# Patient Record
Sex: Female | Born: 1935 | Race: White | Hispanic: No | Smoking: Never smoker
Health system: Southern US, Community
[De-identification: ages and names within clinical notes are randomized; demographics above are authoritative.]

## PROBLEM LIST (undated history)

## (undated) DIAGNOSIS — M069 Rheumatoid arthritis, unspecified: Secondary | ICD-10-CM

## (undated) DIAGNOSIS — I340 Nonrheumatic mitral (valve) insufficiency: Secondary | ICD-10-CM

## (undated) DIAGNOSIS — I10 Essential (primary) hypertension: Secondary | ICD-10-CM

## (undated) DIAGNOSIS — I4819 Other persistent atrial fibrillation: Secondary | ICD-10-CM

## (undated) DIAGNOSIS — I5189 Other ill-defined heart diseases: Secondary | ICD-10-CM

## (undated) DIAGNOSIS — I421 Obstructive hypertrophic cardiomyopathy: Secondary | ICD-10-CM

## (undated) DIAGNOSIS — I5030 Unspecified diastolic (congestive) heart failure: Secondary | ICD-10-CM

## (undated) DIAGNOSIS — I517 Cardiomegaly: Secondary | ICD-10-CM

## (undated) DIAGNOSIS — K219 Gastro-esophageal reflux disease without esophagitis: Secondary | ICD-10-CM

## (undated) DIAGNOSIS — K829 Disease of gallbladder, unspecified: Secondary | ICD-10-CM

## (undated) DIAGNOSIS — G2581 Restless legs syndrome: Secondary | ICD-10-CM

## (undated) DIAGNOSIS — R42 Dizziness and giddiness: Secondary | ICD-10-CM

## (undated) DIAGNOSIS — K56609 Unspecified intestinal obstruction, unspecified as to partial versus complete obstruction: Secondary | ICD-10-CM

## (undated) HISTORY — DX: Essential (primary) hypertension: I10

## (undated) HISTORY — DX: Cardiomegaly: I51.7

## (undated) HISTORY — DX: Gastro-esophageal reflux disease without esophagitis: K21.9

## (undated) HISTORY — DX: Other persistent atrial fibrillation: I48.19

## (undated) HISTORY — DX: Disease of gallbladder, unspecified: K82.9

## (undated) HISTORY — DX: Dizziness and giddiness: R42

## (undated) HISTORY — PX: CHOLECYSTECTOMY: SHX55

## (undated) HISTORY — PX: APPENDECTOMY: SHX54

## (undated) HISTORY — PX: TOTAL HIP ARTHROPLASTY: SHX124

## (undated) HISTORY — DX: Nonrheumatic mitral (valve) insufficiency: I34.0

## (undated) HISTORY — DX: Unspecified diastolic (congestive) heart failure: I50.30

## (undated) HISTORY — DX: Restless legs syndrome: G25.81

## (undated) HISTORY — DX: Obstructive hypertrophic cardiomyopathy: I42.1

## (undated) HISTORY — PX: CATARACT EXTRACTION: SUR2

## (undated) HISTORY — PX: ABDOMINAL HYSTERECTOMY: SHX81

## (undated) HISTORY — DX: Other ill-defined heart diseases: I51.89

---

## 2000-01-12 ENCOUNTER — Ambulatory Visit (HOSPITAL_COMMUNITY): Admission: RE | Admit: 2000-01-12 | Discharge: 2000-01-12 | Payer: Self-pay | Admitting: Cardiology

## 2001-05-16 ENCOUNTER — Encounter: Payer: Self-pay | Admitting: Internal Medicine

## 2001-05-16 ENCOUNTER — Encounter: Admission: RE | Admit: 2001-05-16 | Discharge: 2001-05-16 | Payer: Self-pay | Admitting: Internal Medicine

## 2001-09-28 ENCOUNTER — Emergency Department (HOSPITAL_COMMUNITY): Admission: EM | Admit: 2001-09-28 | Discharge: 2001-09-29 | Payer: Self-pay | Admitting: Emergency Medicine

## 2001-09-28 ENCOUNTER — Encounter: Payer: Self-pay | Admitting: Emergency Medicine

## 2001-10-17 ENCOUNTER — Encounter: Admission: RE | Admit: 2001-10-17 | Discharge: 2001-10-17 | Payer: Self-pay | Admitting: Internal Medicine

## 2001-10-17 ENCOUNTER — Encounter: Payer: Self-pay | Admitting: Internal Medicine

## 2002-05-28 ENCOUNTER — Emergency Department (HOSPITAL_COMMUNITY): Admission: EM | Admit: 2002-05-28 | Discharge: 2002-05-28 | Payer: Self-pay | Admitting: Emergency Medicine

## 2002-06-08 ENCOUNTER — Ambulatory Visit (HOSPITAL_COMMUNITY)
Admission: RE | Admit: 2002-06-08 | Discharge: 2002-06-08 | Payer: Self-pay | Admitting: Physical Medicine and Rehabilitation

## 2002-06-08 ENCOUNTER — Encounter: Payer: Self-pay | Admitting: Physical Medicine and Rehabilitation

## 2002-06-19 ENCOUNTER — Ambulatory Visit (HOSPITAL_COMMUNITY): Admission: RE | Admit: 2002-06-19 | Discharge: 2002-06-19 | Payer: Self-pay | Admitting: Internal Medicine

## 2002-06-19 ENCOUNTER — Encounter: Payer: Self-pay | Admitting: Internal Medicine

## 2002-06-19 ENCOUNTER — Encounter (INDEPENDENT_AMBULATORY_CARE_PROVIDER_SITE_OTHER): Payer: Self-pay

## 2003-01-14 ENCOUNTER — Encounter: Payer: Self-pay | Admitting: Internal Medicine

## 2003-01-14 ENCOUNTER — Encounter: Admission: RE | Admit: 2003-01-14 | Discharge: 2003-01-14 | Payer: Self-pay | Admitting: Internal Medicine

## 2004-02-17 ENCOUNTER — Encounter: Admission: RE | Admit: 2004-02-17 | Discharge: 2004-02-17 | Payer: Self-pay | Admitting: Internal Medicine

## 2004-08-06 ENCOUNTER — Inpatient Hospital Stay (HOSPITAL_COMMUNITY): Admission: EM | Admit: 2004-08-06 | Discharge: 2004-08-12 | Payer: Self-pay | Admitting: Emergency Medicine

## 2004-08-07 ENCOUNTER — Ambulatory Visit: Payer: Self-pay | Admitting: Physical Medicine & Rehabilitation

## 2004-08-11 ENCOUNTER — Encounter: Payer: Self-pay | Admitting: Cardiovascular Disease

## 2004-08-24 ENCOUNTER — Ambulatory Visit (HOSPITAL_COMMUNITY): Admission: RE | Admit: 2004-08-24 | Discharge: 2004-08-24 | Payer: Self-pay | Admitting: Internal Medicine

## 2005-03-08 ENCOUNTER — Ambulatory Visit: Payer: Self-pay | Admitting: Gastroenterology

## 2005-03-09 ENCOUNTER — Encounter: Admission: RE | Admit: 2005-03-09 | Discharge: 2005-03-09 | Payer: Self-pay | Admitting: Internal Medicine

## 2005-03-19 ENCOUNTER — Ambulatory Visit: Payer: Self-pay | Admitting: Gastroenterology

## 2005-07-21 HISTORY — PX: US ECHOCARDIOGRAPHY: HXRAD669

## 2006-03-10 HISTORY — PX: CARDIOVASCULAR STRESS TEST: SHX262

## 2006-03-11 ENCOUNTER — Encounter: Admission: RE | Admit: 2006-03-11 | Discharge: 2006-03-11 | Payer: Self-pay | Admitting: Internal Medicine

## 2007-03-15 ENCOUNTER — Encounter: Admission: RE | Admit: 2007-03-15 | Discharge: 2007-03-15 | Payer: Self-pay | Admitting: Internal Medicine

## 2007-11-09 ENCOUNTER — Inpatient Hospital Stay (HOSPITAL_COMMUNITY): Admission: RE | Admit: 2007-11-09 | Discharge: 2007-11-12 | Payer: Self-pay | Admitting: Orthopaedic Surgery

## 2008-02-21 HISTORY — PX: US ECHOCARDIOGRAPHY: HXRAD669

## 2008-03-22 ENCOUNTER — Encounter: Admission: RE | Admit: 2008-03-22 | Discharge: 2008-03-22 | Payer: Self-pay | Admitting: Internal Medicine

## 2009-04-07 ENCOUNTER — Encounter: Admission: RE | Admit: 2009-04-07 | Discharge: 2009-04-07 | Payer: Self-pay | Admitting: Internal Medicine

## 2009-05-25 ENCOUNTER — Encounter: Admission: RE | Admit: 2009-05-25 | Discharge: 2009-05-25 | Payer: Self-pay | Admitting: Internal Medicine

## 2010-02-20 ENCOUNTER — Encounter: Payer: Self-pay | Admitting: Gastroenterology

## 2010-02-25 ENCOUNTER — Encounter (INDEPENDENT_AMBULATORY_CARE_PROVIDER_SITE_OTHER): Payer: Self-pay | Admitting: *Deleted

## 2010-03-26 ENCOUNTER — Encounter (INDEPENDENT_AMBULATORY_CARE_PROVIDER_SITE_OTHER): Payer: Self-pay | Admitting: *Deleted

## 2010-03-27 ENCOUNTER — Ambulatory Visit: Payer: Self-pay | Admitting: Gastroenterology

## 2010-04-10 ENCOUNTER — Ambulatory Visit: Payer: Self-pay | Admitting: Gastroenterology

## 2010-04-14 ENCOUNTER — Encounter: Payer: Self-pay | Admitting: Gastroenterology

## 2010-08-09 ENCOUNTER — Encounter: Payer: Self-pay | Admitting: Internal Medicine

## 2010-08-18 NOTE — Letter (Signed)
Summary: Colonoscopy Letter  [facility]  [address] [place], [place] [address]   Phone: [phone]  Fax: [phone]      February 20, 2010 MRN: [id number]   [facility] [address] [place], [place]  [address]   Dear Ms. Stacy [name],   According to your medical record, it is time for you to schedule a Colonoscopy. The American Cancer Society recommends this procedure as a method to detect early colon cancer. Patients with a family history of colon cancer, or a personal history of colon polyps or inflammatory bowel disease are at increased risk.  This letter has been generated based on the recommendations made at the time of your procedure. If you feel that in your particular situation this may no longer apply, please contact our office.  Please call our office at [phone] to schedule this appointment or to update your records at your earliest convenience.  Thank you for cooperating with [place] to provide you with the very best care possible.   Sincerely,  Rachael Fee, M.D.  [facility] Gastroenterology Division [phone]

## 2010-08-18 NOTE — Miscellaneous (Signed)
Summary: [facility] PV  Clinical Lists Changes  Medications: Added new medication of MOVIPREP 100 GM  SOLR (PEG-KCL-NACL-NASULF-NA ASC-C) As per prep instructions. - Signed Rx of MOVIPREP 100 GM  SOLR (PEG-KCL-NACL-NASULF-NA ASC-C) As per prep instructions.;  #1 x 0;  Signed;  Entered by: Durwin Glaze RN;  Authorized by: Rachael Fee MD;  Method used: Electronically to [facility] [address] [place], [place], [place]  [id number], Ph: [phone], Fax: [phone] Observations: Added new observation of NKA: T (03/27/2010 15:59)    Prescriptions: MOVIPREP 100 GM  SOLR (PEG-KCL-NACL-NASULF-NA ASC-C) As per prep instructions.  #1 x 0   Entered by:   Durwin Glaze RN   Authorized by:   Rachael Fee MD   Signed by:   Durwin Glaze RN on 03/27/2010   Method used:   Electronically to        [facility] (retail)       [address]       [place], [place]  [id number]       Ph: [phone]       Fax: [phone]   RxID:   [id number]

## 2010-08-18 NOTE — Procedures (Signed)
Summary: Colonoscopy  Patient: [name] Note: All result statuses are Final unless otherwise noted.  Tests: (1) Colonoscopy (COL)   COL Colonoscopy           DONE     [facility]     [address]     [place], [place]  [address]           COLONOSCOPY PROCEDURE REPORT           PATIENT:  Stacy [name], Stacy [name]  MR#:  [id number]     BIRTHDATE:  [birth year], 81 yrs. old  GENDER:  female     ENDOSCOPIST:  Rachael Fee, MD     PROCEDURE DATE:  04/10/2010     PROCEDURE:  Colonoscopy with snare polypectomy     ASA CLASS:  Class II     INDICATIONS:  Elevated Risk Screening, mother had colon cancer     MEDICATIONS:   Fentanyl 50 mcg IV, Versed 5 mg IV           DESCRIPTION OF PROCEDURE:   After the risks benefits and     alternatives of the procedure were thoroughly explained, informed     consent was obtained.  Digital rectal exam was performed and     revealed no rectal masses.   The LB PCF-[id number] endoscope     was introduced through the anus and advanced to the cecum, which     was identified by both the appendix and ileocecal valve, without     limitations.  The quality of the prep was adequate, using     MoviPrep.  The instrument was then slowly withdrawn as the colon     was fully examined.     <<PROCEDUREIMAGES>>     FINDINGS:  A diminutive polyp was found in the descending colon.     This was removed with cold snare and sent to pathology (jar 1)     (see image7 and image8).  Mild diverticulosis was found in the     sigmoid to descending colon segments (see image2).  External     hemorrhoids were found.  This was otherwise a normal examination     of the colon (see image4, image5, and image9).   Retroflexed views     in the rectum revealed no abnormalities.    The scope was then     withdrawn from the patient and the procedure completed.     COMPLICATIONS:  None           ENDOSCOPIC IMPRESSION:     1) Diminutive polyp in the descending colon, removed and  sent to     pathology     2) Mild diverticulosis in the sigmoid to descending colon     segments     3) External hemorrhoids     4) Otherwise normal examination           RECOMMENDATIONS:     1) Given your age, you will probably not need another     colonoscopy for colon cancer screening or polyp surveillance.     These types of tests usually stop around the age 49.     2) You will receive a letter within 1-2 weeks with the results     of your biopsy as well as final recommendations. Please call my     office if you have not received a letter after 3 weeks.           ______________________________  Rachael Fee, MD           n.     Rosalie Doctor:   Rachael Fee at 04/10/2010 11:50 AM           [place], [id number]  Note: An exclamation mark (!) indicates a result that was not dispersed into the flowsheet. Document Creation Date: 04/10/2010 11:51 AM _______________________________________________________________________  (1) Order result status: Final Collection or observation date-time: 04/10/2010 11:45 Requested date-time:  Receipt date-time:  Reported date-time:  Referring Physician:   Ordering Physician: Rob Bunting [phone]) Specimen Source:  Source: Launa Grill Order Number: [id number] Lab site:

## 2010-08-18 NOTE — Letter (Signed)
Summary: Colonoscopy Letter  [facility]  [address]   , [place] [address]   Phone: [phone]  Fax: [phone]      February 20, 2010 MRN: [id number]   [facility] [address] [place], [place]  [address]   Dear Ms. Nardelli,   According to your medical record, it is time for you to schedule a Colonoscopy. The American Cancer Society recommends this procedure as a method to detect early colon cancer. Patients with a family history of colon cancer, or a personal history of colon polyps or inflammatory bowel disease are at increased risk.  This letter has been generated based on the recommendations made at the time of your procedure. If you feel that in your particular situation this may no longer apply, please contact our office.  Please call our office at [phone] to schedule this appointment or to update your records at your earliest convenience.  Thank you for cooperating with us to provide you with the very best care possible.   Sincerely,  Daniel P. Jacobs, M.D.  [facility] Gastroenterology Division [phone] 

## 2010-08-18 NOTE — Letter (Signed)
Summary: Previsit letter  [facility]  [address] [place], [place] [address]   Phone: [phone]  Fax: [phone]       02/25/2010 MRN: [id number]  [facility] [address] [place], [place]  [address]  Dear Stacy [name],  Welcome to the Gastroenterology Division at [facility].    You are scheduled to see a nurse for your pre-procedure visit on 03-27-10 at 4:30p.m. on the 3rd floor at [facility], [address]  We ask that you try to arrive at our office 15 minutes prior to your appointment time to allow for check-in.  Your nurse visit will consist of discussing your medical and surgical history, your immediate family medical history, and your medications.    Please bring a complete list of all your medications or, if you prefer, bring the medication bottles and we will list them.  We will need to be aware of both prescribed and over the counter drugs.  We will need to know exact dosage information as well.  If you are on blood thinners (Coumadin, Plavix, Aggrenox, Ticlid, etc.) please call our office today/prior to your appointment, as we need to consult with your physician about holding your medication.   Please be prepared to read and sign documents such as consent forms, a financial agreement, and acknowledgement forms.  If necessary, and with your consent, a friend or relative is welcome to sit-in on the nurse visit with you.  Please bring your insurance card so that we may make a copy of it.  If your insurance requires a referral to see a specialist, please bring your referral form from your primary care physician.  No co-pay is required for this nurse visit.     If you cannot keep your appointment, please call [phone] to cancel or reschedule prior to your appointment date.  This allows [place] the opportunity to schedule an appointment for another patient in need of care.    Thank you for choosing [facility] for your medical needs.  We  appreciate the opportunity to care for you.  Please visit [place] at our website  to learn more about our practice.                     Sincerely.                                                                                                                   The Gastroenterology Division

## 2010-08-18 NOTE — Letter (Signed)
Summary: Results Letter  [facility]  [address] [place], [place] [address]   Phone: [phone]  Fax: [phone]        April 14, 2010 MRN: [id number]    [facility] [address] [place], [place]  [address]    Dear Stacy [name],   At least one of the polyps removed during your recent procedure was proven to be adenomatous.  Usually, routine screening/surveillance colonoscopy would be recommended to be done in 5 years (you would be 86 at that point).  However since colon cancer screening tests generally stop around age 32, I will leave it to you and your primary care physician to contact my office at that time if it is felt that colon cancer screening is still an important issue for you.  Please call if you have any questions or concerns.       Sincerely,  Rachael Fee MD  This letter has been electronically signed by your physician.  Appended Document: Results Letter letter mailed

## 2010-08-18 NOTE — Letter (Signed)
Summary: Moviprep Instructions  [facility]  [address]   [place], [place] [address]   Phone: [phone]  Fax: [phone]       Stacy [name]    19-May-1929    MRN: [id number]        Procedure Day Dorna Bloom: Friday, 04-10-10     Arrival Time: 10:30 a.m.      Procedure Time: 11:30 a.m.     Location of Procedure:                    x    Endoscopy Center (4th Floor)   PREPARATION FOR COLONOSCOPY WITH MOVIPREP   Starting 5 days prior to your procedure 04-05-10  do not eat nuts, seeds, popcorn, corn, beans, peas,  salads, or any raw vegetables.  Do not take any fiber supplements (e.g. Metamucil, Citrucel, and Benefiber).  THE DAY BEFORE YOUR PROCEDURE         DATE: 04-09-10  DAY: Thursday  1.  Drink clear liquids the entire day-NO SOLID FOOD  2.  Do not drink anything colored red or purple.  Avoid juices with pulp.  No orange juice.  3.  Drink at least 64 oz. (8 glasses) of fluid/clear liquids during the day to prevent dehydration and help the prep work efficiently.  CLEAR LIQUIDS INCLUDE: Water Jello Ice Popsicles Tea (sugar ok, no milk/cream) Powdered fruit flavored drinks Coffee (sugar ok, no milk/cream) Gatorade Juice: apple, white grape, white cranberry  Lemonade Clear bullion, consomm, broth Carbonated beverages (any kind) Strained chicken noodle soup Hard Candy                             4.  In the morning, mix first dose of MoviPrep solution:    Empty 1 Pouch A and 1 Pouch B into the disposable container    Add lukewarm drinking water to the top line of the container. Mix to dissolve    Refrigerate (mixed solution should be used within 24 hrs)  5.  Begin drinking the prep at 5:00 p.m. The MoviPrep container is divided by 4 marks.   Every 15 minutes drink the solution down to the next mark (approximately 8 oz) until the full liter is complete.   6.  Follow completed prep with 16 oz of clear liquid of your choice (Nothing red or  purple).  Continue to drink clear liquids until bedtime.  7.  Before going to bed, mix second dose of MoviPrep solution:    Empty 1 Pouch A and 1 Pouch B into the disposable container    Add lukewarm drinking water to the top line of the container. Mix to dissolve    Refrigerate  THE DAY OF YOUR PROCEDURE      DATE: 04-10-10  DAY: Friday  Beginning at 6:30 a.m. (5 hours before procedure):         1. Every 15 minutes, drink the solution down to the next mark (approx 8 oz) until the full liter is complete.  2. Follow completed prep with 16 oz. of clear liquid of your choice.    3. You may drink clear liquids until 9:30AM (2 HOURS BEFORE PROCEDURE).   MEDICATION INSTRUCTIONS  Unless otherwise instructed, you should take regular prescription medications with a small sip of water   as early as possible the morning of your procedure.  Additional medication instructions: DO NOT TAKE HCTZ ON THE DAY OF YOUR PROCEDURE.  OTHER INSTRUCTIONS  You will need a responsible adult at least 75 years of age to accompany you and drive you home.   This person must remain in the waiting room during your procedure.  Wear loose fitting clothing that is easily removed.  Leave jewelry and other valuables at home.  However, you may wish to bring a book to read or  an iPod/MP3 player to listen to music as you wait for your procedure to start.  Remove all body piercing jewelry and leave at home.  Total time from sign-in until discharge is approximately 2-3 hours.  You should go home directly after your procedure and rest.  You can resume normal activities the  day after your procedure.  The day of your procedure you should not:   Drive   Make legal decisions   Operate machinery   Drink alcohol   Return to work  You will receive specific instructions about eating, activities and medications before you leave.    The above instructions have been reviewed and explained to me by    Durwin Glaze RN  March 27, 2010 4:33 PM    I fully understand and can verbalize these instructions _____________________________ Date _________

## 2010-12-01 NOTE — Op Note (Signed)
NAMESAREN, CORKERN NO.:  [id number]   MEDICAL RECORD NO.:  [id number]          PATIENT TYPE:  INP   LOCATION:  2550                         FACILITY:  [facility]   PHYSICIAN:  Lubertha Basque. Dalldorf, M.D.DATE OF BIRTH:  [birth year]   DATE OF PROCEDURE:  11/09/2007  DATE OF DISCHARGE:                               OPERATIVE REPORT   PREOPERATIVE DIAGNOSIS:  Painful left hip hemiarthroplasty.   POSTOPERATIVE DIAGNOSIS:  Painful left hip hemiarthroplasty.   PROCEDURE:  1. Left hip revision.  2. Total hip replacement.   ANESTHESIA:  General.   ATTENDING SURGEON:  Lubertha Basque. Jerl Santos, MD   ASSISTANT:  Lindwood Qua, PA   INDICATIONS FOR PROCEDURE:  The patient is a 77 year old woman 3 years  from hemiarthroplasty for hip fracture.  She did well initially but over  the last year developed groin pain.  On x-ray, she has acetabular wear,  though the femoral component is well fixed.  She is offered conversion  to total hip replacement as she has pain, which limits her ability to  remain active and pain, which limits her ability to rest.  Informed  operative consent was obtained after discussion of possible  complications of reaction to anesthesia, infection, DVT, PE,  dislocation, and death.   SUMMARY, FINDINGS AND PROCEDURE:  Under general anesthesia through her  old posterior approach, a left hip hemiarthroplasty was uncovered.  The  femoral component was surrounded by thick scar tissue but once this was  all evacuated, the component itself appeared well fixed.  The acetabulum  did exhibit significant wear down to bone with no articular cartilage  remaining.  This was then converted to an ASR hip replacement by DePuy  using a size 52 ASR cup with a 46-mm large head with a +2 neck buildup.  Lindwood Qua assisted throughout and was invaluable to the  completion of the case in that he helped position and retract while I  performed the procedure.  He also closed  simultaneously to help minimize  the OR time.   DESCRIPTION OF THE PROCEDURE:  The patient was taken to the operating  suite where general anesthetic was applied without difficulty.  She was  positioned on lateral decubitus position with the left hip up.  All bony  prominences were appropriately padded, hip positioners were utilized,  and an axillary roll was placed.  She was prepped and draped in normal  sterile fashion.  After administration of IV Kefzol, a posterior  approach was taken to the left hip.  All appropriate anti-infective  measures were used including Betadine-impregnated drape, preoperative IV  antibiotic, and closed hooded exhaust systems for each member of the  surgical team.  I dissected down through an abundance of the adipose  tissue to the IT band and gluteus maximus fascia.  These structures were  split longitudinally to expose the short external rotators of the hip,  which were tagged and reflected.  I dissected through an abundance of  old scar tissue to expose for hemiarthroplasty component.  This appeared  well fixed in the femur.  I removed the head assembly.  We continued to  dissect around the acetabulum to remove residual labral tissues and scar  tissue.  I then reamed from 43 up to size 51 followed by trial  reductions.  We then placed a size 52 ASR cup and appropriate  anteversion and slightly vertical position as this seemed to give [place]  access to fixation.  We then placed a trial 46-head with a +2 neck and  this seemed to give [place] good stability in flexion, internal rotation, as  well as extension, and external rotation.  Leg lengths were judged, to  be roughly equal.  The trial component was removed followed by placement  of a size 46-head with a +2 sleeve.  Again, this was stable once  reduced.  The wound was thoroughly irrigated followed by reapproximation  of short external rotators to the greater trochanteric region with  nonabsorbable suture.  IT  band and gluteus maximus fascia were  reapproximated with #1 Vicryl in interrupted fashion followed by  subcutaneous reapproximation in several layers with 0 and 2-0 undyed  Vicryl.  Skin was closed with staples.  Adaptic was applied followed by  a Mepilex dressing.   ESTIMATED BLOOD LOSS:  100 mL.   INTRAOPERATIVE FLUIDS:  Obtained from anesthesia records.   DISPOSITION:  The patient was extubated in the operating room and taken  to recovery in stable addition.  She will be admitted to the Orthopedic  Surgery Service for appropriate postop care to include perioperative  antibiotics and Coumadin plus Lovenox for DVT prophylaxis.      Lubertha Basque Jerl Santos, M.D.  Electronically Signed     PGD/MEDQ  D:  11/09/2007  T:  11/10/2007  Job:  [id number]

## 2010-12-04 NOTE — Discharge Summary (Signed)
Stacy [name], Stacy [name] NO.:  [id number]   MEDICAL RECORD NO.:  [id number]          PATIENT TYPE:  INP   LOCATION:  5014                         FACILITY:  [facility]   PHYSICIAN:  Stacy [name], M.D.DATE OF BIRTH:  [birth year]   DATE OF ADMISSION:  08/06/2004  DATE OF DISCHARGE:  08/12/2004                                 DISCHARGE SUMMARY   ADMITTING DIAGNOSES:  1.  Left hip femoral neck fracture.  2.  History of hypertension.   DISCHARGE DIAGNOSES:  1.  Left hip femoral neck fracture.  2.  History of hypertension.  3.  Blood loss anemia.   BRIEF HISTORY:  Stacy [name] is a 77 year old white female who fell the day of  admission to the hospital, was unable to get up.  Was transported to the  emergency room and Dr. Ignacia [name] had called [place] to consult on her.  She had a  femoral neck fracture by x-ray.  She was noted to be a very active person,  independent ambulator.  Discussed treatment options with her, that being a  left hip cemented hemiarthroplasty.  Risks of anesthesia, infection, DVT,  and possible death were discussed with her.   PERTINENT LABORATORY AND X-RAY FINDINGS:  Chest showed some increased  basilar atelectasis.  Hip x-rays showed a subcapital femoral neck fracture  on the left side.  EKG:  Normal sinus rhythm with a first degree AV block.  Her hemoglobin was 8.3, hematocrit 24.0, platelets at 245.  Last INR 1.5.  Sodium 138, potassium 4.0, glucose at 103, BUN at 5, calcium 7.8, creatinine  0.6.  Blood cultures taken no growth.  Urine specimen did show Enterococcus  greater than 100,000 colonies.   HOSPITAL COURSE:  She was put on a morphine PCA pump, IV Ancef 1 g q.8h. x3  doses, laxative of choice, Coumadin and Lovenox protocol for DVT  prophylaxis, Phenergan for nausea, Percocet for p.o. medication.  She was  kept on Altace 10 mg one a day, Actonel one pill a week, and then folic  acid.  She also was on incentive spirometry, knee high TEDS,  Foley catheter  to straight drain, knee immobilizer on while at nighttime in bed, and then  physical therapy, occupational therapy, and a rehabilitation consult were  done.  She had follow-up CBC, BMET, and pro times while in the hospital and  she could be weightbearing as tolerated.  The first day postoperative her  blood pressure was 110/50.  Hemoglobin was 10.6, INR 1.0.  Lungs were clear  to A&P.  Abdomen was soft, positive bowel sounds.  Left hip wound was  normal.  No sign of infection.  Foley catheter was in place and her dressing  was dry.  The second day postoperative her dressing was changed.  Her wound  was benign.  No sign of irritation or infection.  Normal neurovascular  status to her toes.  Temperature was 102.8.  Blood cultures were done and  discussed in the laboratory section of this dictation as well as chest x-ray  and EKG.  Consultation was done as well from Dr. Felipa Eth  because of her  temperature elevation and thought that it possibly was from sinusitis.  During the additional hospital stay her blood pressure was 109/64, heart  rate of 82.  Temperature dropped to 98.2.  Hemoglobin 8.1.  INR was 1.2.  Lungs continued to be clear.  Cardiac S1 and S2.  Abdomen soft with positive  bowel sounds.  She was started on some ferrous sulfate 325 one a day.  Blood  loss we felt was due to surgery and from blood loss anemia.  She was  asymptomatic so we would hold on any transfusions at this time.  Her hip  wound at all times at the hospitalization was benign.  No sign of irritation  and infection and no drainage noted.  Her Foley catheter was discontinued.  She progressed well and was discharged home.   CONDITION ON DISCHARGE:  Improved.   FOLLOW-UP:  She will be on Percocet one or two every four to six hours,  Coumadin 5 mg and this is dosed as assigned by pharmacy, Altace 10 mg one a  day, Avelox 400 mg for three more days, Nu-Iron one pill a day.  She was  also prescribed home  health care for pro times and therapy for weightbearing  as tolerated.  Dressing can be changed daily.  Diet was unrestricted.  Any  sign of infection, drainage, increasing pain, redness to call [phone] and  she will follow up with [place] in about two weeks.      MC/MEDQ  D:  10/08/2004  T:  10/08/2004  Job:  [id number]

## 2010-12-04 NOTE — Consult Note (Signed)
Stacy [name], REEDER NO.:  [id number]   MEDICAL RECORD NO.:  [id number]          PATIENT TYPE:  INP   LOCATION:  5014                         FACILITY:  [facility]   PHYSICIAN:  Larina Earthly, M.D.        DATE OF BIRTH:  Nov 10, 1928   DATE OF CONSULTATION:  08/08/2004  DATE OF DISCHARGE:                                   CONSULTATION   REASON FOR CONSULTATION:  Fever and medical management of multiple issues.   HISTORY OF PRESENT ILLNESS:  This is a 77 year old Caucasian female who has  a past medical history significant for osteoporosis, hypertension, and  psoriatic arthritis followed by Dr. Jimmy Footman and Dr. Rodrigo Ran of [facility], who fell on August 06, 2004, without loss of  consciousness and suffered a left hip fracture.  She was taken to the OR by  Dr. Jerl Santos and underwent hemiarthroplasty without known complications.  This morning, the patient was noted to have fevers throughout the night with  chills, cough productive of blood tinged sputum without shortness of breath,  chest pain, nausea and vomiting, and I was called to consult on the patient  given these new acute issues.  Of note, she is on DVT prophylaxis with  Coumadin per pharmacy protocol.  Upon further questioning by myself, the  patient states that she has had a sore throat since the surgery along with  post nasal drip and rhinitis of  yellow to clear discharge.  She is  maintained on Methotrexate on a weekly basis along with her Actonel for her  osteoporosis and psoriatic arthritis.  She does not have any history of  smoking or tobacco abuse.  Other review of systems is negative for nausea,  vomiting, diarrhea, change in bowel habits, blood per rectum, or urine.  She  denies any suprapubic discomfort with Foley catheter in place and states  that her pain is adequately controlled.  The patient states that since  receiving Tylenol and Percocet this morning, the patient's chills  have  resolved and she is feeling quite better compared with earlier this morning  when evaluated by orthopedic surgery.   PROBLEM LIST:  1.  Hypertension.  2.  Psoriatic arthritis.  3.  Osteoporosis.   SOCIAL HISTORY:  The patient lives with her husband and is supported by a  daughter who lives in town, as well.  She denies any tobacco or alcohol  abuse and works as a Lawyer.   FAMILY HISTORY:  Noncontributory at this point.   CURRENT MEDICATIONS:  Actonel, Altace 10 mg daily, hydrochlorothiazide 25 mg  daily, Premarin 0.3 mg daily, methotrexate q. Saturday night, and folic acid  daily.   ALLERGIES:  The patient has no known drug allergies.   LABORATORY DATA:  White blood cell count 8.6, unchanged from prior to  hospitalization, hemoglobin 9.9 decreased from 12.6 prior to surgery,  hematocrit 29.2, platelet count 164.  Sodium 131, potassium 3.4, BUN 10,  creatinine 0.7, serum bicarb 28, glucose 117.  Blood cultures x 2 pending.  Urine culture and urinalysis pending.  Portable chest x-ray  pending,  however, preoperative chest x-ray was unremarkable for any acute disease.   PHYSICAL EXAMINATION:  GENERAL:  Pleasant Caucasian female sitting in a chair in no apparent  distress, answered all questions appropriately, alert and oriented x 3, in  no respiratory distress.  VITAL SIGNS:  Blood pressure 124/63, heart rate 95, respirations 20 and  nonlabored, temperature previously 102.8 degrees this morning, oxygen  saturation 94% on 2 liters by nasal cannula.  HEENT:  Sclerae anicteric.  Extraocular movements intact.  There are no  oropharyngeal lesions with the exception of some post nasal drip.  NECK:  Supple, there is no sinus tenderness, there is no cervical  lymphadenopathy.  LUNGS:  Coarse breath sounds at the bases but are otherwise clear.  There is  a notable murmur at the left upper back region.  HEART:  Regular rate and rhythm with a harsh systolic murmur.   ABDOMEN:  Soft, nontender, nondistended abdomen, bowel sounds present, an  audible bruit is also present possibly emanating from the chest.  EXTREMITIES:  No edema, pedal pulses are intact.   ASSESSMENT/PLAN:  1.  Fever.  Given symptomatology of post nasal drip and sore throat and      sinus pressure, this certainly may be sinusitis, will treat empirically      with mucolytic agents and Avelox, will follow up on chest x-ray and      blood cultures.  Please note that the white blood cell count is normal      and stable at this point.  2.  Hypertension, stable and controlled.  3.  Anemia, hemoglobin 9.9, not in need of transfusion at this time but will      continue to monitor status post surgery, will presume that this is      secondary to blood loss from the surgery.  4.  Psoriatic arthritis, stable on methotrexate and folic acid.  5.  Heart murmur with radiation to the back and abdomen.  After extensive      discussion with the family and patient, this has been present in the      past and has been evaluated by Dr. Judie Grieve and Dr. Waynard Edwards with a history      of an echocardiogram within the last few years, will defer management to      Dr. Waynard Edwards, if additional evaluation is needed in the near future.  6.  The patient is on DVT prophylaxis.      RA/MEDQ  D:  08/08/2004  T:  08/08/2004  Job:  [id number]   cc:   Lubertha Basque. Jerl Santos, M.D.  [address]  [place]  [place] [address]  Fax: [phone]   Redge Gainer. Waynard Edwards, M.D.  [address]  [place]  [place] [address]  Fax: [phone]

## 2010-12-04 NOTE — Op Note (Signed)
NAMEMONZERAT, HANDLER NO.:  [id number]   MEDICAL RECORD NO.:  [id number]          PATIENT TYPE:  INP   LOCATION:  1826                         FACILITY:  [facility]   PHYSICIAN:  Lubertha Basque. Dalldorf, M.D.DATE OF BIRTH:  01-17-1929   DATE OF PROCEDURE:  08/06/2004  DATE OF DISCHARGE:                                 OPERATIVE REPORT   PREOPERATIVE DIAGNOSIS:  Left hip displaced femoral neck fracture.   POSTOPERATIVE DIAGNOSIS:  Left hip displaced femoral neck fracture.   PROCEDURE:  Left hip hemiarthroplasty.   ANESTHESIA:  General.   ATTENDING SURGEON:  Lubertha Basque. Jerl Santos, M.D.   ASSISTANT:  Lindwood Qua, P.A.   INDICATION FOR PROCEDURE:  The patient is a 77 year old woman who has is  independent-liver and ambulator who fell today and fractured her left  femoral neck. She is offered hemiarthroplasty in hopes of regaining some  mobility. Informed operative consent was obtained after discussion of  possible complications of reaction to anesthesia, infection, DVT,  dislocation, PE, and death.   DESCRIPTION OF PROCEDURE:  The the patient was taken to the operating suite  where general anesthetic was applied without difficulty. She positioned in a  lateral decubitus position. All bony prominences were appropriately padded,  an axillary roll placed, and hip positioners utilized. She was then prepped  and draped in normal sterile fashion. After the administration with preop IV  antibiotics, a posterior approach was taken to the left hip. Dissection was  carried through a moderate amount of adipose tissue to expose the IT band  and gluteus maximus fascia, which were incised longitudinally. This exposed  the short external rotators of the hip, which were tagged and reflected. A  partial posterior capsulectomy was performed. A revision femoral neck cut  was made and the femoral head was removed. The head sized to a 47. The femur  was exposed and broached up to size 3  after appropriate reaming. A trial  reduction was done with several components and the -347 assembly seemed to  be best. This was stable in extension with external rotation and flexion.  With internal rotation, the leg lengths were judged to be roughly equal. The  trial components removed followed by placement of a distal cement plug, size  3, by [employer]. The canal was thoroughly irrigated with pulsatile lavage,  followed by pressurization of cement and placement of a size 3 DePuy Excel  femoral stem in appropriate anteversion. Excess cement was trimmed and  pressure was held on the component until the cement hardened completely.  This was then topped with a 47 -3 unipolar head assembly. The hip again was  reduced and was stable in extension with external rotation and flexion with  internal rotation. The capsule and short external rotators of the hip were  reapproximated to the greater trochanteric region with nonabsorbable suture.  The wound was irrigated several times, followed by reapproximation of fascia  lata and IT band in interrupted fashion with #1 Vicryl. Subcutaneous tissues  were reapproximated in 3 layers with Vicryl, followed by skin closure with  staples. Adaptic was placed on the wound,  followed by dry gauze and tape.  Estimated blood loss and intraoperative fluids can be obtained from  anesthesia records.   DISPOSITION:  The patient was extubated in the operating room and taken to  recovery in stable addition. Plans were for her to be admitted to the  orthopedic surgery service for appropriate postop care to include  perioperative antibiotics and Coumadin plus Lovenox for DVT prophylaxis.      PGD/MEDQ  D:  08/06/2004  T:  08/07/2004  Job:  [id number]

## 2010-12-04 NOTE — Discharge Summary (Signed)
Stacy [name], HORAN NO.:  [id number]   MEDICAL RECORD NO.:  [id number]          PATIENT TYPE:  INP   LOCATION:  5004                         FACILITY:  [facility]   PHYSICIAN:  Stacy Basque. Dalldorf, M.D.DATE OF BIRTH:  05/14/1929   DATE OF ADMISSION:  11/09/2007  DATE OF DISCHARGE:  11/12/2007                               DISCHARGE SUMMARY   ADMITTING DIAGNOSES:  1. Left hip hemiarthroplasty with painful acetabulum.  2. Hypertension.  3. Esophageal reflux.   DISCHARGE DIAGNOSES:  1. Left hip hemiarthroplasty with painful acetabulum.  2. Hypertension.  3. Esophageal reflux.   OPERATION:  Conversion of the left hemiarthroplasty hip to total hip  replacement and acetabular component replacement.   BRIEF HISTORY:  Stacy [name] is a 77 year old white female patient well-  known to our practice who a number of years ago had suffered a hip  fracture and underwent a hemiarthroplasty on the left side.  During the  last number of months, she has had increasing pain in the groin and x-  rays reveal acetabular wear.  We have discussed converting her to a  total hip replacement which would be revising her hip arthroplasty with  an acetabular component.   PERTINENT LABORATORY AND X-RAY FINDINGS:  EKG sinus bradycardia.  WBC  7.7, hemoglobin 9.3, hematocrit 27.8, platelets 169.  Serial INRs were  done as she was on low-dose Coumadin protocol.  Sodium 136, potassium  3.7, glucose 108, BUN 10 and creatinine 0.65.   COURSE IN THE HOSPITAL:  She was admitted postoperatively, placed on  variety of p.o. and IM analgesics for pain.  PCA pump was used for DVT  prophylaxis with help of pharmacy for Coumadin and Lovenox, IV Ancef 1 g  q.8 h. x3 doses and then appropriate pain medications by mouth, iron  supplementation, antiemetics, knee-high TEDs, incentive spirometry,  Foley catheter used for first 24 hours and discontinued.  Physical  therapy, she can be out of bed, weightbearing as  tolerated with a  therapy and OT consults as well. The first day postop, blood pressure  107/53, temperature 97, hemoglobin 10.8, INR 1.0.  Positive breath  sounds in all fields.  Abdomen, soft.  Left hip wound within normal  limits as infection.  Foley catheter was discontinued.  IV was changed  to heparin lock and on the second day postop, dressing was changed.  The  wound was noted to be benign.  IVs were discontinued.  She progressed  well, showed no significant difficulties while in the hospital and was  discharged home.   CONDITION ON DISCHARGE:  Improved.   FOLLOW UP:  She will remain on Exforge 5/160 1 mg a day, omeprazole 20  mg a day, folic acid, methotrexate 2.5 mg a day, vitamin D 50,000 units  a day, Actonel 35 mg weekly.  Prescription for pain medications is  given.  Percocet one every 4 to 6 p.r.n. pain and Coumadin as directed  by pharmacy.  She could change her dressing on a daily basis.  Eat a low-  sodium heart-healthy diet.  Home physical therapy would be provided  along with home draws  for her INRs.  She will return and that is provided by [place]  She  will return to see Dr. Jerl [name] in 7-10 days days, calling [phone] for  an appointment in the office and then the same number if there is any  sign of infection, increasing redness, drainage, pain.      Stacy [name], P.A.      Stacy [name], M.D.  Electronically Signed    MC/MEDQ  D:  11/30/2007  T:  12/01/2007  Job:  [id number]

## 2010-12-15 ENCOUNTER — Ambulatory Visit (INDEPENDENT_AMBULATORY_CARE_PROVIDER_SITE_OTHER): Payer: Medicare Other | Admitting: Gastroenterology

## 2010-12-15 ENCOUNTER — Other Ambulatory Visit (INDEPENDENT_AMBULATORY_CARE_PROVIDER_SITE_OTHER): Payer: Medicare Other

## 2010-12-15 ENCOUNTER — Encounter: Payer: Self-pay | Admitting: Gastroenterology

## 2010-12-15 ENCOUNTER — Ambulatory Visit
Admission: RE | Admit: 2010-12-15 | Discharge: 2010-12-15 | Disposition: A | Discharge status: Home / Self Care | Payer: Medicare Other | Source: Ambulatory Visit | Attending: Internal Medicine | Admitting: Internal Medicine

## 2010-12-15 ENCOUNTER — Other Ambulatory Visit: Payer: Self-pay | Admitting: Internal Medicine

## 2010-12-15 VITALS — BP 120/48 | HR 68 | Ht 62.0 in | Wt 149.0 lb

## 2010-12-15 DIAGNOSIS — E041 Nontoxic single thyroid nodule: Secondary | ICD-10-CM

## 2010-12-15 DIAGNOSIS — R197 Diarrhea, unspecified: Secondary | ICD-10-CM

## 2010-12-15 LAB — CBC WITH DIFFERENTIAL/PLATELET
Basophils Relative: 0.6 % (ref 0.0–3.0)
Eosinophils Absolute: 0.1 10*3/uL (ref 0.0–0.7)
MCHC: 34 g/dL (ref 30.0–36.0)
MCV: 90.8 fl (ref 78.0–100.0)
Monocytes Absolute: 0.5 10*3/uL (ref 0.1–1.0)
Neutrophils Relative %: 40.4 % — ABNORMAL LOW (ref 43.0–77.0)
Platelets: 178 10*3/uL (ref 150.0–400.0)
RBC: 3.79 Mil/uL — ABNORMAL LOW (ref 3.87–5.11)

## 2010-12-15 LAB — COMPREHENSIVE METABOLIC PANEL
AST: 20 U/L (ref 0–37)
Albumin: 3.9 g/dL (ref 3.5–5.2)
Alkaline Phosphatase: 50 U/L (ref 39–117)
BUN: 22 mg/dL (ref 6–23)
Potassium: 4.3 mEq/L (ref 3.5–5.1)
Total Bilirubin: 0.4 mg/dL (ref 0.3–1.2)

## 2010-12-15 MED ORDER — METRONIDAZOLE 250 MG PO TABS: 250.0000 mg | ORAL_TABLET | Freq: Three times a day (TID) | ORAL | Status: AC

## 2010-12-15 NOTE — Patient Instructions (Addendum)
Trial of antibiotics, flagyl (take one pill three times a day) called into your pharmacy. You will have labs checked today in the basement lab.  Please head down after you check out with the front desk  (c diff by pcr, stool for ova parasites, fecal leuks, routine culture, CBC, CMET). A copy of this information will be made available to Dr. Waynard Edwards. Please call Dr. Christella Hartigan' office if you are not improving within 3-4 days of starting the abx.

## 2010-12-15 NOTE — Progress Notes (Signed)
Review of pertinent gastrointestinal problems: 1. family history of colon cancer, mother 2. personal history of adenomatous colon polyps, colonoscopy September 2011. Also noted diverticulosis, hemorrhoids, small tubular adenoma. No recommended surveillance colonoscopy given her age, would have been 87 at the time of repeat colonoscopy  HPI: This is a  very pleasant 77 year old woman whom I last saw the time of a colonoscopy about a year ago. See those results summarized above.  She feels like she has IBS.  She has loose stools, usually 4 per day.  Generally after eating.  No overt bleeding.  The loose stools started 3-4 weeks ago.   She has had nocturnal diarrhea as well.  She takes no nsaids.  She takes prilosec (for many years) for GERD related problems.  Uncomfortable in lower abd, until she has  A BM.  No abx recently.  No NH visits, no sick contacts.  Very gassy lately.    Review of systems: Pertinent positive and negative review of systems were noted in the above HPI section.  All other review of systems was otherwise negative.   Past Medical History, Past Surgical History, Family History, Social History, Current Medications, Allergies were all reviewed with the patient via [facility] electronic medical record system.   Physical Exam: BP 120/48  Pulse 68  Ht 5\' 2"  (1.575 m)  Wt 149 lb (67.586 kg)  BMI 27.25 kg/m2 Constitutional: generally well-appearing Psychiatric: alert and oriented x3 Eyes: extraocular movements intact Mouth: oral pharynx moist, no lesions Neck: supple no lymphadenopathy Cardiovascular: heart regular rate and rhythm Lungs: clear to auscultation bilaterally Abdomen: soft, nontender, nondistended, no obvious ascites, no peritoneal signs, normal bowel sounds Extremities: no lower extremity edema bilaterally Skin: no lesions on visible extremities    Assessment and plan: 77 y.o. female with change in bowel habits, acute diarrhea  She has had loose  stools, several a day including nocturnally for 3-4 weeks. She has no risk factors for Clostridium difficile. She's going to get a set of labs today including stool tests, CBC, complete metabolic profile. I am going to put her empirically on metronidazole 3 times daily. She is to call if she is not improving after 3-4 days.

## 2010-12-16 ENCOUNTER — Other Ambulatory Visit: Payer: Medicare Other

## 2010-12-16 DIAGNOSIS — R197 Diarrhea, unspecified: Secondary | ICD-10-CM

## 2010-12-17 LAB — OVA AND PARASITE SCREEN: OP: NONE SEEN

## 2010-12-20 LAB — STOOL CULTURE

## 2010-12-23 ENCOUNTER — Other Ambulatory Visit: Payer: Self-pay | Admitting: Internal Medicine

## 2010-12-23 DIAGNOSIS — E041 Nontoxic single thyroid nodule: Secondary | ICD-10-CM

## 2010-12-29 ENCOUNTER — Ambulatory Visit
Admission: RE | Admit: 2010-12-29 | Discharge: 2010-12-29 | Disposition: A | Discharge status: Home / Self Care | Payer: Medicare Other | Source: Ambulatory Visit | Attending: Internal Medicine | Admitting: Internal Medicine

## 2010-12-29 ENCOUNTER — Other Ambulatory Visit: Payer: Self-pay | Admitting: Internal Medicine

## 2010-12-29 ENCOUNTER — Other Ambulatory Visit (HOSPITAL_COMMUNITY)
Admission: RE | Admit: 2010-12-29 | Discharge: 2010-12-29 | Disposition: A | Discharge status: Home / Self Care | Payer: Medicare Other | Source: Ambulatory Visit | Attending: Interventional Radiology | Admitting: Interventional Radiology

## 2010-12-29 ENCOUNTER — Other Ambulatory Visit: Payer: Self-pay | Admitting: Interventional Radiology

## 2010-12-29 DIAGNOSIS — E041 Nontoxic single thyroid nodule: Secondary | ICD-10-CM

## 2010-12-29 DIAGNOSIS — E049 Nontoxic goiter, unspecified: Secondary | ICD-10-CM | POA: Insufficient documentation

## 2011-01-14 ENCOUNTER — Ambulatory Visit
Admission: RE | Admit: 2011-01-14 | Discharge: 2011-01-14 | Disposition: A | Discharge status: Home / Self Care | Payer: Medicare Other | Source: Ambulatory Visit | Attending: Internal Medicine | Admitting: Internal Medicine

## 2011-01-14 ENCOUNTER — Other Ambulatory Visit: Payer: Self-pay | Admitting: Internal Medicine

## 2011-01-14 DIAGNOSIS — R221 Localized swelling, mass and lump, neck: Secondary | ICD-10-CM

## 2011-03-29 ENCOUNTER — Ambulatory Visit (INDEPENDENT_AMBULATORY_CARE_PROVIDER_SITE_OTHER): Payer: Medicare Other | Admitting: *Deleted

## 2011-03-29 DIAGNOSIS — M79609 Pain in unspecified limb: Secondary | ICD-10-CM

## 2011-04-13 LAB — BASIC METABOLIC PANEL
BUN: 10
BUN: 15
BUN: 19
CO2: 29
Calcium: 8.1 — ABNORMAL LOW
Calcium: 8.4
Calcium: 8.7
Chloride: 103
Chloride: 104
Creatinine, Ser: 0.65
Creatinine, Ser: 0.75
Creatinine, Ser: 0.86
GFR calc Af Amer: >60
GFR calc Af Amer: >60
GFR calc non Af Amer: >60
GFR calc non Af Amer: >60
GFR calc non Af Amer: >60
Glucose, Bld: 108 — ABNORMAL HIGH
Glucose, Bld: 143 — ABNORMAL HIGH
Sodium: 136

## 2011-04-13 LAB — BODY FLUID CULTURE
Culture: NO GROWTH
Gram Stain: NONE SEEN

## 2011-04-13 LAB — CBC
Hemoglobin: 10.8 — ABNORMAL LOW
MCHC: 33.9
MCHC: 34.5
MCV: 92.4
MCV: 93.1
Platelets: 169
Platelets: 171
Platelets: 214
Platelets: 216
RBC: 3.88
RDW: 14.5
RDW: 14.6
WBC: 6.3
WBC: 7.1
WBC: 7.7

## 2011-04-13 LAB — PROTIME-INR
INR: 1
INR: 1
INR: 1.3
Prothrombin Time: 12.8
Prothrombin Time: 13.8

## 2011-04-13 LAB — ANAEROBIC CULTURE: Gram Stain: NONE SEEN

## 2011-04-14 NOTE — Procedures (Unsigned)
DUPLEX DEEP VENOUS EXAM - LOWER EXTREMITY  INDICATION:  Right lower extremity pain.  HISTORY:  Edema:  No. Trauma/Surgery: Pain:  Yes, lateral calf. PE:  No. Previous DVT:  No. Anticoagulants:  No. Other:  DUPLEX EXAM:               CFV   SFV   PopV  PTV    GSV               R  L  R  L  R  L  R   L  R  L Thrombosis    o  o  o     o     o      o Spontaneous   +  +  +     +     +      + Phasic        +  +  +     +     +      + Augmentation  +  +  +     +     +      + Compressible  +  +  +     +     +      + Competent     +  +  +     +     +      +  Legend:  + - yes  o - no  p - partial  D - decreased  IMPRESSION: 1. No evidence of deep venous thrombosis was noted. 2. Preliminary results were called to [facility] K[facility] Drinkard.   _____________________________ Di Kindle. Edilia Bo, M.D.  EM/MEDQ  D:  03/29/2011  T:  03/29/2011  Job:  [id number]

## 2011-05-11 ENCOUNTER — Other Ambulatory Visit: Payer: Self-pay | Admitting: *Deleted

## 2011-05-11 MED ORDER — METOPROLOL SUCCINATE ER 50 MG PO TB24: 25.0000 mg | ORAL_TABLET | Freq: Two times a day (BID) | ORAL | Status: DC

## 2011-05-11 NOTE — Telephone Encounter (Signed)
Fax Received. Refill Completed. Stacy [name] (M.A). Pt needs appointment then refill can be made  

## 2011-05-27 DIAGNOSIS — Z96649 Presence of unspecified artificial hip joint: Secondary | ICD-10-CM | POA: Insufficient documentation

## 2011-06-01 ENCOUNTER — Encounter: Payer: Self-pay | Admitting: Cardiovascular Disease

## 2011-06-02 ENCOUNTER — Ambulatory Visit (INDEPENDENT_AMBULATORY_CARE_PROVIDER_SITE_OTHER): Payer: Medicare Other | Admitting: Cardiovascular Disease

## 2011-06-02 ENCOUNTER — Encounter: Payer: Self-pay | Admitting: Cardiovascular Disease

## 2011-06-02 DIAGNOSIS — E785 Hyperlipidemia, unspecified: Secondary | ICD-10-CM

## 2011-06-02 DIAGNOSIS — I10 Essential (primary) hypertension: Secondary | ICD-10-CM

## 2011-06-02 DIAGNOSIS — I517 Cardiomegaly: Secondary | ICD-10-CM

## 2011-06-02 DIAGNOSIS — I119 Hypertensive heart disease without heart failure: Secondary | ICD-10-CM | POA: Insufficient documentation

## 2011-06-02 NOTE — Assessment & Plan Note (Addendum)
Her blood pressure remains well controlled. She seems to be doing fine on the low-dose Toprol and Exforge.  I'll see her again in one year.

## 2011-06-02 NOTE — Progress Notes (Signed)
  Tyson Babinski Date of Birth  Jul 17, 1929 [facility] [address]    [address] [place], [place]  [address] [phone]  Fax  [phone]  History of Present Illness:  Stacy [name] is doing fairly well from a cardiac standpoint. She's not had any cardiac complaints. She was found to have hyperlipidemia recently and started on simvastatin.  Current Outpatient Prescriptions on File Prior to Visit  Medication Sig Dispense Refill  . amLODipine-valsartan (EXFORGE) 5-160 MG per tablet Take 1 tablet by mouth daily.        . ergocalciferol (VITAMIN D2) 50000 UNITS capsule Take 50,000 Units by mouth once a week.        . fish oil-omega-3 fatty acids 1000 MG capsule Take 2 g by mouth daily. 4 per day        . folic acid (FOLVITE) 1 MG tablet Take 1 mg by mouth 3 (three) times daily.       . hydrochlorothiazide 25 MG tablet Take 25 mg by mouth daily.        . methotrexate (RHEUMATREX) 2.5 MG tablet Take 2.5 mg by mouth once a week. Caution:Chemotherapy. Protect from light.       . metoprolol (TOPROL-XL) 50 MG 24 hr tablet Take 0.5 tablets (25 mg total) by mouth 2 (two) times daily.  60 tablet  0  . omeprazole (PRILOSEC) 20 MG capsule Take 20 mg by mouth daily.        . simvastatin (ZOCOR) 20 MG tablet Take 20 mg by mouth at bedtime.          No Known Allergies  Past Medical History  Diagnosis Date  . Hypertension   . GERD (gastroesophageal reflux disease)   . LVH (left ventricular hypertrophy)     WITH ASYMMETRIC SEPTAL HYPERTROPHY  . Diastolic dysfunction   . Mitral regurgitation   . Gallbladder disease     Past Surgical History  Procedure Date  . Total hip arthroplasty   . US echocardiography 02/21/2008    EF 55-60%  . US echocardiography 07/21/2005    EF 55-60%  . Cardiovascular stress test 03/10/2006    EF 69%. NO EVIDENCE OF ISCHEMIA    History  Smoking status  . Never Smoker   Smokeless tobacco  . Not on file    History  Alcohol Use No    Family History    Problem Relation Age of Onset  . Hypertension Mother   . Stroke Mother   . Leukemia Father   . Heart attack Brother     Reviw of Systems:  Reviewed in the HPI.  All other systems are negative.  Physical Exam: BP 140/68  Pulse 64  Resp 18  Ht 5\' 2"  (1.575 m)  Wt 151 lb (68.493 kg)  BMI 27.62 kg/m2 The patient is alert and oriented x 3.  The mood and affect are normal.   Skin: warm and dry.  Color is normal.    HEENT:    Normocephalic/atraumatic. Her carotids ar normal. Her neck is supple. There is no JVD.  Lungs: Her lung exam is clear.   Heart: Regular, S1-S2. She has a 2/6 systolic ejection murmur at the left sternal border.    Abdomen: Shows good bowel sounds. There is no hepatosplenomegaly.  Extremities:  No clubbing cyanosis or edema.  Neuro:  Her neuro exam is nonfocal.    ECG: Sinus bradycardia with a first degree AV block. Otherwise her EKG is normal.  Assessment / Plan:

## 2011-06-02 NOTE — Assessment & Plan Note (Signed)
She has a mild left ventricular outflow tract obstruction. This is being treated fairly well with Toprol. She does still have a soft murmur. She's not having any symptoms.

## 2011-06-02 NOTE — Assessment & Plan Note (Signed)
She was recently found to have hypercholesterolemia. She started on simvastatin. There is some potential interaction between simvastatin and amlodipine. I would be inclined to put her on atorvastatin which has fewer drug/drug interactions. Will discuss this issue again when I see her again in 6 months.

## 2011-06-02 NOTE — Patient Instructions (Addendum)
Your physician wants you to follow-up in: 12 months You will receive a reminder letter in the mail two months in advance. If you don't receive a letter, please call our office to schedule the follow-up appointment.  Your physician recommends that you return for a FASTING lipid profile: 12 months

## 2011-06-28 DIAGNOSIS — M25519 Pain in unspecified shoulder: Secondary | ICD-10-CM | POA: Insufficient documentation

## 2011-06-28 DIAGNOSIS — M75100 Unspecified rotator cuff tear or rupture of unspecified shoulder, not specified as traumatic: Secondary | ICD-10-CM | POA: Insufficient documentation

## 2011-07-15 ENCOUNTER — Other Ambulatory Visit: Payer: Self-pay | Admitting: *Deleted

## 2011-07-15 MED ORDER — METOPROLOL SUCCINATE ER 50 MG PO TB24: 25.0000 mg | ORAL_TABLET | Freq: Two times a day (BID) | ORAL | Status: DC

## 2011-08-27 ENCOUNTER — Ambulatory Visit (INDEPENDENT_AMBULATORY_CARE_PROVIDER_SITE_OTHER): Payer: Medicare Other | Admitting: Cardiovascular Disease

## 2011-08-27 ENCOUNTER — Encounter: Payer: Self-pay | Admitting: Cardiovascular Disease

## 2011-08-27 DIAGNOSIS — I10 Essential (primary) hypertension: Secondary | ICD-10-CM

## 2011-08-27 DIAGNOSIS — I517 Cardiomegaly: Secondary | ICD-10-CM

## 2011-08-27 DIAGNOSIS — E785 Hyperlipidemia, unspecified: Secondary | ICD-10-CM

## 2011-08-27 MED ORDER — ATORVASTATIN CALCIUM 10 MG PO TABS: 10.0000 mg | ORAL_TABLET | Freq: Every day | ORAL | Status: DC

## 2011-08-27 MED ORDER — METOPROLOL SUCCINATE ER 50 MG PO TB24: 25.0000 mg | ORAL_TABLET | Freq: Two times a day (BID) | ORAL | Status: DC

## 2011-08-27 NOTE — Assessment & Plan Note (Signed)
We will discontinue her simvastatin and start her on atorvastatin 10 mg a day. We'll check a fasting lipids again in 3 months and again in 6 months we'll see her again for an office visit.

## 2011-08-27 NOTE — Assessment & Plan Note (Signed)
She has a very slight mid LVOT obstruction. This is stable. Her pulses are quite good. We'll continue with the same dose of metoprolol.

## 2011-08-27 NOTE — Patient Instructions (Signed)
Your physician wants you to follow-up in: 6 MONTHS You will receive a reminder letter in the mail two months in advance. If you don't receive a letter, please call our office to schedule the follow-up appointment.   Your physician recommends that you return for a FASTING lipid profile: 3 MONTHS   Your physician has recommended you make the following change in your medication:   STOP SIMVASTATIN START ATORVASTATIN

## 2011-08-27 NOTE — Assessment & Plan Note (Signed)
Her blood pressure is fairly well controlled.

## 2011-08-27 NOTE — Progress Notes (Signed)
Tyson Babinski Date of Birth  08/08/1928 [facility]     [place] Office  [address]    [address]   [address] [place], [place]  [address]    [place], [place]  [address] [phone]  Fax  [phone]  [phone]  Fax [phone]  Problems: 1. 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 Hyperlipidemia 3. Mitral regurgitation   History of Present Illness:  Mrs. Baldridge is done very well from a cardiac standpoint. She's not had any episodes of chest pain or shortness of breath. She's had a cough and bronchitis for the past several weeks. She denies any syncope or presyncope. She denies any chest pain.  Current Outpatient Prescriptions on File Prior to Visit  Medication Sig Dispense Refill  . amLODipine-valsartan (EXFORGE) 5-160 MG per tablet Take 1 tablet by mouth daily.        . ergocalciferol (VITAMIN D2) 50000 UNITS capsule Take 50,000 Units by mouth once a week.        . fish oil-omega-3 fatty acids 1000 MG capsule Take 2 g by mouth daily. 4 per day        . folic acid (FOLVITE) 1 MG tablet Take 1 mg by mouth 3 (three) times daily.       . hydrochlorothiazide 25 MG tablet Take 25 mg by mouth daily.        . methotrexate (RHEUMATREX) 2.5 MG tablet Take 2.5 mg by mouth once a week. Caution:Chemotherapy. Protect from light.       . metoprolol (TOPROL-XL) 50 MG 24 hr tablet Take 0.5 tablets (25 mg total) by mouth 2 (two) times daily.  60 tablet  6  . omeprazole (PRILOSEC) 20 MG capsule Take 20 mg by mouth daily.        . simvastatin (ZOCOR) 20 MG tablet Take 20 mg by mouth at bedtime.          No Known Allergies  Past Medical History  Diagnosis Date  . Hypertension   . GERD (gastroesophageal reflux disease)   . LVH (left ventricular hypertrophy)     WITH ASYMMETRIC SEPTAL HYPERTROPHY  . Diastolic dysfunction   . Mitral regurgitation   . Gallbladder disease     Past Surgical History  Procedure Date  . Total hip arthroplasty   .  US echocardiography 02/21/2008    EF 55-60%  . US echocardiography 07/21/2005    EF 55-60%  . Cardiovascular stress test 03/10/2006    EF 69%. NO EVIDENCE OF ISCHEMIA    History  Smoking status  . Never Smoker   Smokeless tobacco  . Not on file    History  Alcohol Use No    Family History  Problem Relation Age of Onset  . Hypertension Mother   . Stroke Mother   . Leukemia Father   . Heart attack Brother     Reviw of Systems:  Reviewed in the HPI.  All other systems are negative.  Physical Exam: Blood pressure 148/78, pulse 65, height 5' 2.5" (1.588 m), weight 148 lb 6.4 oz (67.314 kg). General: Well developed, well nourished, in no acute distress.  Head: Normocephalic, atraumatic, sclera non-icteric, mucus membranes are moist,   Neck: Supple. Negative for carotid bruits. JVD not elevated.  Lungs: Clear bilaterally to auscultation without wheezes, rales, or rhonchi. Breathing is unlabored.  Heart: RRR with S1 S2. There is a 2/6 systolic murmur.  Abdomen: Soft, non-tender, non-distended with normoactive bowel sounds. No hepatomegaly. No rebound/guarding.  No obvious abdominal masses.  Msk:  Strength and tone appear normal for age.  Extremities: No clubbing or cyanosis. No edema.  Distal pedal pulses are 2+ and equal bilaterally.  Neuro: Alert and oriented X 3. Moves all extremities spontaneously.  Psych:  Responds to questions appropriately with a normal affect.  ECG:  Assessment / Plan:

## 2011-11-24 ENCOUNTER — Other Ambulatory Visit: Payer: Medicare Other

## 2012-03-02 ENCOUNTER — Other Ambulatory Visit: Payer: Medicare Other

## 2012-03-02 ENCOUNTER — Ambulatory Visit: Payer: Medicare Other | Admitting: Cardiovascular Disease

## 2012-05-02 ENCOUNTER — Ambulatory Visit (INDEPENDENT_AMBULATORY_CARE_PROVIDER_SITE_OTHER): Payer: Medicare Other | Admitting: Cardiovascular Disease

## 2012-05-02 ENCOUNTER — Encounter: Payer: Self-pay | Admitting: Cardiovascular Disease

## 2012-05-02 ENCOUNTER — Other Ambulatory Visit (INDEPENDENT_AMBULATORY_CARE_PROVIDER_SITE_OTHER): Payer: Medicare Other

## 2012-05-02 VITALS — BP 140/76 | HR 56 | Ht 62.5 in | Wt 150.4 lb

## 2012-05-02 DIAGNOSIS — E785 Hyperlipidemia, unspecified: Secondary | ICD-10-CM

## 2012-05-02 DIAGNOSIS — I10 Essential (primary) hypertension: Secondary | ICD-10-CM

## 2012-05-02 DIAGNOSIS — R0989 Other specified symptoms and signs involving the circulatory and respiratory systems: Secondary | ICD-10-CM

## 2012-05-02 NOTE — Assessment & Plan Note (Signed)
She's had her lipid levels drawn by her medical doctor this past month. I'll see her again in 6 months. We'll check her fasting lipid profile, hepatic profile, and basic metabolic profile at that time.

## 2012-05-02 NOTE — Progress Notes (Signed)
Tyson Babinski Date of Birth  September 11, 1928 [facility]     [place] Office  [address]    [address]   [address] [place], [place]  [address]    [place], [place]  [address] [phone]  Fax  [phone]  [phone]  Fax [phone]  Problems: 1. 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 Hyperlipidemia 3. Mitral regurgitation   History of Present Illness:  Mrs. Paulick is done very well from a cardiac standpoint. She's not had any episodes of chest pain or shortness of breath. She's had a cough and bronchitis for the past several weeks. She denies any syncope or presyncope. She denies any chest pain.  Her blood pressure has been well-controlled on. She's not had any problems.   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  Current Outpatient Prescriptions on File Prior to Visit  Medication Sig Dispense Refill  . amLODipine-valsartan (EXFORGE) 5-160 MG per tablet Take 1 tablet by mouth daily.        Marland Kitchen atorvastatin (LIPITOR) 10 MG tablet Take 1 tablet (10 mg total) by mouth daily.  30 tablet  5  . ergocalciferol (VITAMIN D2) 50000 UNITS capsule Take 50,000 Units by mouth once a week.        . fish oil-omega-3 fatty acids 1000 MG capsule Take 2 g by mouth daily. 4 per day        . folic acid (FOLVITE) 1 MG tablet Take 3 mg by mouth daily.       . hydrochlorothiazide 25 MG tablet Take 25 mg by mouth daily.        . methotrexate (RHEUMATREX) 2.5 MG tablet Take 2.5 mg by mouth once a week. Caution:Chemotherapy. Protect from light.       Marland Kitchen omeprazole (PRILOSEC) 20 MG capsule Take 20 mg by mouth daily.        Marland Kitchen DISCONTD: metoprolol succinate (TOPROL-XL) 50 MG 24 hr tablet Take 1 tablet (50 mg total) by mouth 2 (two) times daily.  60 tablet  6    No Known Allergies  Past Medical History  Diagnosis Date  . Hypertension   . GERD (gastroesophageal reflux disease)   . LVH (left ventricular hypertrophy)     WITH  ASYMMETRIC SEPTAL HYPERTROPHY  . Diastolic dysfunction   . Mitral regurgitation   . Gallbladder disease     Past Surgical History  Procedure Date  . Total hip arthroplasty   . US echocardiography 02/21/2008    EF 55-60%  . US echocardiography 07/21/2005    EF 55-60%  . Cardiovascular stress test 03/10/2006    EF 69%. NO EVIDENCE OF ISCHEMIA    History  Smoking status  . Never Smoker   Smokeless tobacco  . Not on file    History  Alcohol Use No    Family History  Problem Relation Age of Onset  . Hypertension Mother   . Stroke Mother   . Leukemia Father   . Heart attack Brother     Reviw of Systems:  Reviewed in the HPI.  All other systems are negative.  Physical Exam: Blood pressure 140/76, pulse 56, height 5' 2.5" (1.588 m), weight 150 lb 6.4 oz (68.221 kg). General: Well developed, well nourished, in no acute distress.  Head: Normocephalic, atraumatic, sclera non-icteric, mucus membranes are moist,   Neck: Supple. Negative for carotid bruits. JVD not elevated.  Lungs: Clear bilaterally to auscultation without wheezes, rales, or rhonchi.  Breathing is unlabored.  Heart: RRR with S1 S2. There is a 2/6 systolic murmur.  Abdomen: Soft, non-tender, non-distended with normoactive bowel sounds. No hepatomegaly. No rebound/guarding. No obvious abdominal masses.  Msk:  Strength and tone appear normal for age.  Extremities: No clubbing or cyanosis. No edema.  Distal pedal pulses are 2+ and equal bilaterally.  Neuro: Alert and oriented X 3. Moves all extremities spontaneously.  Psych:  Responds to questions appropriately with a normal affect.  ECG: Oct. 15, 2013 - Sinus brady at 82.  Otherwise normal ECG. Assessment / Plan:

## 2012-05-02 NOTE — Assessment & Plan Note (Signed)
Stacy [name] is doing fairly well. Her blood pressure is a little bit elevated but this is likely because she has not taken her medications today. She still makes fairly active for someone who is 78 years old.  I've encouraged her to continue with her current medications. I'll see her in the office in 6 months. We'll check fasting labs at that time

## 2012-05-02 NOTE — Patient Instructions (Addendum)
Your physician wants you to follow-up in: 6 months  You will receive a reminder letter in the mail two months in advance. If you don't receive a letter, please call our office to schedule the follow-up appointment.  Your physician recommends that you return for a FASTING lipid profile: 6 months   

## 2012-05-10 ENCOUNTER — Encounter: Payer: Self-pay | Admitting: Cardiovascular Disease

## 2012-05-11 ENCOUNTER — Encounter: Payer: Self-pay | Admitting: Cardiovascular Disease

## 2012-10-26 ENCOUNTER — Ambulatory Visit (INDEPENDENT_AMBULATORY_CARE_PROVIDER_SITE_OTHER): Payer: Medicare Other | Admitting: Cardiovascular Disease

## 2012-10-26 ENCOUNTER — Encounter: Payer: Self-pay | Admitting: Cardiovascular Disease

## 2012-10-26 VITALS — BP 130/54 | HR 67 | Ht 62.5 in | Wt 151.8 lb

## 2012-10-26 DIAGNOSIS — I517 Cardiomegaly: Secondary | ICD-10-CM

## 2012-10-26 DIAGNOSIS — E785 Hyperlipidemia, unspecified: Secondary | ICD-10-CM

## 2012-10-26 MED ORDER — METOPROLOL TARTRATE 25 MG PO TABS: ORAL_TABLET | ORAL | Status: DC

## 2012-10-26 NOTE — Assessment & Plan Note (Signed)
She has her lipids measured at Dr. Laurey Morale office.

## 2012-10-26 NOTE — Progress Notes (Signed)
Stacy [name] Date of Birth  May 09, 1929 [facility]     [place] Office  [address]    [address]   [address] [place], [place]  [address]    [place], [place]  [address] [phone]  Fax  [phone]  [phone]  Fax [phone]  Problems: 1. 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 Hyperlipidemia 3. Mitral regurgitation   History of Present Illness:  [name] is done very well from a cardiac standpoint. She's not had any episodes of chest pain or shortness of breath. She's had a cough and bronchitis for the past several weeks. She denies any syncope or presyncope. She denies any chest pain.  Her blood pressure has been well-controlled on. She's not had any problems.   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  April 10 , 2014:  No chest pain.  No dyspnea.  BP has been ok.  She gets some exercise - not as much as she should - also has orthopedic issues that limit her.   Current Outpatient Prescriptions on File Prior to Visit  Medication Sig Dispense Refill  . atorvastatin (LIPITOR) 10 MG tablet Take 1 tablet (10 mg total) by mouth daily.  30 tablet  5  . amLODipine-valsartan (EXFORGE) 5-160 MG per tablet Take 1 tablet by mouth daily.        . ergocalciferol (VITAMIN D2) 50000 UNITS capsule Take 50,000 Units by mouth once a week.        . fish oil-omega-3 fatty acids 1000 MG capsule Take 2 g by mouth daily. 4 per day        . folic acid (FOLVITE) 1 MG tablet Take 3 mg by mouth daily.       . hydrochlorothiazide 25 MG tablet Take 25 mg by mouth daily.        . methotrexate (RHEUMATREX) 2.5 MG tablet Take 2.5 mg by mouth once a week. Caution:Chemotherapy. Protect from light.       . metoprolol succinate (TOPROL-XL) 50 MG 24 hr tablet Take 25 mg by mouth daily.      Marland Kitchen omeprazole (PRILOSEC) 20 MG capsule Take 20 mg by mouth daily.         No current facility-administered medications on file prior to  visit.    No Known Allergies  Past Medical History  Diagnosis Date  . Hypertension   . GERD (gastroesophageal reflux disease)   . LVH (left ventricular hypertrophy)     WITH ASYMMETRIC SEPTAL HYPERTROPHY  . Diastolic dysfunction   . Mitral regurgitation   . Gallbladder disease     Past Surgical History  Procedure Laterality Date  . Total hip arthroplasty    . US echocardiography  02/21/2008    EF 55-60%  . US echocardiography  07/21/2005    EF 55-60%  . Cardiovascular stress test  03/10/2006    EF 69%. NO EVIDENCE OF ISCHEMIA    History  Smoking status  . Never Smoker   Smokeless tobacco  . Not on file    History  Alcohol Use No    Family History  Problem Relation Age of Onset  . Hypertension Mother   . Stroke Mother   . Leukemia Father   . Heart attack Brother     Reviw of Systems:  Reviewed in the HPI.  All other systems are negative.  Physical Exam: Blood pressure 130/54, pulse 67, height 5' 2.5" (1.588 m), weight 151  lb 12.8 oz (68.856 kg), SpO2 97.00%. General: Well developed, well nourished, in no acute distress.  Head: Normocephalic, atraumatic, sclera non-icteric, mucus membranes are moist,   Neck: Supple. Negative for carotid bruits. JVD not elevated.  Lungs: Clear bilaterally to auscultation without wheezes, rales, or rhonchi. Breathing is unlabored.  Heart: RRR with S1 S2. There is a 2-3/6 systolic murmur.  Abdomen: Soft, non-tender, non-distended with normoactive bowel sounds. No hepatomegaly. No rebound/guarding. No obvious abdominal masses.  Msk:  Strength and tone appear normal for age.  Extremities: No clubbing or cyanosis. No edema.  Distal pedal pulses are 2+ and equal bilaterally.  Neuro: Alert and oriented X 3. Moves all extremities spontaneously.  Psych:  Responds to questions appropriately with a normal affect.  ECG: Oct. 15, 2013 - Sinus brady at 25.  Otherwise normal ECG. Assessment / Plan:

## 2012-10-26 NOTE — Patient Instructions (Signed)
Your physician wants you to follow-up in: 3 MONTHS WITH DR Elease Hashimoto You will receive a reminder letter in the mail two months in advance. If you don't receive a letter, please call our office to schedule the follow-up appointment.  Your physician has recommended you make the following change in your medication:   STOP METOPROLOL XL DUE TO COST START METOPROLOL 50 MG IN MORNING AND 25 MG IN EVENING

## 2012-10-26 NOTE — Assessment & Plan Note (Signed)
Stacy [name] has a left ventricular outflow tract obstruction. She does have some shortness of breath with exertion. We'll increase her metoprolol to 50 mg in the morning and 25 mg right. The Toprol-XL as to expensive for her. We'll discontinue the Toprol-XL and start metoprolol tartrate instead. She will record her heart rate and blood pressure on a regular basis. I will see her in 3 months for a followup visit.

## 2013-01-24 ENCOUNTER — Ambulatory Visit: Payer: Medicare Other | Admitting: Cardiovascular Disease

## 2013-02-02 ENCOUNTER — Encounter: Payer: Self-pay | Admitting: Cardiovascular Disease

## 2013-04-02 ENCOUNTER — Encounter: Payer: Self-pay | Admitting: Cardiovascular Disease

## 2013-04-02 ENCOUNTER — Ambulatory Visit (INDEPENDENT_AMBULATORY_CARE_PROVIDER_SITE_OTHER): Payer: Medicare Other | Admitting: Cardiovascular Disease

## 2013-04-02 VITALS — BP 132/68 | HR 55 | Ht 62.5 in | Wt 151.0 lb

## 2013-04-02 DIAGNOSIS — I10 Essential (primary) hypertension: Secondary | ICD-10-CM

## 2013-04-02 DIAGNOSIS — I517 Cardiomegaly: Secondary | ICD-10-CM

## 2013-04-02 DIAGNOSIS — E785 Hyperlipidemia, unspecified: Secondary | ICD-10-CM

## 2013-04-02 NOTE — Assessment & Plan Note (Signed)
Mrs. Enerson remained stable. She's now on a low dose of metoprolol and feels well. She has baseline bradycardia but is a symptomatically. She does note some mild leg edema particularly if she does not wear her compression hose.  I've given her some information regarding the Lounge Doctor Leg rest.    We will continue with her current meds.  I will see her again in 1 year.

## 2013-04-02 NOTE — Progress Notes (Signed)
Tyson Babinski Date of Birth  Jan 31, 1929 [facility]     [place] Office  [address]    [address]   [address] [place], [place]  [address]    [place], [place]  [address] [phone]  Fax  [phone]  [phone]  Fax [phone]  Problems: 1. 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 Hyperlipidemia 3. Mitral regurgitation   History of Present Illness:  Mrs. Newland is done very well from a cardiac standpoint. She's not had any episodes of chest pain or shortness of breath. She's had a cough and bronchitis for the past several weeks. She denies any syncope or presyncope. She denies any chest pain.  Her blood pressure has been well-controlled on. She's not had any problems.   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  April 10 , 2014:  No chest pain.  No dyspnea.  BP has been ok.  She gets some exercise - not as much as she should - also has orthopedic issues that limit her.   Sept. 15, 2014:  She has a mild dynamic  LVOT obstruction.   We increased her metoprolol at the last visit and she is doing well.  No dizziness.  She does have some vertigo and takes meclizine.  Current Outpatient Prescriptions on File Prior to Visit  Medication Sig Dispense Refill  . amLODipine-valsartan (EXFORGE) 5-160 MG per tablet Take 1 tablet by mouth daily.        Marland Kitchen aspirin 81 MG tablet Take 81 mg by mouth daily.      Marland Kitchen atorvastatin (LIPITOR) 10 MG tablet Take 1 tablet (10 mg total) by mouth daily.  30 tablet  5  . ergocalciferol (VITAMIN D2) 50000 UNITS capsule Take 50,000 Units by mouth once a week.        . fish oil-omega-3 fatty acids 1000 MG capsule Take 2 g by mouth daily. 4 per day        . folic acid (FOLVITE) 1 MG tablet Take 3 mg by mouth daily.       . hydrochlorothiazide 25 MG tablet Take 25 mg by mouth daily.        . methotrexate (RHEUMATREX) 2.5 MG tablet Take 2.5 mg by mouth once a week.  Caution:Chemotherapy. Protect from light.       . metoprolol tartrate (LOPRESSOR) 25 MG tablet TAKE 50 MG (2 TABS) IN MORNING AND 25 MG IN EVENING  90 tablet  5   No current facility-administered medications on file prior to visit.    No Known Allergies  Past Medical History  Diagnosis Date  . Hypertension   . GERD (gastroesophageal reflux disease)   . LVH (left ventricular hypertrophy)     WITH ASYMMETRIC SEPTAL HYPERTROPHY  . Diastolic dysfunction   . Mitral regurgitation   . Gallbladder disease     Past Surgical History  Procedure Laterality Date  . Total hip arthroplasty    . US echocardiography  02/21/2008    EF 55-60%  . US echocardiography  07/21/2005    EF 55-60%  . Cardiovascular stress test  03/10/2006    EF 69%. NO EVIDENCE OF ISCHEMIA    History  Smoking status  . Never Smoker   Smokeless tobacco  . Not on file    History  Alcohol Use No    Family History  Problem Relation Age of Onset  . Hypertension Mother   . Stroke Mother   .  Leukemia Father   . Heart attack Brother     Reviw of Systems:  Reviewed in the HPI.  All other systems are negative.  Physical Exam: Blood pressure 132/68, pulse 55, height 5' 2.5" (1.588 m), weight 151 lb (68.493 kg). General: Well developed, well nourished, in no acute distress.  Head: Normocephalic, atraumatic, sclera non-icteric, mucus membranes are moist,   Neck: Supple. Negative for carotid bruits. JVD not elevated.  Lungs: Clear bilaterally to auscultation without wheezes, rales, or rhonchi. Breathing is unlabored.  Heart: RRR with S1 S2. There is a 2-3/6 systolic murmur.  Abdomen: Soft, non-tender, non-distended with normoactive bowel sounds. No hepatomegaly. No rebound/guarding. No obvious abdominal masses.  Msk:  Strength and tone appear normal for age.  Extremities: No clubbing or cyanosis. No edema.  Distal pedal pulses are 2+ and equal bilaterally.  Neuro: Alert and oriented X 3. Moves all extremities  spontaneously.  Psych:  Responds to questions appropriately with a normal affect.  ECG: Sept. 15, sinus brady at 6 with 1st degree AVB.  Assessment / Plan:

## 2013-04-02 NOTE — Patient Instructions (Addendum)
Your physician wants you to follow-up in: 1 year  You will receive a reminder letter in the mail two months in advance. If you don't receive a letter, please call our office to schedule the follow-up appointment.  I have recommended that you elevate your legs.  An ideal way is to get a Lounge Doctor Leg rest - invented by one of our local vascular surgeons.  Website:  [email or web address]

## 2013-05-02 ENCOUNTER — Other Ambulatory Visit: Payer: Self-pay

## 2013-05-02 MED ORDER — ATORVASTATIN CALCIUM 10 MG PO TABS: 10.0000 mg | ORAL_TABLET | Freq: Every day | ORAL | Status: DC

## 2013-05-11 ENCOUNTER — Other Ambulatory Visit: Payer: Self-pay

## 2013-05-11 DIAGNOSIS — Z1231 Encounter for screening mammogram for malignant neoplasm of breast: Secondary | ICD-10-CM

## 2013-05-15 ENCOUNTER — Ambulatory Visit
Admission: RE | Admit: 2013-05-15 | Discharge: 2013-05-15 | Disposition: A | Discharge status: Home / Self Care | Payer: Medicare Other | Source: Ambulatory Visit

## 2013-05-15 DIAGNOSIS — Z1231 Encounter for screening mammogram for malignant neoplasm of breast: Secondary | ICD-10-CM

## 2013-06-28 ENCOUNTER — Other Ambulatory Visit: Payer: Self-pay | Admitting: *Deleted

## 2013-06-28 MED ORDER — METOPROLOL TARTRATE 25 MG PO TABS: ORAL_TABLET | ORAL | Status: DC

## 2014-04-04 ENCOUNTER — Encounter: Payer: Self-pay | Admitting: Cardiovascular Disease

## 2014-04-04 ENCOUNTER — Ambulatory Visit (INDEPENDENT_AMBULATORY_CARE_PROVIDER_SITE_OTHER): Payer: Medicare Other | Admitting: Cardiovascular Disease

## 2014-04-04 VITALS — BP 140/66 | HR 50 | Ht 62.5 in | Wt 145.0 lb

## 2014-04-04 DIAGNOSIS — I10 Essential (primary) hypertension: Secondary | ICD-10-CM

## 2014-04-04 DIAGNOSIS — I517 Cardiomegaly: Secondary | ICD-10-CM

## 2014-04-04 DIAGNOSIS — E785 Hyperlipidemia, unspecified: Secondary | ICD-10-CM

## 2014-04-04 NOTE — Patient Instructions (Signed)
Your physician recommends that you continue on your current medications as directed. Please refer to the Current Medication list given to you today.  Your physician wants you to follow-up in: 1 year with Dr. Nahser.  You will receive a reminder letter in the mail two months in advance. If you don't receive a letter, please call our office to schedule the follow-up appointment.  

## 2014-04-04 NOTE — Progress Notes (Signed)
Stacy [name] Date of Birth  06-07-1929 [place] [address]    [place]   [place], [place]  [address]    [place], [place]  [address] [phone]  Fax  [phone]  [phone]  Fax [phone]  Problems: 1. 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 Hyperlipidemia 3. Mitral regurgitation   History of Present Illness:  Stacy [name] is done very well from a cardiac standpoint. She's not had any episodes of chest pain or shortness of breath. She's had a cough and bronchitis for the past several weeks. She denies any syncope or presyncope. She denies any chest pain.  Her blood pressure has been well-controlled on. She's not had any problems.   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  April 10 , 2014:  No chest pain.  No dyspnea.  BP has been ok.  She gets some exercise - not as much as she should - also has orthopedic issues that limit her.   Sept. 15, 2014:  She has a mild dynamic  LVOT obstruction.   We increased her metoprolol at the last visit and she is doing well.  No dizziness.  She does have some vertigo and takes meclizine.  sEpt. 17, 2015: Doing well.  No CP or dyspnea.   Exercising regularly.    Current Outpatient Prescriptions on File Prior to Visit  Medication Sig Dispense Refill  . aspirin 81 MG tablet Take 81 mg by mouth daily.      Marland Kitchen atorvastatin (LIPITOR) 10 MG tablet Take 1 tablet (10 mg total) by mouth daily.  30 tablet  5  . ergocalciferol (VITAMIN D2) 50000 UNITS capsule Take 50,000 Units by mouth once a week.        . fish oil-omega-3 fatty acids 1000 MG capsule Take 2 g by mouth daily. 4 per day        . folic acid (FOLVITE) 1 MG tablet Take 3 mg by mouth daily.       . methotrexate (RHEUMATREX) 2.5 MG tablet Take 2.5 mg by mouth once a week. Caution:Chemotherapy. Protect from light.       . metoprolol tartrate (LOPRESSOR) 25 MG tablet TAKE  50 MG (2 TABS) IN MORNING AND 25 MG IN EVENING  90 tablet  5   No current facility-administered medications on file prior to visit.    No Known Allergies  Past Medical History  Diagnosis Date  . Hypertension   . GERD (gastroesophageal reflux disease)   . LVH (left ventricular hypertrophy)     WITH ASYMMETRIC SEPTAL HYPERTROPHY  . Diastolic dysfunction   . Mitral regurgitation   . Gallbladder disease     Past Surgical History  Procedure Laterality Date  . Total hip arthroplasty    . US echocardiography  02/21/2008    EF 55-60%  . US echocardiography  07/21/2005    EF 55-60%  . Cardiovascular stress test  03/10/2006    EF 69%. NO EVIDENCE OF ISCHEMIA    History  Smoking status  . Never Smoker   Smokeless tobacco  . Not on file    History  Alcohol Use No    Family History  Problem Relation Age of Onset  . Hypertension Mother   . Stroke Mother   . Leukemia Father   . Heart attack Brother     Reviw of Systems:  Reviewed in the HPI.  All other systems are negative.  Physical Exam: Blood pressure 140/66, pulse 50, height 5' 2.5" (1.588 m), weight 145 lb (65.772 kg). General: Well developed, well nourished, in no acute distress.  Head: Normocephalic, atraumatic, sclera non-icteric, mucus membranes are moist,   Neck: Supple. Negative for carotid bruits. JVD not elevated.  Lungs: Clear bilaterally to auscultation without wheezes, rales, or rhonchi. Breathing is unlabored.  Heart: RRR with S1 S2. There is a 1-7/0 systolic murmur.  Abdomen: Soft, non-tender, non-distended with normoactive bowel sounds. No hepatomegaly. No rebound/guarding. No obvious abdominal masses.  Msk:  Strength and tone appear normal for age.  Extremities: No clubbing or cyanosis. No edema.  Distal pedal pulses are 2+ and equal bilaterally.  Neuro: Alert and oriented X 3. Moves all extremities spontaneously.  Psych:  Responds to questions appropriately with a normal affect.  ECG: Sept.  17, 2015:  Sinus brady at 50,  1st degree AV block.    Assessment / Plan:

## 2014-04-04 NOTE — Assessment & Plan Note (Signed)
She has mild HOCM.  Doing well on her dose of metoprolol  No symptoms    Continue same meds  Will see her again in 1 year.

## 2014-05-06 ENCOUNTER — Other Ambulatory Visit: Payer: Self-pay | Admitting: Cardiovascular Disease

## 2014-05-07 NOTE — Telephone Encounter (Signed)
I am sending this to you to verify if the patient is still to be taking this.(see pharmacy note on rx request) I also noticed that the patient has not had labs drawn here in over two years. Please advise. Thanks, MI

## 2014-05-07 NOTE — Telephone Encounter (Signed)
Lipids are handled by Dr. Joylene Draft - this Rx should be handled by his office

## 2014-07-23 ENCOUNTER — Other Ambulatory Visit: Payer: Self-pay

## 2014-07-23 DIAGNOSIS — Z1231 Encounter for screening mammogram for malignant neoplasm of breast: Secondary | ICD-10-CM

## 2014-08-19 ENCOUNTER — Ambulatory Visit
Admission: RE | Admit: 2014-08-19 | Discharge: 2014-08-19 | Disposition: A | Discharge status: Home / Self Care | Payer: Medicare Other | Source: Ambulatory Visit

## 2014-08-19 ENCOUNTER — Encounter (INDEPENDENT_AMBULATORY_CARE_PROVIDER_SITE_OTHER): Payer: Self-pay

## 2014-08-19 DIAGNOSIS — Z1231 Encounter for screening mammogram for malignant neoplasm of breast: Secondary | ICD-10-CM

## 2014-11-22 ENCOUNTER — Encounter: Payer: Self-pay | Admitting: Gastroenterology

## 2015-01-06 ENCOUNTER — Encounter: Payer: Self-pay | Admitting: Cardiovascular Disease

## 2015-04-14 ENCOUNTER — Ambulatory Visit (INDEPENDENT_AMBULATORY_CARE_PROVIDER_SITE_OTHER): Payer: Medicare Other | Admitting: Cardiovascular Disease

## 2015-04-14 ENCOUNTER — Encounter: Payer: Self-pay | Admitting: Cardiovascular Disease

## 2015-04-14 VITALS — BP 148/70 | HR 55 | Ht 62.5 in | Wt 147.0 lb

## 2015-04-14 DIAGNOSIS — I10 Essential (primary) hypertension: Secondary | ICD-10-CM

## 2015-04-14 DIAGNOSIS — E785 Hyperlipidemia, unspecified: Secondary | ICD-10-CM

## 2015-04-14 DIAGNOSIS — I421 Obstructive hypertrophic cardiomyopathy: Secondary | ICD-10-CM

## 2015-04-14 NOTE — Patient Instructions (Signed)
Medication Instructions:  Your physician recommends that you continue on your current medications as directed. Please refer to the Current Medication list given to you today.   Labwork: None Ordered   Testing/Procedures: Your physician has requested that you have an echocardiogram. Echocardiography is a painless test that uses sound waves to create images of your heart. It provides your doctor with information about the size and shape of your heart and how well your heart's chambers and valves are working. This procedure takes approximately one hour. There are no restrictions for this procedure.   Follow-Up: Your physician wants you to follow-up in: 1 year with Dr. Acie Fredrickson.  You will receive a reminder letter in the mail two months in advance. If you don't receive a letter, please call our office to schedule the follow-up appointment.

## 2015-04-14 NOTE — Progress Notes (Signed)
Stacy [name] Date of Birth  Nov 07, 1928 [place] [address]    [place]   [place], [place]  [address]    [name], [place]  [address] [phone]  Fax  [phone]  [phone]  Fax [phone]  Problems: 1. 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 Hyperlipidemia 3. Mitral regurgitation   History of Present Illness:  Stacy [name] is done very well from a cardiac standpoint. She's not had any episodes of chest pain or shortness of breath. She's had a cough and bronchitis for the past several weeks. She denies any syncope or presyncope. She denies any chest pain.  Her blood pressure has been well-controlled on. She's not had any problems.   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  April 10 , 2014:  No chest pain.  No dyspnea.  BP has been ok.  She gets some exercise - not as much as she should - also has orthopedic issues that limit her.   Sept. 15, 2014:  She has a mild dynamic  LVOT obstruction.   We increased her metoprolol at the last visit and she is doing well.  No dizziness.  She does have some vertigo and takes meclizine.  Sept. 17, 2015: Doing well.  No CP or dyspnea.   Exercising regularly.  Sept. 23, 2016:  Doing well from a cardiac standpoint Had an episode of vertigo a week ago .  Lasted for several minutes.  Did not go to the ER.  Symptoms have resolved completely    Current Outpatient Prescriptions on File Prior to Visit  Medication Sig Dispense Refill  . amLODipine (NORVASC) 2.5 MG tablet Take 2.5 mg by mouth daily.     Marland Kitchen aspirin 81 MG tablet Take 81 mg by mouth daily.    . ergocalciferol (VITAMIN D2) 50000 UNITS capsule Take 50,000 Units by mouth once a week.      . fish oil-omega-3 fatty acids 1000 MG capsule Take 2 g by mouth daily. 4 per day      . folic acid (FOLVITE) 1 MG tablet Take 3 mg by mouth daily.     . methotrexate  (RHEUMATREX) 2.5 MG tablet Take 2.5 mg by mouth once a week. Caution:Chemotherapy. Protect from light.     . metoprolol tartrate (LOPRESSOR) 25 MG tablet TAKE 50 MG (2 TABS) IN MORNING AND 25 MG IN EVENING 90 tablet 5  . atorvastatin (LIPITOR) 10 MG tablet Take 1 tablet (10 mg total) by mouth daily. (Patient not taking: Reported on 04/14/2015) 30 tablet 5   No current facility-administered medications on file prior to visit.    No Known Allergies  Past Medical History  Diagnosis Date  . Hypertension   . GERD (gastroesophageal reflux disease)   . LVH (left ventricular hypertrophy)     WITH ASYMMETRIC SEPTAL HYPERTROPHY  . Diastolic dysfunction   . Mitral regurgitation   . Gallbladder disease     Past Surgical History  Procedure Laterality Date  . Total hip arthroplasty    . US echocardiography  02/21/2008    EF 55-60%  . US echocardiography  07/21/2005    EF 55-60%  . Cardiovascular stress test  03/10/2006    EF 69%. NO EVIDENCE OF ISCHEMIA    History  Smoking status  . Never Smoker   Smokeless tobacco  . Not on file    History  Alcohol  Use No    Family History  Problem Relation Age of Onset  . Hypertension Mother   . Stroke Mother   . Leukemia Father   . Heart attack Brother     Reviw of Systems:  Reviewed in the HPI.  All other systems are negative.  Physical Exam: Blood pressure 148/70, pulse 55, height 5' 2.5" (1.588 m), weight 66.679 kg (147 lb). General: Well developed, well nourished, in no acute distress.  Head: Normocephalic, atraumatic, sclera non-icteric, mucus membranes are moist,   Neck: Supple. Negative for carotid bruits. JVD not elevated.  Lungs: Clear bilaterally to auscultation without wheezes, rales, or rhonchi. Breathing is unlabored.  Heart: RRR with S1 S2. There is a 3/6  Crescendo decrescendo systolic murmur.  Abdomen: Soft, non-tender, non-distended with normoactive bowel sounds. No hepatomegaly. No rebound/guarding. No obvious  abdominal masses.  Msk:  Strength and tone appear normal for age.  Extremities: No clubbing or cyanosis. No edema.  Distal pedal pulses are 2+ and equal bilaterally.  Neuro: Alert and oriented X 3. Moves all extremities spontaneously.  Psych:  Responds to questions appropriately with a normal affect.  ECG: Sept. 17, 2015:  Sinus brady at 67.  1st degree AV block   Assessment / Plan:   1. Left ventricular hypertrophy with mild LVOT obstruction-    Her systolic murmur sounds a bit louder than I remember.  Will get an echo. Our last echo was in 2009.  Continue current meds.   2. Hyperlipidemia  - followed by Dr. Joylene Draft  3. Mitral regurgitation-   Will re-evaluate by echo .  I will see her again in 1 year.     Nahser, Wonda Cheng, MD  04/14/2015 12:13 PM    [place],  [place], [place]  [address] Pager [phone] Phone: [phone]; Fax: [phone]   [facility]  [address] [place], [place]  [address] [phone]   Fax [phone]

## 2015-05-05 ENCOUNTER — Other Ambulatory Visit: Payer: Self-pay

## 2015-05-05 ENCOUNTER — Ambulatory Visit (HOSPITAL_COMMUNITY): Payer: Medicare Other | Attending: Cardiovascular Disease

## 2015-05-05 DIAGNOSIS — I421 Obstructive hypertrophic cardiomyopathy: Secondary | ICD-10-CM

## 2015-05-05 DIAGNOSIS — I340 Nonrheumatic mitral (valve) insufficiency: Secondary | ICD-10-CM | POA: Diagnosis not present

## 2015-05-05 DIAGNOSIS — I071 Rheumatic tricuspid insufficiency: Secondary | ICD-10-CM | POA: Insufficient documentation

## 2015-05-05 DIAGNOSIS — I10 Essential (primary) hypertension: Secondary | ICD-10-CM | POA: Insufficient documentation

## 2015-05-05 DIAGNOSIS — I422 Other hypertrophic cardiomyopathy: Secondary | ICD-10-CM | POA: Insufficient documentation

## 2015-05-05 DIAGNOSIS — I371 Nonrheumatic pulmonary valve insufficiency: Secondary | ICD-10-CM | POA: Diagnosis not present

## 2015-05-05 DIAGNOSIS — I517 Cardiomegaly: Secondary | ICD-10-CM | POA: Diagnosis not present

## 2015-05-05 DIAGNOSIS — E785 Hyperlipidemia, unspecified: Secondary | ICD-10-CM | POA: Insufficient documentation

## 2015-06-26 ENCOUNTER — Encounter (HOSPITAL_COMMUNITY): Payer: Self-pay | Admitting: Emergency Medicine

## 2015-06-26 ENCOUNTER — Emergency Department (HOSPITAL_COMMUNITY): Payer: Medicare Other

## 2015-06-26 ENCOUNTER — Emergency Department (HOSPITAL_COMMUNITY)
Admission: EM | Admit: 2015-06-26 | Discharge: 2015-06-26 | Disposition: A | Discharge status: Home / Self Care | Payer: Medicare Other | Attending: Emergency Medicine | Admitting: Emergency Medicine

## 2015-06-26 DIAGNOSIS — R42 Dizziness and giddiness: Secondary | ICD-10-CM | POA: Diagnosis not present

## 2015-06-26 DIAGNOSIS — R51 Headache: Secondary | ICD-10-CM | POA: Diagnosis not present

## 2015-06-26 DIAGNOSIS — Z7982 Long term (current) use of aspirin: Secondary | ICD-10-CM | POA: Insufficient documentation

## 2015-06-26 DIAGNOSIS — I10 Essential (primary) hypertension: Secondary | ICD-10-CM | POA: Diagnosis not present

## 2015-06-26 DIAGNOSIS — K219 Gastro-esophageal reflux disease without esophagitis: Secondary | ICD-10-CM | POA: Diagnosis not present

## 2015-06-26 DIAGNOSIS — R260 Ataxic gait: Secondary | ICD-10-CM | POA: Diagnosis not present

## 2015-06-26 DIAGNOSIS — Z79899 Other long term (current) drug therapy: Secondary | ICD-10-CM | POA: Insufficient documentation

## 2015-06-26 DIAGNOSIS — E785 Hyperlipidemia, unspecified: Secondary | ICD-10-CM | POA: Insufficient documentation

## 2015-06-26 LAB — DIFFERENTIAL
Basophils Absolute: 0 10*3/uL (ref 0.0–0.1)
Basophils Relative: 1 %
Eosinophils Absolute: 0.3 10*3/uL (ref 0.0–0.7)
Eosinophils Relative: 4 %
Lymphocytes Relative: 38 %
Lymphs Abs: 2.4 10*3/uL (ref 0.7–4.0)
Monocytes Absolute: 0.6 10*3/uL (ref 0.1–1.0)
Monocytes Relative: 10 %
Neutro Abs: 2.9 10*3/uL (ref 1.7–7.7)
Neutrophils Relative %: 47 %

## 2015-06-26 LAB — CBG MONITORING, ED: Glucose-Capillary: 92 mg/dL (ref 65–99)

## 2015-06-26 LAB — I-STAT CHEM 8, ED
BUN: 30 mg/dL — ABNORMAL HIGH (ref 6–20)
Calcium, Ion: 1.24 mmol/L (ref 1.13–1.30)
Chloride: 103 mmol/L (ref 101–111)
Creatinine, Ser: 0.7 mg/dL (ref 0.44–1.00)
Glucose, Bld: 85 mg/dL (ref 65–99)
HCT: 41 % (ref 36.0–46.0)
Hemoglobin: 13.9 g/dL (ref 12.0–15.0)
Potassium: 3.9 mmol/L (ref 3.5–5.1)
Sodium: 141 mmol/L (ref 135–145)
TCO2: 29 mmol/L (ref 0–100)

## 2015-06-26 LAB — COMPREHENSIVE METABOLIC PANEL
ALT: 17 U/L (ref 14–54)
AST: 23 U/L (ref 15–41)
Albumin: 3.8 g/dL (ref 3.5–5.0)
Alkaline Phosphatase: 54 U/L (ref 38–126)
Anion gap: 6 (ref 5–15)
BUN: 24 mg/dL — ABNORMAL HIGH (ref 6–20)
CO2: 29 mmol/L (ref 22–32)
Calcium: 9.3 mg/dL (ref 8.9–10.3)
Chloride: 106 mmol/L (ref 101–111)
Creatinine, Ser: 0.68 mg/dL (ref 0.44–1.00)
GFR calc Af Amer: >60 mL/min (ref >60)
GFR calc non Af Amer: >60 mL/min (ref >60)
Glucose, Bld: 93 mg/dL (ref 65–99)
Potassium: 3.9 mmol/L (ref 3.5–5.1)
Sodium: 141 mmol/L (ref 135–145)
Total Bilirubin: 0.6 mg/dL (ref 0.3–1.2)
Total Protein: 6.6 g/dL (ref 6.5–8.1)

## 2015-06-26 LAB — PROTIME-INR
INR: 1.08 (ref 0.00–1.49)
Prothrombin Time: 14.2 seconds (ref 11.6–15.2)

## 2015-06-26 LAB — CBC
HCT: 39.3 % (ref 36.0–46.0)
Hemoglobin: 12.5 g/dL (ref 12.0–15.0)
MCH: 30.9 pg (ref 26.0–34.0)
MCHC: 31.8 g/dL (ref 30.0–36.0)
MCV: 97 fL (ref 78.0–100.0)
Platelets: 167 10*3/uL (ref 150–400)
RBC: 4.05 MIL/uL (ref 3.87–5.11)
RDW: 13.7 % (ref 11.5–15.5)
WBC: 6.2 10*3/uL (ref 4.0–10.5)

## 2015-06-26 LAB — I-STAT TROPONIN, ED: Troponin i, poc: 0.02 ng/mL (ref 0.00–0.08)

## 2015-06-26 LAB — APTT: aPTT: 38 seconds — ABNORMAL HIGH (ref 24–37)

## 2015-06-26 MED ORDER — SODIUM CHLORIDE 0.9 % IV BOLUS (SEPSIS)
1000.0000 mL | Freq: Once | INTRAVENOUS | Status: AC
  Administered 2015-06-26: 1000 mL via INTRAVENOUS

## 2015-06-26 NOTE — ED Notes (Signed)
Onset of symptoms 0430.  Spoke with charge, no activation.

## 2015-06-26 NOTE — ED Notes (Signed)
Pt left with all her belongings and was wheeled out of the treatment area.  

## 2015-06-26 NOTE — ED Provider Notes (Signed)
CSN: [id number]     Arrival date & time 06/26/15  1239 History   First MD Initiated Contact with Patient 06/26/15 1550     Chief Complaint  Patient presents with  . Dizziness  . Leg Pain    Patient is a 81 y.o. female presenting with dizziness. The history is provided by the patient.  Dizziness Quality:  Lightheadedness and imbalance Severity:  Moderate Onset quality:  Sudden Duration:  2 hours Timing:  Constant Progression:  Resolved Chronicity:  New Context comment:  Walking Relieved by:  Nothing Associated symptoms: no blood in stool, no chest pain, no headaches, no hearing loss, no nausea, no palpitations, no shortness of breath, no syncope, no vomiting and no weakness   Risk factors: hx of vertigo     Past Medical History  Diagnosis Date  . Hypertension   . GERD (gastroesophageal reflux disease)   . LVH (left ventricular hypertrophy)     WITH ASYMMETRIC SEPTAL HYPERTROPHY  . Diastolic dysfunction   . Mitral regurgitation   . Gallbladder disease    Past Surgical History  Procedure Laterality Date  . Total hip arthroplasty    . US echocardiography  02/21/2008    EF 55-60%  . US echocardiography  07/21/2005    EF 55-60%  . Cardiovascular stress test  03/10/2006    EF 69%. NO EVIDENCE OF ISCHEMIA   Family History  Problem Relation Age of Onset  . Hypertension Mother   . Stroke Mother   . Leukemia Father   . Heart attack Brother    Social History  Substance Use Topics  . Smoking status: Never Smoker   . Smokeless tobacco: None  . Alcohol Use: No   OB History    No data available     Review of Systems  Constitutional: Negative for fever.  HENT: Negative for hearing loss, rhinorrhea and sore throat.   Eyes: Negative for visual disturbance.  Respiratory: Negative for chest tightness and shortness of breath.   Cardiovascular: Negative for chest pain, palpitations and syncope.  Gastrointestinal: Negative for nausea, vomiting, abdominal pain, constipation and  blood in stool.  Genitourinary: Negative for dysuria and hematuria.  Musculoskeletal: Positive for gait problem. Negative for back pain and neck pain.  Skin: Negative for rash.  Neurological: Positive for dizziness and light-headedness. Negative for tremors, syncope, facial asymmetry, speech difficulty, weakness, numbness and headaches.  Psychiatric/Behavioral: Negative for confusion.  All other systems reviewed and are negative.     Allergies  Review of patient's allergies indicates no known allergies.  Home Medications   Prior to Admission medications   Medication Sig Start Date End Date Taking? Authorizing Provider  amLODipine (NORVASC) 2.5 MG tablet Take 2.5 mg by mouth daily.  03/28/14  Yes Historical Provider, MD  aspirin 81 MG tablet Take 81 mg by mouth daily.   Yes Historical Provider, MD  atorvastatin (LIPITOR) 10 MG tablet Take 1 tablet (10 mg total) by mouth daily. 05/02/13  Yes Thayer Headings, MD  ergocalciferol (VITAMIN D2) 50000 UNITS capsule Take 50,000 Units by mouth once a week.     Yes Historical Provider, MD  fish oil-omega-3 fatty acids 1000 MG capsule Take 2 g by mouth daily.    Yes Historical Provider, MD  folic acid (FOLVITE) 1 MG tablet Take 3 mg by mouth daily.    Yes Historical Provider, MD  meclizine (ANTIVERT) 25 MG tablet Take 25 mg by mouth daily as needed. As needed for dizziness 02/07/15  Yes Historical Provider,  MD  methotrexate (RHEUMATREX) 2.5 MG tablet Take 15 mg by mouth once a week. Caution:Chemotherapy. Protect from light.   Yes Historical Provider, MD  metoprolol tartrate (LOPRESSOR) 25 MG tablet TAKE 50 MG (2 TABS) IN MORNING AND 25 MG IN EVENING Patient taking differently: Take 25-50 mg by mouth 2 (two) times daily. TAKE 50 MG (2 TABS) IN MORNING AND 25 MG IN EVENING 06/28/13  Yes Thayer Headings, MD  omeprazole (PRILOSEC) 20 MG capsule Take 20 mg by mouth daily as needed (heartburn).  03/26/15  Yes Historical Provider, MD  pseudoephedrine (SUDAFED)  30 MG tablet Take 30 mg by mouth every 4 (four) hours as needed for congestion.   Yes Historical Provider, MD  rOPINIRole (REQUIP) 1 MG tablet Take 1 mg by mouth daily. 03/27/15  Yes Historical Provider, MD  traMADol (ULTRAM) 50 MG tablet Take 50 mg by mouth daily as needed. As needed for moderate pain 02/12/15  Yes Historical Provider, MD  valsartan (DIOVAN) 160 MG tablet Take 160 mg by mouth daily. 03/26/15  Yes Historical Provider, MD   BP 136/60 mmHg  Pulse 56  Temp(Src) 97.4 F (36.3 C) (Oral)  Resp 20  SpO2 95% Physical Exam  Constitutional: She is oriented to person, place, and time. She appears well-developed and well-nourished. No distress.  HENT:  Head: Normocephalic and atraumatic.  Mouth/Throat: Oropharynx is clear and moist.  Eyes: EOM are normal. Pupils are equal, round, and reactive to light.  Neck: Neck supple. No JVD present. Carotid bruit is not present.  Cardiovascular: Normal rate, regular rhythm, normal heart sounds and intact distal pulses.  Exam reveals no gallop.   No murmur heard. Pulmonary/Chest: Effort normal and breath sounds normal. She has no wheezes. She has no rales.  Abdominal: Soft. She exhibits no distension. There is no tenderness.  Musculoskeletal: Normal range of motion. She exhibits no tenderness.  Neurological: She is alert and oriented to person, place, and time. No cranial nerve deficit or sensory deficit. She exhibits normal muscle tone. Coordination and gait normal. GCS eye subscore is 4. GCS verbal subscore is 5. GCS motor subscore is 6.  Reflex Scores:      Bicep reflexes are 2+ on the right side and 2+ on the left side.      Brachioradialis reflexes are 2+ on the right side and 2+ on the left side.      Patellar reflexes are 2+ on the right side and 2+ on the left side. Normal finger nose exam. No dysdiadochokinesia.  Skin: Skin is warm and dry. No rash noted.  Psychiatric: Her behavior is normal.    ED Course  Procedures  None   Labs  Review Labs Reviewed  APTT - Abnormal; Notable for the following:    aPTT 38 (*)    All other components within normal limits  COMPREHENSIVE METABOLIC PANEL - Abnormal; Notable for the following:    BUN 24 (*)    All other components within normal limits  I-STAT CHEM 8, ED - Abnormal; Notable for the following:    BUN 30 (*)    All other components within normal limits  PROTIME-INR  CBC  DIFFERENTIAL  I-STAT TROPOININ, ED  CBG MONITORING, ED    Imaging Review Ct Head Wo Contrast  06/26/2015  CLINICAL DATA:  81 year old hypertensive female with headache 3 days ago which resolved. Only able to move left-side when walking. Initial encounter. EXAM: CT HEAD WITHOUT CONTRAST TECHNIQUE: Contiguous axial images were obtained from the base of  the skull through the vertex without intravenous contrast. COMPARISON:  None. FINDINGS: No intracranial hemorrhage. Infarct right thalamic region possibly subacute to chronic rather than acute. Prominent small vessel disease type changes. Ectatic vertebral arteries and basilar artery. Prominent basilar tip may be related to see ectasia rather than aneurysm although evaluation limited by CT. Carotid artery calcification. Post left lens replacement. No intracranial mass lesion noted on this unenhanced exam. Mastoid air cells, middle ear cavities and visualized paranasal sinuses are clear. IMPRESSION: No intracranial hemorrhage. Infarct right thalamic region possibly subacute to chronic rather than acute. Prominent small vessel disease type changes. Ectatic vertebral arteries and basilar artery. Prominent basilar tip may be related to see ectasia rather than aneurysm although evaluation limited by CT. Electronically Signed   By: Genia Del M.D.   On: 06/26/2015 15:14   Mr Brain Wo Contrast  06/26/2015  CLINICAL DATA:  81 year old hypertensive female with headache 3 days ago which resolved. Only able to move left-side when walking. Initial encounter.  Dizziness  unresponsive to Antivert. EXAM: MRI HEAD WITHOUT CONTRAST TECHNIQUE: Multiplanar, multiecho pulse sequences of the brain and surrounding structures were obtained without intravenous contrast. COMPARISON:  CT head earlier today. FINDINGS: No evidence for acute infarction, hemorrhage, mass lesion, hydrocephalus, or extra-axial fluid. Moderate cerebral and cerebellar atrophy. No large vessel cortical infarct is seen. Extensive focal and confluent T2 and FLAIR hyperintensity throughout the periventricular and subcortical white matter, largely sparing the cerebellum and brainstem, consistent with chronic microvascular ischemic change, likely hypertensive related. Deep white matter lacunar infarcts, the largest of which affects the RIGHT lateral thalamus, all appear chronic. Prominent perivascular spaces Tiny foci of chronic hemorrhage, in the periventricular and subcortical supratentorial white matter, as well as adjacent to the LEFT dentate nucleus, represent microbleeds as an apparent sequelae of longstanding hypertensive cerebrovascular disease. Prominent dolichoectasia of the intracranial vasculature, most notably the RIGHT vertebral with preserved flow voids. No evidence for large vessel occlusion. Within limits for assessment on non MRA study, no intracranial saccular aneurysm. Basilar tip unremarkable. Normal pituitary and cerebellar tonsils. Cervical spondylosis appears moderately severe at C5-6 although spinal stenosis is not established. LEFT cataract extraction.  No acute sinus or mastoid disease IMPRESSION: No acute stroke. The questioned abnormality on CT in the RIGHT thalamus is chronic Extensive white matter signal abnormality, intracranial dolichoectasia, prominent perivascular spaces, and scattered parenchymal microbleeds, consistent with longstanding hypertensive cerebral vascular disease. Electronically Signed   By: Staci Righter M.D.   On: 06/26/2015 19:29   I have personally reviewed and evaluated  these images and lab results as part of my medical decision-making.  MDM   Final diagnoses:  Dizziness    81 yo F with a PMH of diastolic dysfunction, GERD, HTN, HLD, and mitral regurgitation who presents after a dizzy episode around 430 am today. She reports leaning to the left as she walked. She called her PCP who told her to come to the ED. She is asymptomatic now. Neuro exam nonfocal, including cerebellar testing and gait. CT shows no acute abnormality with possible subacute or chronic right thalamic infarct. Uncertain if this explains her episode this morning. She takes antivert for occasional "inner ear" problems. Perhaps this was a vertebrobasilar insufficiency episode, vertigo, or possible CVA. Remainder of workup unremarkable. Patient denies falling, hitting her head, syncope, chest pain, SOB, or palpitations. Doubt ACS. ECG with NSR, rate 60 with 1st degree AV block, no ST elevation or depression, no wellens, no brugada, no ectopy  MRI obtained to further  evaluate for CNS pathology. MRI negative for acute CVA. She has chronic microvascular changes. Feel this could be consistent with her vertigo. Patient continues to take Antivert as needed. Discussed strict return precautions and encouraged her to follow up with her PCP. She voices understanding is agreeable with this plan.  Discussed with Dr. Wilson Singer.  Gustavus Bryant, MD 06/26/15 [id number]  Virgel Manifold, MD 06/28/15 [phone]

## 2015-06-26 NOTE — Discharge Instructions (Signed)

## 2015-06-26 NOTE — ED Notes (Signed)
Pt from home with c/o dizziness and head pain since 430 am today.  Pt reports she got up to go to the bathroom and had difficulty walking with leaning to the left.  Pt states she went to sleep normal at 11 pm last night.  States she took Antivert with no relief.  Additionally reports left upper calf tightness with pain to her left hip since waking at 430 this am.  Pt in NAD, A&O.

## 2015-06-26 NOTE — ED Notes (Signed)
Pt in MRI.

## 2015-10-14 ENCOUNTER — Ambulatory Visit (INDEPENDENT_AMBULATORY_CARE_PROVIDER_SITE_OTHER): Payer: Medicare Other | Admitting: Cardiovascular Disease

## 2015-10-14 ENCOUNTER — Encounter: Payer: Self-pay | Admitting: Cardiovascular Disease

## 2015-10-14 VITALS — BP 134/70 | HR 50 | Ht 62.5 in | Wt 149.8 lb

## 2015-10-14 DIAGNOSIS — Q248 Other specified congenital malformations of heart: Secondary | ICD-10-CM | POA: Diagnosis not present

## 2015-10-14 DIAGNOSIS — I10 Essential (primary) hypertension: Secondary | ICD-10-CM | POA: Diagnosis not present

## 2015-10-14 DIAGNOSIS — I517 Cardiomegaly: Secondary | ICD-10-CM | POA: Diagnosis not present

## 2015-10-14 MED ORDER — METOPROLOL TARTRATE 25 MG PO TABS: 25.0000 mg | ORAL_TABLET | Freq: Two times a day (BID) | ORAL | Status: DC

## 2015-10-14 NOTE — Patient Instructions (Signed)
Medication Instructions:  TAKE Metoprolol 25 mg twice daily   Labwork: None Ordered   Testing/Procedures: None Ordered   Follow-Up: Your physician wants you to follow-up in: 6 months with Dr. Acie Fredrickson.  You will receive a reminder letter in the mail two months in advance. If you don't receive a letter, please call our office to schedule the follow-up appointment.   If you need a refill on your cardiac medications before your next appointment, please call your pharmacy.   Thank you for choosing [facility]! Christen Bame, RN [phone]

## 2015-10-14 NOTE — Progress Notes (Signed)
Stacy [name] Date of Birth  1928/12/14 [place] [address]    [place]   [place], [place]  [address]    [place], [place]  [address] [phone]  Fax  [phone]  [phone]  Fax [phone]  Problems: 1. 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 Hyperlipidemia 3. Mitral regurgitation   History of Present Illness:  Stacy [name] is done very well from a cardiac standpoint. She's not had any episodes of chest pain or shortness of breath. She's had a cough and bronchitis for the past several weeks. She denies any syncope or presyncope. She denies any chest pain.  Her blood pressure has been well-controlled on. She's not had any problems.   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  April 10 , 2014:  No chest pain.  No dyspnea.  BP has been ok.  She gets some exercise - not as much as she should - also has orthopedic issues that limit her.   Sept. 15, 2014:  She has a mild dynamic  LVOT obstruction.   We increased her metoprolol at the last visit and she is doing well.  No dizziness.  She does have some vertigo and takes meclizine.  Sept. 17, 2015: Doing well.  No CP or dyspnea.   Exercising regularly.  Sept. 23, 2016:  Doing well from a cardiac standpoint Had an episode of vertigo a week ago .  Lasted for several minutes.  Did not go to the ER.  Symptoms have resolved completely   October 14, 2015: Stacy [name] has a dynamic LVOT obstruction that we are managing with metoprolol.   Her HR has been a bit slow She is off the  HCTZ .   i've advised her to avoid being volume depleted.  Has had some dizziness and headaches recently  Taking metoprolol 50 mg in the am , 25 mg in the evening     Current Outpatient Prescriptions on File Prior to Visit  Medication Sig Dispense Refill  . amLODipine (NORVASC) 2.5 MG tablet Take 2.5 mg by mouth daily.     Marland Kitchen aspirin 81 MG  tablet Take 81 mg by mouth daily.    Marland Kitchen atorvastatin (LIPITOR) 10 MG tablet Take 1 tablet (10 mg total) by mouth daily. 30 tablet 5  . ergocalciferol (VITAMIN D2) 50000 UNITS capsule Take 50,000 Units by mouth once a week.      . fish oil-omega-3 fatty acids 1000 MG capsule Take 2 g by mouth daily.     . folic acid (FOLVITE) 1 MG tablet Take 3 mg by mouth daily.     . meclizine (ANTIVERT) 25 MG tablet Take 25 mg by mouth daily as needed. As needed for dizziness    . methotrexate (RHEUMATREX) 2.5 MG tablet Take 15 mg by mouth once a week. Caution:Chemotherapy. Protect from light.    . metoprolol tartrate (LOPRESSOR) 25 MG tablet TAKE 50 MG (2 TABS) IN MORNING AND 25 MG IN EVENING (Patient taking differently: Take 25-50 mg by mouth 2 (two) times daily. TAKE 50 MG (2 TABS) IN MORNING AND 25 MG IN EVENING) 90 tablet 5  . omeprazole (PRILOSEC) 20 MG capsule Take 20 mg by mouth daily as needed (heartburn).     Marland Kitchen rOPINIRole (REQUIP) 1 MG tablet Take 1 mg by mouth daily.    . traMADol (ULTRAM) 50 MG tablet Take 50 mg  by mouth daily as needed. As needed for moderate pain    . valsartan (DIOVAN) 160 MG tablet Take 160 mg by mouth daily.     No current facility-administered medications on file prior to visit.    Allergies  Allergen Reactions  . Sulfa Antibiotics Nausea Only    Past Medical History  Diagnosis Date  . Hypertension   . GERD (gastroesophageal reflux disease)   . LVH (left ventricular hypertrophy)     WITH ASYMMETRIC SEPTAL HYPERTROPHY  . Diastolic dysfunction   . Mitral regurgitation   . Gallbladder disease     Past Surgical History  Procedure Laterality Date  . Total hip arthroplasty    . US echocardiography  02/21/2008    EF 55-60%  . US echocardiography  07/21/2005    EF 55-60%  . Cardiovascular stress test  03/10/2006    EF 69%. NO EVIDENCE OF ISCHEMIA    History  Smoking status  . Never Smoker   Smokeless tobacco  . Not on file    History  Alcohol Use No     Family History  Problem Relation Age of Onset  . Hypertension Mother   . Stroke Mother   . Leukemia Father   . Heart attack Brother     Reviw of Systems:  Reviewed in the HPI.  All other systems are negative.  Physical Exam: Blood pressure 134/70, pulse 50, height 5' 2.5" (1.588 m), weight 149 lb 12.8 oz (67.949 kg). General: Well developed, well nourished, in no acute distress.  Head: Normocephalic, atraumatic, sclera non-icteric, mucus membranes are moist,   Neck: Supple. Negative for carotid bruits. JVD not elevated.  Lungs: Clear bilaterally to auscultation without wheezes, rales, or rhonchi. Breathing is unlabored.  Heart: RRR with S1 S2. There is a 3/6  Crescendo decrescendo systolic murmur.  Abdomen: Soft, non-tender, non-distended with normoactive bowel sounds. No hepatomegaly. No rebound/guarding. No obvious abdominal masses.  Msk:  Strength and tone appear normal for age.  Extremities: No clubbing or cyanosis. No edema.  Distal pedal pulses are 2+ and equal bilaterally.  Neuro: Alert and oriented X 3. Moves all extremities spontaneously.  Psych:  Responds to questions appropriately with a normal affect.  ECG: October 14, 2015:  Sinus brady at 55.  1st degree AV block , inc. RBBB  Assessment / Plan:   1. Left ventricular hypertrophy with mild LVOT obstruction-   Continue metoprolol - slightly lower dose   2. Hyperlipidemia  - followed by Dr. Joylene Draft  3. Mitral regurgitation-   Will re-evaluate by echo .  I will see her again in 1 year.     Nahser, Wonda Cheng, MD  10/14/2015 9:35 AM    [place],  [place], [place]  [address] Pager [phone] Phone: [phone]; Fax: [phone]   [facility]  [address] [place], [place]  [address] [phone]   Fax [phone]

## 2016-01-28 ENCOUNTER — Other Ambulatory Visit: Payer: Self-pay | Admitting: Internal Medicine

## 2016-01-28 DIAGNOSIS — Z1231 Encounter for screening mammogram for malignant neoplasm of breast: Secondary | ICD-10-CM

## 2016-02-17 ENCOUNTER — Ambulatory Visit
Admission: RE | Admit: 2016-02-17 | Discharge: 2016-02-17 | Disposition: A | Discharge status: Home / Self Care | Payer: Medicare Other | Source: Ambulatory Visit | Attending: Internal Medicine | Admitting: Internal Medicine

## 2016-02-17 DIAGNOSIS — Z1231 Encounter for screening mammogram for malignant neoplasm of breast: Secondary | ICD-10-CM

## 2016-04-24 ENCOUNTER — Emergency Department (HOSPITAL_COMMUNITY)
Admission: EM | Admit: 2016-04-24 | Discharge: 2016-04-24 | Disposition: A | Discharge status: Home / Self Care | Payer: Medicare Other | Attending: Emergency Medicine | Admitting: Emergency Medicine

## 2016-04-24 ENCOUNTER — Emergency Department (HOSPITAL_COMMUNITY): Payer: Medicare Other

## 2016-04-24 ENCOUNTER — Encounter (HOSPITAL_COMMUNITY): Payer: Self-pay | Admitting: *Deleted

## 2016-04-24 DIAGNOSIS — Y92410 Unspecified street and highway as the place of occurrence of the external cause: Secondary | ICD-10-CM | POA: Insufficient documentation

## 2016-04-24 DIAGNOSIS — Y999 Unspecified external cause status: Secondary | ICD-10-CM | POA: Insufficient documentation

## 2016-04-24 DIAGNOSIS — S3993XA Unspecified injury of pelvis, initial encounter: Secondary | ICD-10-CM | POA: Diagnosis present

## 2016-04-24 DIAGNOSIS — Y939 Activity, unspecified: Secondary | ICD-10-CM | POA: Insufficient documentation

## 2016-04-24 DIAGNOSIS — Z7982 Long term (current) use of aspirin: Secondary | ICD-10-CM | POA: Insufficient documentation

## 2016-04-24 DIAGNOSIS — I10 Essential (primary) hypertension: Secondary | ICD-10-CM | POA: Diagnosis not present

## 2016-04-24 DIAGNOSIS — Z96649 Presence of unspecified artificial hip joint: Secondary | ICD-10-CM | POA: Insufficient documentation

## 2016-04-24 DIAGNOSIS — Z79899 Other long term (current) drug therapy: Secondary | ICD-10-CM | POA: Insufficient documentation

## 2016-04-24 DIAGNOSIS — R102 Pelvic and perineal pain: Secondary | ICD-10-CM

## 2016-04-24 NOTE — ED Notes (Signed)
Pt transported to xray 
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
## 2016-04-24 NOTE — ED Notes (Signed)
Patient able to ambulate independently  
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
## 2016-09-08 ENCOUNTER — Emergency Department (HOSPITAL_COMMUNITY): Payer: Medicare Other

## 2016-09-08 ENCOUNTER — Inpatient Hospital Stay (HOSPITAL_COMMUNITY)
Admission: EM | Admit: 2016-09-08 | Discharge: 2016-09-10 | DRG: 310 | Disposition: A | Discharge status: Home / Self Care | Payer: Medicare Other | Attending: Internal Medicine | Admitting: Internal Medicine

## 2016-09-08 ENCOUNTER — Encounter (HOSPITAL_COMMUNITY): Payer: Self-pay | Admitting: Emergency Medicine

## 2016-09-08 DIAGNOSIS — Z79899 Other long term (current) drug therapy: Secondary | ICD-10-CM

## 2016-09-08 DIAGNOSIS — E785 Hyperlipidemia, unspecified: Secondary | ICD-10-CM | POA: Diagnosis present

## 2016-09-08 DIAGNOSIS — M069 Rheumatoid arthritis, unspecified: Secondary | ICD-10-CM | POA: Diagnosis present

## 2016-09-08 DIAGNOSIS — Z7982 Long term (current) use of aspirin: Secondary | ICD-10-CM

## 2016-09-08 DIAGNOSIS — R748 Abnormal levels of other serum enzymes: Secondary | ICD-10-CM | POA: Diagnosis not present

## 2016-09-08 DIAGNOSIS — I110 Hypertensive heart disease with heart failure: Secondary | ICD-10-CM | POA: Diagnosis not present

## 2016-09-08 DIAGNOSIS — R7989 Other specified abnormal findings of blood chemistry: Secondary | ICD-10-CM | POA: Diagnosis present

## 2016-09-08 DIAGNOSIS — Z8249 Family history of ischemic heart disease and other diseases of the circulatory system: Secondary | ICD-10-CM | POA: Diagnosis not present

## 2016-09-08 DIAGNOSIS — K219 Gastro-esophageal reflux disease without esophagitis: Secondary | ICD-10-CM | POA: Diagnosis not present

## 2016-09-08 DIAGNOSIS — Z881 Allergy status to other antibiotic agents status: Secondary | ICD-10-CM

## 2016-09-08 DIAGNOSIS — I4819 Other persistent atrial fibrillation: Secondary | ICD-10-CM | POA: Diagnosis present

## 2016-09-08 DIAGNOSIS — I340 Nonrheumatic mitral (valve) insufficiency: Secondary | ICD-10-CM | POA: Diagnosis not present

## 2016-09-08 DIAGNOSIS — I119 Hypertensive heart disease without heart failure: Secondary | ICD-10-CM | POA: Diagnosis present

## 2016-09-08 DIAGNOSIS — I4891 Unspecified atrial fibrillation: Secondary | ICD-10-CM

## 2016-09-08 DIAGNOSIS — I509 Heart failure, unspecified: Secondary | ICD-10-CM | POA: Diagnosis not present

## 2016-09-08 DIAGNOSIS — R778 Other specified abnormalities of plasma proteins: Secondary | ICD-10-CM | POA: Diagnosis present

## 2016-09-08 DIAGNOSIS — I481 Persistent atrial fibrillation: Secondary | ICD-10-CM | POA: Diagnosis not present

## 2016-09-08 HISTORY — DX: Rheumatoid arthritis, unspecified: M06.9

## 2016-09-08 HISTORY — DX: Unspecified intestinal obstruction, unspecified as to partial versus complete obstruction: K56.609

## 2016-09-08 LAB — BASIC METABOLIC PANEL
ANION GAP: 6 (ref 5–15)
BUN: 18 mg/dL (ref 6–20)
CALCIUM: 9.8 mg/dL (ref 8.9–10.3)
CO2: 27 mmol/L (ref 22–32)
Chloride: 105 mmol/L (ref 101–111)
Creatinine, Ser: 0.64 mg/dL (ref 0.44–1.00)
Glucose, Bld: 113 mg/dL — ABNORMAL HIGH (ref 65–99)
POTASSIUM: 3.6 mmol/L (ref 3.5–5.1)
SODIUM: 138 mmol/L (ref 135–145)

## 2016-09-08 LAB — CBC
HEMATOCRIT: 39.6 % (ref 36.0–46.0)
HEMOGLOBIN: 12.8 g/dL (ref 12.0–15.0)
MCH: 30.5 pg (ref 26.0–34.0)
MCHC: 32.3 g/dL (ref 30.0–36.0)
MCV: 94.5 fL (ref 78.0–100.0)
Platelets: 212 10*3/uL (ref 150–400)
RBC: 4.19 MIL/uL (ref 3.87–5.11)
RDW: 13.8 % (ref 11.5–15.5)
WBC: 9.1 10*3/uL (ref 4.0–10.5)

## 2016-09-08 LAB — I-STAT TROPONIN, ED: Troponin i, poc: 0.12 ng/mL (ref 0.00–0.08)

## 2016-09-08 MED ORDER — ENOXAPARIN SODIUM 80 MG/0.8ML ~~LOC~~ SOLN
1.0000 mg/kg | Freq: Once | SUBCUTANEOUS | Status: AC
  Administered 2016-09-09: 70 mg via SUBCUTANEOUS
  Filled 2016-09-08: qty 0.8

## 2016-09-08 MED ORDER — DILTIAZEM LOAD VIA INFUSION
10.0000 mg | Freq: Once | INTRAVENOUS | Status: AC
  Administered 2016-09-09: 10 mg via INTRAVENOUS
  Filled 2016-09-08: qty 10

## 2016-09-08 MED ORDER — DILTIAZEM HCL 100 MG IV SOLR
5.0000 mg/h | INTRAVENOUS | Status: AC
  Administered 2016-09-09: 5 mg/h via INTRAVENOUS
  Filled 2016-09-08: qty 100

## 2016-09-08 NOTE — ED Notes (Signed)
ED Provider at bedside. 

## 2016-09-08 NOTE — ED Triage Notes (Signed)
Pt c/o palpitation, central cp radiating to right shoulder, SOB and right side HA that started today around 8 pm.

## 2016-09-08 NOTE — ED Provider Notes (Signed)
[facility] DEPT Provider Note   CSN: [id number] Arrival date & time: 09/08/16  2229 By signing my name below, I, Stacy [name], attest that this documentation has been prepared under the direction and in the presence of Orpah Greek, MD. Electronically Signed: Dyke [name], Scribe. 09/09/2016. 3:34 AM    History   Chief Complaint Chief Complaint  Patient presents with  . Shortness of Breath  . Chest Pain  . Headache    HPI Stacy [name] is a 83 y.o. female with a PMHx of HTN who presents to the Emergency Department complaining of sudden onset, persistent palpitations which began tonight at 7:45 PM. Pt states she was baking a cake when her heart palpitations began. She notes associated SOB, diaphoresis, right shoulder pain, and right sided headache. No alleviating or modifying factors noted.  No prior treatments indicated. Pt also endorses some lower back pain, but pt states this began earlier today. She states she has occasional heart palpitations, but they typically last a few seconds and then resolve. She has never been evaluated for this problem. Pt denies any chest pain, neck pain, or upper back pain.  The history is provided by the patient. No language interpreter was used.   Past Medical History:  Diagnosis Date  . Bowel obstruction   . Diastolic dysfunction   . Gallbladder disease   . GERD (gastroesophageal reflux disease)   . Hypertension   . LVH (left ventricular hypertrophy)    WITH ASYMMETRIC SEPTAL HYPERTROPHY  . Mitral regurgitation   . Rheumatoid arthritis [facility])     Patient Active Problem List   Diagnosis Date Noted  . Atrial fibrillation with RVR ([place]) 09/09/2016  . GERD (gastroesophageal reflux disease) 09/09/2016  . HTN (hypertension) 06/02/2011  . LVH (left ventricular hypertrophy) 06/02/2011  . Hyperlipidemia 06/02/2011    Past Surgical History:  Procedure Laterality Date  . CARDIOVASCULAR STRESS TEST  03/10/2006   EF 69%. NO EVIDENCE  OF ISCHEMIA  . CHOLECYSTECTOMY    . TOTAL HIP ARTHROPLASTY    . US ECHOCARDIOGRAPHY  02/21/2008   EF 55-60%  . US ECHOCARDIOGRAPHY  07/21/2005   EF 55-60%    OB History    No data available       Home Medications    Prior to Admission medications   Medication Sig Start Date End Date Taking? Authorizing Provider  amLODipine (NORVASC) 2.5 MG tablet Take 2.5 mg by mouth daily.  03/28/14  Yes Historical Provider, MD  aspirin 81 MG tablet Take 81 mg by mouth daily.   Yes Historical Provider, MD  escitalopram (LEXAPRO) 5 MG tablet Take 5 mg by mouth every morning. 04/21/16  Yes Historical Provider, MD  folic acid (FOLVITE) 1 MG tablet Take 3 mg by mouth daily.    Yes Historical Provider, MD  meclizine (ANTIVERT) 25 MG tablet Take 25 mg by mouth daily as needed. As needed for dizziness 02/07/15  Yes Historical Provider, MD  methotrexate (RHEUMATREX) 2.5 MG tablet Take 15 mg by mouth every Saturday. Caution:Chemotherapy. Protect from light.    Yes Historical Provider, MD  metoprolol tartrate (LOPRESSOR) 25 MG tablet Take 1 tablet (25 mg total) by mouth 2 (two) times daily. 10/14/15  Yes Thayer Headings, MD  omeprazole (PRILOSEC) 20 MG capsule Take 20 mg by mouth daily.  03/26/15  Yes Historical Provider, MD  rOPINIRole (REQUIP) 1 MG tablet Take 1 mg by mouth at bedtime.  03/27/15  Yes Historical Provider, MD  valsartan (DIOVAN) 160 MG tablet Take  160 mg by mouth daily. 03/26/15  Yes Historical Provider, MD  atorvastatin (LIPITOR) 10 MG tablet Take 1 tablet (10 mg total) by mouth daily. Patient not taking: Reported on 09/09/2016 05/02/13   Thayer Headings, MD  traMADol (ULTRAM) 50 MG tablet Take 50 mg by mouth daily as needed. As needed for moderate pain 02/12/15   Historical Provider, MD    Family History Family History  Problem Relation Age of Onset  . Leukemia Father   . Heart attack Brother   . Hypertension Mother   . Stroke Mother     Social History Social History  Substance Use Topics  .  Smoking status: Never Smoker  . Smokeless tobacco: Never Used  . Alcohol use No     Allergies   Sulfa antibiotics   Review of Systems Review of Systems 10 systems reviewed and all are negative for acute change except as noted in the HPI.   Physical Exam Updated Vital Signs BP 128/69   Pulse 80   Temp 98.1 F (36.7 C) (Oral)   Resp 20   Ht 5\' 2"  (1.575 m)   Wt 150 lb (68 kg)   SpO2 94%   BMI 27.44 kg/m   Physical Exam  Constitutional: She is oriented to person, place, and time. She appears well-developed and well-nourished. No distress.  HENT:  Head: Normocephalic and atraumatic.  Right Ear: Hearing normal.  Left Ear: Hearing normal.  Nose: Nose normal.  Mouth/Throat: Oropharynx is clear and moist and mucous membranes are normal.  Eyes: Conjunctivae and EOM are normal. Pupils are equal, round, and reactive to light.  Neck: Normal range of motion. Neck supple.  Cardiovascular: Regular rhythm, S1 normal and S2 normal.  Exam reveals no gallop and no friction rub.   No murmur heard. Pulmonary/Chest: Effort normal and breath sounds normal. No respiratory distress. She exhibits no tenderness.  Abdominal: Soft. Normal appearance and bowel sounds are normal. There is no hepatosplenomegaly. There is no tenderness. There is no rebound, no guarding, no tenderness at McBurney's point and negative Murphy's sign. No hernia.  Musculoskeletal: Normal range of motion.  Neurological: She is alert and oriented to person, place, and time. She has normal strength. No cranial nerve deficit or sensory deficit. Coordination normal. GCS eye subscore is 4. GCS verbal subscore is 5. GCS motor subscore is 6.  Skin: Skin is warm, dry and intact. No rash noted. No cyanosis.  Psychiatric: She has a normal mood and affect. Her speech is normal and behavior is normal. Thought content normal.  Nursing note and vitals reviewed.  ED Treatments / Results  DIAGNOSTIC STUDIES:  Oxygen Saturation is 95% on  RA, adequate by my interpretation.    COORDINATION OF CARE:  11:22 PM Discussed treatment plan with pt at bedside and pt agreed to plan.  Labs (all labs ordered are listed, but only abnormal results are displayed) Labs Reviewed  BASIC METABOLIC PANEL - Abnormal; Notable for the following:       Result Value   Glucose, Bld 113 (*)    All other components within normal limits  I-STAT TROPOININ, ED - Abnormal; Notable for the following:    Troponin i, poc 0.12 (*)    All other components within normal limits  I-STAT TROPOININ, ED - Abnormal; Notable for the following:    Troponin i, poc 0.40 (*)    All other components within normal limits  CBC  TROPONIN I  TROPONIN I  TROPONIN I  MAGNESIUM  EKG  EKG Interpretation  Date/Time:  Wednesday September 08 2016 22:39:56 EST Ventricular Rate:  117 PR Interval:    QRS Duration: 104 QT Interval:  322 QTC Calculation: 449 R Axis:   70 Text Interpretation:  Atrial fibrillation with rapid ventricular response Marked ST abnormality, possible inferior subendocardial injury Abnormal ECG Confirmed by Betsey Holiday  MD, Jeannia Tatro [phone]) on 09/08/2016 11:05:19 PM       Radiology Dg Chest 2 View  Result Date: 09/08/2016 CLINICAL DATA:  83 year old female with palpitation and shortness of breath. History of heart murmur EXAM: CHEST  2 VIEW COMPARISON:  Chest radiograph dated 12/31/2009 FINDINGS: There is mild cardiomegaly with central vascular prominence compatible with mild congestive changes. There is mild interstitial and interlobular septal prominence and Kerley B-lines compatible with mild interstitial edema. Pneumonia is not excluded. Clinical correlation recommended. There is no pleural effusion or pneumothorax. There is mild tortuosity of the thoracic aorta. No acute osseous pathology. IMPRESSION: Mild cardiomegaly with mild CHF and interstitial edema. Pneumonia is not excluded. Clinical correlation and follow-up recommended. Electronically  Signed   By: Anner Crete M.D.   On: 09/08/2016 23:09    Procedures Procedures (including critical care time)  Medications Ordered in ED Medications  diltiazem (CARDIZEM) 1 mg/mL load via infusion 10 mg (10 mg Intravenous Bolus from Bag 09/09/16 0037)    And  diltiazem (CARDIZEM) 100 mg in dextrose 5 % 100 mL (1 mg/mL) infusion (5 mg/hr Intravenous Rate/Dose Verify 09/09/16 0335)  enoxaparin (LOVENOX) injection 70 mg (70 mg Subcutaneous Given 09/09/16 0032)  acetaminophen (TYLENOL) tablet 650 mg (650 mg Oral Given 09/09/16 0233)  aspirin chewable tablet 324 mg (324 mg Oral Given 09/09/16 0453)  potassium chloride SA (K-DUR,KLOR-CON) CR tablet 20 mEq (20 mEq Oral Given 09/09/16 0453)     Initial Impression / Assessment and Plan / ED Course  I have reviewed the triage vital signs and the nursing notes.  Pertinent labs & imaging results that were available during my care of the patient were reviewed by me and considered in my medical decision making (see chart for details).  Clinical Course as of Sep 09 501  Thu Sep 09, 2016  0148 Symptoms improved. Abnormal rhythm, rate controlled in the 80's.   [EH]    Clinical Course User Index [EH] Stacy [name]   Patient presents with complaints of chest discomfort, shortness of breath, palpitations and racing heartbeat. Symptoms began at 7:45 PM. She has never had this happen before, although it does report occasional palpitations in the past that was never worked up. They're always self-limited. Patient does not have any known coronary artery disease.  Patient's heart rate was in the 130s at arrival. Her point-of-care troponin was elevated. This was discussed with Dr. Emilio Aspen, on call for cardiology. He felt that the patient was not a candidate for cardioversion, recommended rate control and Lovenox. He also recommended getting a second troponin before determining disposition. Patient was initiated on Cardizem with excellent rate control but has  not converted to sinus rhythm. Second troponin was elevated to 0.40. This was once again discussed with Dr. Emilio Aspen. As the patient's symptoms are improved, he did not feel that the patient would require urgent intervention. Recommended hospitalist admission with cardiology consultation.   Final Clinical Impressions(s) / ED Diagnoses   Final diagnoses:  Atrial fibrillation with RVR (H[facility])    New Prescriptions New Prescriptions   No medications on file  I personally performed the services described in this documentation, which was scribed  in my presence. The recorded information has been reviewed and is accurate.    Orpah Greek, MD 09/09/16 [phone]

## 2016-09-09 ENCOUNTER — Encounter (HOSPITAL_COMMUNITY): Payer: Self-pay | Admitting: Internal Medicine

## 2016-09-09 ENCOUNTER — Inpatient Hospital Stay (HOSPITAL_COMMUNITY): Payer: Medicare Other

## 2016-09-09 DIAGNOSIS — K219 Gastro-esophageal reflux disease without esophagitis: Secondary | ICD-10-CM | POA: Diagnosis not present

## 2016-09-09 DIAGNOSIS — R778 Other specified abnormalities of plasma proteins: Secondary | ICD-10-CM | POA: Diagnosis present

## 2016-09-09 DIAGNOSIS — M069 Rheumatoid arthritis, unspecified: Secondary | ICD-10-CM | POA: Diagnosis present

## 2016-09-09 DIAGNOSIS — I4891 Unspecified atrial fibrillation: Secondary | ICD-10-CM | POA: Diagnosis present

## 2016-09-09 DIAGNOSIS — R748 Abnormal levels of other serum enzymes: Secondary | ICD-10-CM

## 2016-09-09 DIAGNOSIS — R7989 Other specified abnormal findings of blood chemistry: Secondary | ICD-10-CM

## 2016-09-09 DIAGNOSIS — I340 Nonrheumatic mitral (valve) insufficiency: Secondary | ICD-10-CM | POA: Diagnosis present

## 2016-09-09 DIAGNOSIS — Z7982 Long term (current) use of aspirin: Secondary | ICD-10-CM | POA: Diagnosis not present

## 2016-09-09 DIAGNOSIS — I481 Persistent atrial fibrillation: Secondary | ICD-10-CM | POA: Diagnosis not present

## 2016-09-09 DIAGNOSIS — I4819 Other persistent atrial fibrillation: Secondary | ICD-10-CM

## 2016-09-09 DIAGNOSIS — E785 Hyperlipidemia, unspecified: Secondary | ICD-10-CM | POA: Diagnosis not present

## 2016-09-09 DIAGNOSIS — Z881 Allergy status to other antibiotic agents status: Secondary | ICD-10-CM | POA: Diagnosis not present

## 2016-09-09 DIAGNOSIS — Z79899 Other long term (current) drug therapy: Secondary | ICD-10-CM | POA: Diagnosis not present

## 2016-09-09 DIAGNOSIS — I509 Heart failure, unspecified: Secondary | ICD-10-CM | POA: Diagnosis not present

## 2016-09-09 DIAGNOSIS — Z8249 Family history of ischemic heart disease and other diseases of the circulatory system: Secondary | ICD-10-CM | POA: Diagnosis not present

## 2016-09-09 DIAGNOSIS — I110 Hypertensive heart disease with heart failure: Secondary | ICD-10-CM | POA: Diagnosis not present

## 2016-09-09 HISTORY — DX: Other persistent atrial fibrillation: I48.19

## 2016-09-09 HISTORY — DX: Nonrheumatic mitral (valve) insufficiency: I34.0

## 2016-09-09 LAB — BASIC METABOLIC PANEL
Anion gap: 7 (ref 5–15)
BUN: 13 mg/dL (ref 6–20)
CHLORIDE: 106 mmol/L (ref 101–111)
CO2: 25 mmol/L (ref 22–32)
Calcium: 9.1 mg/dL (ref 8.9–10.3)
Creatinine, Ser: 0.61 mg/dL (ref 0.44–1.00)
GFR calc Af Amer: >60 mL/min (ref >60)
GFR calc non Af Amer: >60 mL/min (ref >60)
GLUCOSE: 99 mg/dL (ref 65–99)
POTASSIUM: 4 mmol/L (ref 3.5–5.1)
SODIUM: 138 mmol/L (ref 135–145)

## 2016-09-09 LAB — TROPONIN I
Troponin I: 0.71 ng/mL (ref <0.03)
Troponin I: 0.59 ng/mL (ref <0.03)
Troponin I: 0.52 ng/mL (ref <0.03)

## 2016-09-09 LAB — BRAIN NATRIURETIC PEPTIDE: B Natriuretic Peptide: 379.5 pg/mL — ABNORMAL HIGH (ref 0.0–100.0)

## 2016-09-09 LAB — ECHOCARDIOGRAM COMPLETE
HEIGHTINCHES: 62 in
WEIGHTICAEL: 2400 [oz_av]

## 2016-09-09 LAB — I-STAT TROPONIN, ED: TROPONIN I, POC: 0.4 ng/mL — AB (ref 0.00–0.08)

## 2016-09-09 LAB — MAGNESIUM: MAGNESIUM: 2.1 mg/dL (ref 1.7–2.4)

## 2016-09-09 LAB — MRSA PCR SCREENING: MRSA by PCR: NEGATIVE

## 2016-09-09 LAB — HEPARIN LEVEL (UNFRACTIONATED): Heparin Unfractionated: 1.01 IU/mL — ABNORMAL HIGH (ref 0.30–0.70)

## 2016-09-09 MED ORDER — POTASSIUM CHLORIDE CRYS ER 20 MEQ PO TBCR
20.0000 meq | EXTENDED_RELEASE_TABLET | Freq: Once | ORAL | Status: AC
  Administered 2016-09-09: 20 meq via ORAL
  Filled 2016-09-09: qty 1

## 2016-09-09 MED ORDER — FOLIC ACID 1 MG PO TABS
3.0000 mg | ORAL_TABLET | Freq: Every day | ORAL | Status: DC
  Administered 2016-09-09 – 2016-09-10 (×2): 3 mg via ORAL
  Filled 2016-09-09 (×2): qty 3

## 2016-09-09 MED ORDER — ACETAMINOPHEN 325 MG PO TABS
650.0000 mg | ORAL_TABLET | Freq: Once | ORAL | Status: AC
  Administered 2016-09-09: 650 mg via ORAL
  Filled 2016-09-09: qty 2

## 2016-09-09 MED ORDER — MECLIZINE HCL 25 MG PO TABS: 25.0000 mg | ORAL_TABLET | Freq: Three times a day (TID) | ORAL | Status: DC | PRN

## 2016-09-09 MED ORDER — ENOXAPARIN SODIUM 40 MG/0.4ML ~~LOC~~ SOLN: 40.0000 mg | SUBCUTANEOUS | Status: DC

## 2016-09-09 MED ORDER — FAMOTIDINE 20 MG PO TABS
20.0000 mg | ORAL_TABLET | Freq: Once | ORAL | Status: AC
  Administered 2016-09-09: 20 mg via ORAL
  Filled 2016-09-09: qty 1

## 2016-09-09 MED ORDER — HEPARIN (PORCINE) IN NACL 100-0.45 UNIT/ML-% IJ SOLN
850.0000 [IU]/h | INTRAMUSCULAR | Status: DC
  Administered 2016-09-09: 950 [IU]/h via INTRAVENOUS
  Administered 2016-09-10: 750 [IU]/h via INTRAVENOUS
  Filled 2016-09-09 (×2): qty 250

## 2016-09-09 MED ORDER — ROPINIROLE HCL 1 MG PO TABS
1.0000 mg | ORAL_TABLET | Freq: Every day | ORAL | Status: DC
  Administered 2016-09-09: 1 mg via ORAL
  Filled 2016-09-09: qty 1

## 2016-09-09 MED ORDER — HEPARIN BOLUS VIA INFUSION
4000.0000 [IU] | Freq: Once | INTRAVENOUS | Status: AC
  Administered 2016-09-09: 4000 [IU] via INTRAVENOUS
  Filled 2016-09-09: qty 4000

## 2016-09-09 MED ORDER — METOPROLOL TARTRATE 25 MG PO TABS
25.0000 mg | ORAL_TABLET | Freq: Two times a day (BID) | ORAL | Status: DC
  Administered 2016-09-09 – 2016-09-10 (×3): 25 mg via ORAL
  Filled 2016-09-09 (×3): qty 1

## 2016-09-09 MED ORDER — DILTIAZEM HCL ER COATED BEADS 180 MG PO CP24
180.0000 mg | ORAL_CAPSULE | Freq: Every day | ORAL | Status: DC
  Administered 2016-09-09 – 2016-09-10 (×2): 180 mg via ORAL
  Filled 2016-09-09 (×2): qty 1

## 2016-09-09 MED ORDER — ASPIRIN 81 MG PO CHEW
324.0000 mg | CHEWABLE_TABLET | Freq: Once | ORAL | Status: AC
  Administered 2016-09-09: 324 mg via ORAL
  Filled 2016-09-09: qty 4

## 2016-09-09 MED ORDER — TRAMADOL HCL 50 MG PO TABS
50.0000 mg | ORAL_TABLET | Freq: Two times a day (BID) | ORAL | Status: DC | PRN
  Administered 2016-09-10: 50 mg via ORAL
  Filled 2016-09-09: qty 1

## 2016-09-09 MED ORDER — ESCITALOPRAM OXALATE 10 MG PO TABS
5.0000 mg | ORAL_TABLET | Freq: Every day | ORAL | Status: DC
  Administered 2016-09-09 – 2016-09-10 (×2): 5 mg via ORAL
  Filled 2016-09-09 (×2): qty 1

## 2016-09-09 MED ORDER — PANTOPRAZOLE SODIUM 40 MG PO TBEC
40.0000 mg | DELAYED_RELEASE_TABLET | Freq: Every day | ORAL | Status: DC
Start: 2016-09-09 — End: 2016-09-10
  Administered 2016-09-09 – 2016-09-10 (×2): 40 mg via ORAL
  Filled 2016-09-09 (×2): qty 1

## 2016-09-09 MED ORDER — SODIUM CHLORIDE 0.9% FLUSH
3.0000 mL | Freq: Two times a day (BID) | INTRAVENOUS | Status: DC
  Administered 2016-09-09: 3 mL via INTRAVENOUS

## 2016-09-09 MED ORDER — IRBESARTAN 150 MG PO TABS
150.0000 mg | ORAL_TABLET | Freq: Every day | ORAL | Status: DC
  Administered 2016-09-09 – 2016-09-10 (×2): 150 mg via ORAL
  Filled 2016-09-09 (×2): qty 1

## 2016-09-09 NOTE — Care Management Note (Signed)
Case Management Note  Patient Details  Name: [place] MRN: [id number] Date of Birth: Sep 25, 1928  Subjective/Objective:    Adm w at fib w rvr, pos troponins                Action/Plan: lives w husb, supp da, pcp dr perini   Expected Discharge Date:                  Expected Discharge Plan:  [place]  In-House Referral:     Discharge planning Services     Post Acute Care Choice:    Choice offered to:     DME Arranged:    DME Agency:     HH Arranged:    [place] Agency:     Status of Service:  In process, will continue to follow  If discussed at Long Length of Stay Meetings, dates discussed:    Additional Comments: will moniter for dc pl as pt progresses.  Lacretia Leigh, RN 09/09/2016, 9:39 AM

## 2016-09-09 NOTE — Progress Notes (Addendum)
ANTICOAGULATION CONSULT NOTE - Initial Consult  Pharmacy Consult for Heparin Indication: atrial fibrillation  Allergies  Allergen Reactions  . Sulfa Antibiotics Nausea Only    Patient Measurements: Height: 5\' 2"  (157.5 cm) Weight: 150 lb (68 kg) IBW/kg (Calculated) : 50.1 Heparin Dosing Weight: 68 kg  Vital Signs: Temp: 98.1 F (36.7 C) (02/21 2242) Temp Source: Oral (02/21 2242) BP: 133/69 (02/22 0500) Pulse Rate: 76 (02/22 0500)  Labs:  Recent Labs  09/08/16 2243  HGB 12.8  HCT 39.6  PLT 212  CREATININE 0.64    Estimated Creatinine Clearance: 46.5 mL/min (by C-G formula based on SCr of 0.64 mg/dL).   Medical History: Past Medical History:  Diagnosis Date  . Bowel obstruction   . Diastolic dysfunction   . Gallbladder disease   . GERD (gastroesophageal reflux disease)   . Hypertension   . LVH (left ventricular hypertrophy)    WITH ASYMMETRIC SEPTAL HYPERTROPHY  . Mitral regurgitation   . Rheumatoid arthritis ([place])     Medications:  See electronic med rec  Assessment: 83 y.o. F presents with CP/SOB - found to be in afib. To begin heparin gtt. CBC ok at baseline. No AC PTA.  Goal of Therapy:  Heparin level 0.3-0.7 units/ml Monitor platelets by anticoagulation protocol: Yes   Plan:  Heparin IV bolus 4000 units Heparin gtt at 950 units/hr Will f/u heparin level in 8 hours Daily heparin level and CBC  Sherlon Handing, PharmD, BCPS Clinical pharmacist, pager [phone] 09/09/2016,5:20 AM   Addendum: Missed fact that pt received Lovenox 70 mg (full dose) at 0030 this morning. Safety zone filled out. First level may be elevated as bolus given and not needed.  Sherlon Handing, PharmD, BCPS Clinical pharmacist, pager [phone] 09/09/2016 6:47 AM

## 2016-09-09 NOTE — H&P (Signed)
History and Physical    [name] [id number] DOB: 05-26-1929 DOA: 09/08/2016  PCP: Jerlyn Ly, MD   Patient coming from: Home.  Chief Complaint: Palpitations.  HPI: Stacy [name] is a 83 y.o. female with medical history significant of bowel obstruction, diastolic dysfunction, LVH with asymmetric septal hypertrophy, mitral regurgitation, gallbladder disease, GERD, hypertension, rheumatoid arthritis who is coming to the emergency department with complaints of palpitations that started around 15 minutes before 20/400 last evening while she was making a cake.  The patient states that she felt her heart racing, associated with dyspnea, mild lightheadedness, mild diaphoresis, headache and right shoulder pain. She denies precordial pain, pitting edema of the lower extremities, PND or orthopnea. She denies any episodes of palpitations prior to today. She denies fever, chills, productive cough, nausea, emesis, diarrhea or constipation. She denies dysuria or hematuria.  ED Course: EKG show atrial fibrillation with RVR. Troponin level was 0.12, then 0.40. WBC 9.1, hemoglobin 12.8 g/dL and platelets 212. Her potassium level was 3.6 and glucose 113, otherwise chemistry was within normal limits.  Imaging: Chest radiograph show mild cardiomegaly with interstitial edema.  Review of Systems: As per HPI otherwise 10 point review of systems negative.   Past Medical History:  Diagnosis Date  . Bowel obstruction   . Diastolic dysfunction   . Gallbladder disease   . GERD (gastroesophageal reflux disease)   . Hypertension   . LVH (left ventricular hypertrophy)    WITH ASYMMETRIC SEPTAL HYPERTROPHY  . Mitral regurgitation   . Rheumatoid arthritis [facility])     Past Surgical History:  Procedure Laterality Date  . CARDIOVASCULAR STRESS TEST  03/10/2006   EF 69%. NO EVIDENCE OF ISCHEMIA  . CHOLECYSTECTOMY    . TOTAL HIP ARTHROPLASTY    . US ECHOCARDIOGRAPHY  02/21/2008   EF 55-60%  . US  ECHOCARDIOGRAPHY  07/21/2005   EF 55-60%     reports that she has never smoked. She has never used smokeless tobacco. She reports that she does not drink alcohol or use drugs.  Allergies  Allergen Reactions  . Sulfa Antibiotics Nausea Only    Family History  Problem Relation Age of Onset  . Leukemia Father   . Heart attack Brother   . Hypertension Mother   . Stroke Mother     Prior to Admission medications   Medication Sig Start Date End Date Taking? Authorizing Provider  amLODipine (NORVASC) 2.5 MG tablet Take 2.5 mg by mouth daily.  03/28/14  Yes Historical Provider, MD  aspirin 81 MG tablet Take 81 mg by mouth daily.   Yes Historical Provider, MD  escitalopram (LEXAPRO) 5 MG tablet Take 5 mg by mouth every morning. 04/21/16  Yes Historical Provider, MD  folic acid (FOLVITE) 1 MG tablet Take 3 mg by mouth daily.    Yes Historical Provider, MD  meclizine (ANTIVERT) 25 MG tablet Take 25 mg by mouth daily as needed. As needed for dizziness 02/07/15  Yes Historical Provider, MD  methotrexate (RHEUMATREX) 2.5 MG tablet Take 15 mg by mouth every Saturday. Caution:Chemotherapy. Protect from light.    Yes Historical Provider, MD  metoprolol tartrate (LOPRESSOR) 25 MG tablet Take 1 tablet (25 mg total) by mouth 2 (two) times daily. 10/14/15  Yes Thayer Headings, MD  omeprazole (PRILOSEC) 20 MG capsule Take 20 mg by mouth daily.  03/26/15  Yes Historical Provider, MD  rOPINIRole (REQUIP) 1 MG tablet Take 1 mg by mouth at bedtime.  03/27/15  Yes Historical Provider, MD  valsartan (DIOVAN) 160 MG tablet Take 160 mg by mouth daily. 03/26/15  Yes Historical Provider, MD  atorvastatin (LIPITOR) 10 MG tablet Take 1 tablet (10 mg total) by mouth daily. Patient not taking: Reported on 09/09/2016 05/02/13   Thayer Headings, MD  traMADol (ULTRAM) 50 MG tablet Take 50 mg by mouth daily as needed. As needed for moderate pain 02/12/15   Historical Provider, MD    Physical Exam:  Constitutional: NAD, calm,  comfortable Vitals:   09/09/16 0400 09/09/16 0430 09/09/16 0500 09/09/16 0530  BP: 110/66 128/69 133/69 128/67  Pulse: 87 80 76 88  Resp: 18 20 22 19   Temp:      TempSrc:      SpO2: 95% 94% 98% 99%  Weight:      Height:       Eyes: PERRL, lids and conjunctivae normal ENMT: Mucous membranes are moist. Posterior pharynx clear of any exudate or lesions. Neck: normal, supple, no masses, no thyromegaly Respiratory: clear to auscultation bilaterally, no wheezing, no crackles. Normal respiratory effort. No accessory muscle use.  Cardiovascular: Irregularly irregular, positive systolic murmur / rubs / gallops. No extremity edema. 2+ pedal pulses. No carotid bruits.  Abdomen: Soft, no tenderness, no masses palpated. No hepatosplenomegaly. Bowel sounds positive.  Musculoskeletal: no clubbing / cyanosis. Good ROM, no contractures. Normal muscle tone.  Skin: no rashes, lesions, ulcers on limited skin exam. Neurologic: CN 2-12 grossly intact. Sensation intact, DTR normal. Strength 5/5 in all 4.  Psychiatric: Normal judgment and insight. Alert and oriented x 3. Normal mood.    Labs on Admission: I have personally reviewed following labs and imaging studies  CBC:  Recent Labs Lab 09/08/16 2243  WBC 9.1  HGB 12.8  HCT 39.6  MCV 94.5  PLT 99991111   Basic Metabolic Panel:  Recent Labs Lab 09/08/16 2243  NA 138  K 3.6  CL 105  CO2 27  GLUCOSE 113*  BUN 18  CREATININE 0.64  CALCIUM 9.8   GFR: Estimated Creatinine Clearance: 46.5 mL/min (by C-G formula based on SCr of 0.64 mg/dL). Liver Function Tests: No results for input(s): AST, ALT, ALKPHOS, BILITOT, PROT, ALBUMIN in the last 168 hours. No results for input(s): LIPASE, AMYLASE in the last 168 hours. No results for input(s): AMMONIA in the last 168 hours. Coagulation Profile: No results for input(s): INR, PROTIME in the last 168 hours. Cardiac Enzymes: No results for input(s): CKTOTAL, CKMB, CKMBINDEX, TROPONINI in the last 168  hours. BNP (last 3 results) No results for input(s): PROBNP in the last 8760 hours. HbA1C: No results for input(s): HGBA1C in the last 72 hours. CBG: No results for input(s): GLUCAP in the last 168 hours. Lipid Profile: No results for input(s): CHOL, HDL, LDLCALC, TRIG, CHOLHDL, LDLDIRECT in the last 72 hours. Thyroid Function Tests: No results for input(s): TSH, T4TOTAL, FREET4, T3FREE, THYROIDAB in the last 72 hours. Anemia Panel: No results for input(s): VITAMINB12, FOLATE, FERRITIN, TIBC, IRON, RETICCTPCT in the last 72 hours. Urine analysis: No results found for: COLORURINE, APPEARANCEUR, LABSPEC, PHURINE, GLUCOSEU, HGBUR, BILIRUBINUR, KETONESUR, PROTEINUR, UROBILINOGEN, NITRITE, LEUKOCYTESUR  Radiological Exams on Admission: Dg Chest 2 View  Result Date: 09/08/2016 CLINICAL DATA:  83 year old female with palpitation and shortness of breath. History of heart murmur EXAM: CHEST  2 VIEW COMPARISON:  Chest radiograph dated 12/31/2009 FINDINGS: There is mild cardiomegaly with central vascular prominence compatible with mild congestive changes. There is mild interstitial and interlobular septal prominence and Kerley B-lines compatible with mild interstitial edema. Pneumonia  is not excluded. Clinical correlation recommended. There is no pleural effusion or pneumothorax. There is mild tortuosity of the thoracic aorta. No acute osseous pathology. IMPRESSION: Mild cardiomegaly with mild CHF and interstitial edema. Pneumonia is not excluded. Clinical correlation and follow-up recommended. Electronically Signed   By: Anner Crete M.D.   On: 09/08/2016 23:09    EKG: Independently reviewed. Vent. rate 117 BPM PR interval * ms QRS duration 104 ms QT/QTc 322/449 ms P-R-T axes * 70 129 Atrial fibrillation with rapid ventricular response Marked ST abnormality, possible inferior subendocardial injury Abnormal ECG Assessment/Plan Principal Problem:   Atrial fibrillation with RVR (HCC) Admit  to stepdown/inpatient. Continue supplemental oxygen. Continue Cardizem infusion. Continue metoprolol 25 mg by mouth twice a day. Trend troponin levels. Check echocardiogram. Optimize electrolytes. Consult cardiology in a.m.  Active Problems:   Elevated troponin Continue cardiac monitoring. Trend troponin level. Check echocardiogram. Please consult cardiology in a.m.    HTN (hypertension) Call amlodipine while on Cardizem. Continue metoprolol 25 mg by mouth twice a day. Continue valsartan 160 mg by mouth daily. Monitor blood pressure, renal function and electrolytes.    GERD (gastroesophageal reflux disease) Protonix 40 mg by mouth daily.    Rheumatoid arthritis (HCC) Continue weekly methotrexate. Continue daily folate. Analgesics as needed.    Mitral regurgitation Stable. Check echocardiogram.    DVT prophylaxis: Heparin infusion. Code Status: Full code. Family Communication: Her daughter and spouse were present in the room. Disposition Plan: Admit for rate control and heparin infusion. Consults called: Cardiology was called by the ED. Please reconsult in a.m. Admission status: Inpatient/stepdown.   Reubin Milan MD [facility] Pager [phone].  If 7PM-7AM, please contact night-coverage [email or web address] Password [facility]  09/09/2016, 6:16 AM

## 2016-09-09 NOTE — Progress Notes (Signed)
PROGRESS NOTE  Stacy [name] [id number] DOB: 05/11/1929 DOA: 09/08/2016 PCP: Jerlyn Ly, MD  HPI/Recap of past 24 hours:  Heart rate better, remain in afib, denies chest pain, no sob, bp stable Family at bedside  Assessment/Plan: Principal Problem:   Atrial fibrillation with RVR ([place]) Active Problems:   HTN (hypertension)   GERD (gastroesophageal reflux disease)   Rheumatoid arthritis ([place])   Mitral regurgitation   Elevated troponin  New onset of Afib/RVR Check tsh, keep mag >2, k>4, echo cardiogram pending Heart rate has improved on cardizem drip, now transition to oral cardizem, continue betablocker She remained on heparin drip Cardiology consulted, input appreciated  Troponin elevation: likely due to rapid afib, she denies chest pain On heparin drip, Echo pending, repeat ekg in am for interval changes, check lipid panel Cardiology following   HTN: continue betablocker, valsatan, cardizem is new from this hospitalization  Rheumatoid arthritis:  Continue home meds methotraxate, folic acid  Code Status: full  Family Communication: patient , husband and daughter at bedside  Disposition Plan: remain in the hospital, possible d/c in 24-48hr if clinically stable, all meds transition to oral and with cardiology clearance   Consultants:  cardiology  Procedures:  none  Antibiotics:  none   Objective: BP 133/67 (BP Location: Left Arm)   Pulse 75   Temp 97.4 F (36.3 C) (Oral)   Resp 19   Ht 5\' 2"  (1.575 m)   Wt 68 kg (150 lb)   SpO2 95%   BMI 27.44 kg/m   Intake/Output Summary (Last 24 hours) at 09/09/16 1142 Last data filed at 09/09/16 [id number]  Gross per 24 hour  Intake                0 ml  Output              500 ml  Net             -500 ml   Filed Weights   09/08/16 2239  Weight: 68 kg (150 lb)    Exam:   General:  NAD  Cardiovascular: IRRR  Respiratory: CTABL  Abdomen: Soft/ND/NT, positive BS  Musculoskeletal: No  Edema  Neuro: aaox3  Data Reviewed: Basic Metabolic Panel:  Recent Labs Lab 09/08/16 2243 09/09/16 0732  NA 138  --   K 3.6  --   CL 105  --   CO2 27  --   GLUCOSE 113*  --   BUN 18  --   CREATININE 0.64  --   CALCIUM 9.8  --   MG  --  2.1   Liver Function Tests: No results for input(s): AST, ALT, ALKPHOS, BILITOT, PROT, ALBUMIN in the last 168 hours. No results for input(s): LIPASE, AMYLASE in the last 168 hours. No results for input(s): AMMONIA in the last 168 hours. CBC:  Recent Labs Lab 09/08/16 2243  WBC 9.1  HGB 12.8  HCT 39.6  MCV 94.5  PLT 212   Cardiac Enzymes:    Recent Labs Lab 09/09/16 0732  TROPONINI 0.71*   BNP (last 3 results)  Recent Labs  09/09/16 0732  BNP 379.5*    ProBNP (last 3 results) No results for input(s): PROBNP in the last 8760 hours.  CBG: No results for input(s): GLUCAP in the last 168 hours.  Recent Results (from the past 240 hour(s))  MRSA PCR Screening     Status: None   Collection Time: 09/09/16  6:26 AM  Result Value Ref Range Status  MRSA by PCR NEGATIVE NEGATIVE Final    Comment:        The GeneXpert MRSA Assay (FDA approved for NASAL specimens only), is one component of a comprehensive MRSA colonization surveillance program. It is not intended to diagnose MRSA infection nor to guide or monitor treatment for MRSA infections.      Studies: Dg Chest 2 View  Result Date: 09/08/2016 CLINICAL DATA:  83 year old female with palpitation and shortness of breath. History of heart murmur EXAM: CHEST  2 VIEW COMPARISON:  Chest radiograph dated 12/31/2009 FINDINGS: There is mild cardiomegaly with central vascular prominence compatible with mild congestive changes. There is mild interstitial and interlobular septal prominence and Kerley B-lines compatible with mild interstitial edema. Pneumonia is not excluded. Clinical correlation recommended. There is no pleural effusion or pneumothorax. There is mild tortuosity  of the thoracic aorta. No acute osseous pathology. IMPRESSION: Mild cardiomegaly with mild CHF and interstitial edema. Pneumonia is not excluded. Clinical correlation and follow-up recommended. Electronically Signed   By: Anner Crete M.D.   On: 09/08/2016 23:09    Scheduled Meds: . diltiazem  180 mg Oral Daily  . escitalopram  5 mg Oral Daily  . folic acid  3 mg Oral Daily  . irbesartan  150 mg Oral Daily  . metoprolol tartrate  25 mg Oral BID  . pantoprazole  40 mg Oral Daily  . rOPINIRole  1 mg Oral QHS  . sodium chloride flush  3 mL Intravenous Q12H    Continuous Infusions: . diltiazem (CARDIZEM) infusion 5 mg/hr (09/09/16 0335)  . heparin 950 Units/hr (09/09/16 0557)      Sherry Blackard MD, PhD  [facility] Pager [phone]. If 7PM-7AM, please contact night-coverage at [email or web address], password [facility] 09/09/2016, 11:42 AM  LOS: 0 days

## 2016-09-09 NOTE — Progress Notes (Signed)
Patient transferring to 2W28, family at bedside made aware of transfer. Report called to Lauren RN.  Kamaiyah Uselton, Tivis Ringer, RN

## 2016-09-09 NOTE — Progress Notes (Signed)
[place] for Heparin Indication: atrial fibrillation  Allergies  Allergen Reactions  . Sulfa Antibiotics Nausea Only    Patient Measurements: Height: 5\' 2"  (157.5 cm) Weight: 150 lb (68 kg) IBW/kg (Calculated) : 50.1 Heparin Dosing Weight: 68 kg  Vital Signs: Temp: 98.1 F (36.7 C) (02/22 1213) Temp Source: Oral (02/22 1213) BP: 113/71 (02/22 1213) Pulse Rate: 63 (02/22 1213)  Labs:  Recent Labs  09/08/16 2243 09/09/16 0732 09/09/16 1145 09/09/16 1413  HGB 12.8  --   --   --   HCT 39.6  --   --   --   PLT 212  --   --   --   HEPARINUNFRC  --   --   --  1.01*  CREATININE 0.64  --  0.61  --   TROPONINI  --  0.71*  --   --     Estimated Creatinine Clearance: 46.5 mL/min (by C-G formula based on SCr of 0.61 mg/dL).   Medical History: Past Medical History:  Diagnosis Date  . Bowel obstruction   . Diastolic dysfunction   . Gallbladder disease   . GERD (gastroesophageal reflux disease)   . Hypertension   . LVH (left ventricular hypertrophy)    WITH ASYMMETRIC SEPTAL HYPERTROPHY  . Mitral regurgitation   . Rheumatoid arthritis ([place])     Medications:  See electronic med rec  Assessment: 83 y.o. F presents with CP/SOB - found to be in afib. To begin heparin gtt. CBC ok at baseline. No AC PTA.  Initial heparin level high at 1.0, some of the elevated level is likely due to full dose lovenox given around midnight last night. At this time we are ~14 hours after lovenox dose so will consider level higher than expected and drop heparin infusion rate down and recheck tonight.   Goal of Therapy:  Heparin level 0.3-0.7 units/ml Monitor platelets by anticoagulation protocol: Yes   Plan:  Decrease Heparin gtt to 750 units/hr Will f/u heparin level in 8 hours Daily heparin level and CBC  Erin Hearing PharmD., BCPS Clinical Pharmacist Pager [phone] 09/09/2016 3:15 PM

## 2016-09-09 NOTE — ED Notes (Addendum)
Admitting Provider at bedside. 

## 2016-09-09 NOTE — ED Notes (Signed)
Attempted to call report

## 2016-09-09 NOTE — Progress Notes (Signed)
  Echocardiogram 2D Echocardiogram has been performed.  Donata Clay 09/09/2016, 3:28 PM

## 2016-09-09 NOTE — Consult Note (Signed)
CONSULT NOTE  Date: 09/09/2016               Patient Name:  Stacy [name] MRN: [id number]  DOB: 02/16/1929 Age / Sex: 83 y.o., female        PCP: [name] A Primary Cardiologist: Nahser            Referring Physician: Erlinda Hong              Reason for Consult: Atrial fib            History of Present Illness: Patient is a 83 y.o. female with a PMHx of  Diastolic dysfunction, LVH with ASH, MR, HTN , who was admitted to [facility] on 09/08/2016 for evaluation of newly diagnosed atrial fib  .  I last saw Stacy [name] in March, 2017. She has a very mild dynamic LV outflow track obstruction. She has done well. She presents now with palpitations and was found have new onset atrial fibrillation.  She's had some mild shortness of breath and lightheadedness. She denies any chest pain.  Medications: Outpatient medications: Prescriptions Prior to Admission  Medication Sig Dispense Refill Last Dose  . amLODipine (NORVASC) 2.5 MG tablet Take 2.5 mg by mouth daily.    09/08/2016 at Unknown time  . aspirin 81 MG tablet Take 81 mg by mouth daily.   09/08/2016 at Unknown time  . escitalopram (LEXAPRO) 5 MG tablet Take 5 mg by mouth every morning.   09/08/2016 at Unknown time  . folic acid (FOLVITE) 1 MG tablet Take 3 mg by mouth daily.    09/08/2016 at Unknown time  . meclizine (ANTIVERT) 25 MG tablet Take 25 mg by mouth daily as needed. As needed for dizziness   PRN  . methotrexate (RHEUMATREX) 2.5 MG tablet Take 15 mg by mouth every Saturday. Caution:Chemotherapy. Protect from light.    09/04/2016  . metoprolol tartrate (LOPRESSOR) 25 MG tablet Take 1 tablet (25 mg total) by mouth 2 (two) times daily. 180 tablet 3 09/08/2016 at 0900  . omeprazole (PRILOSEC) 20 MG capsule Take 20 mg by mouth daily.    09/08/2016 at Unknown time  . rOPINIRole (REQUIP) 1 MG tablet Take 1 mg by mouth at bedtime.    09/07/2016  . valsartan (DIOVAN) 160 MG tablet Take 160 mg by mouth daily.   09/08/2016 at Unknown time  .  atorvastatin (LIPITOR) 10 MG tablet Take 1 tablet (10 mg total) by mouth daily. (Patient not taking: Reported on 09/09/2016) 30 tablet 5 Not Taking at Unknown time  . traMADol (ULTRAM) 50 MG tablet Take 50 mg by mouth daily as needed. As needed for moderate pain   PRN    Current medications: Current Facility-Administered Medications  Medication Dose Route Frequency Provider Last Rate Last Dose  . diltiazem (CARDIZEM) 100 mg in dextrose 5 % 100 mL (1 mg/mL) infusion  5-15 mg/hr Intravenous Continuous Orpah Greek, MD 5 mL/hr at 09/09/16 0335 5 mg/hr at 09/09/16 0335  . escitalopram (LEXAPRO) tablet 5 mg  5 mg Oral Daily Reubin Milan, MD   5 mg at 09/09/16 0908  . folic acid (FOLVITE) tablet 3 mg  3 mg Oral Daily Reubin Milan, MD   3 mg at 09/09/16 [id number]  . heparin ADULT infusion 100 units/mL (25000 units/293mL sodium chloride 0.45%)  950 Units/hr Intravenous Continuous Franky Macho, RPH 9.5 mL/hr at 09/09/16 0557 950 Units/hr at 09/09/16 0557  . irbesartan (AVAPRO) tablet 150 mg  150  mg Oral Daily Reubin Milan, MD   150 mg at 09/09/16 [id number]  . meclizine (ANTIVERT) tablet 25 mg  25 mg Oral TID PRN Reubin Milan, MD      . metoprolol tartrate (LOPRESSOR) tablet 25 mg  25 mg Oral BID Reubin Milan, MD   25 mg at 09/09/16 [id number]  . pantoprazole (PROTONIX) EC tablet 40 mg  40 mg Oral Daily Reubin Milan, MD   40 mg at 09/09/16 [id number]  . rOPINIRole (REQUIP) tablet 1 mg  1 mg Oral QHS Reubin Milan, MD      . sodium chloride flush (NS) 0.9 % injection 3 mL  3 mL Intravenous Q12H Reubin Milan, MD      . traMADol Veatrice Bourbon) tablet 50 mg  50 mg Oral Q12H PRN Reubin Milan, MD         Allergies  Allergen Reactions  . Sulfa Antibiotics Nausea Only     Past Medical History:  Diagnosis Date  . Bowel obstruction   . Diastolic dysfunction   . Gallbladder disease   . GERD (gastroesophageal reflux disease)   . Hypertension   . LVH (left ventricular  hypertrophy)    WITH ASYMMETRIC SEPTAL HYPERTROPHY  . Mitral regurgitation   . Rheumatoid arthritis [facility])     Past Surgical History:  Procedure Laterality Date  . CARDIOVASCULAR STRESS TEST  03/10/2006   EF 69%. NO EVIDENCE OF ISCHEMIA  . CHOLECYSTECTOMY    . TOTAL HIP ARTHROPLASTY    . US ECHOCARDIOGRAPHY  02/21/2008   EF 55-60%  . US ECHOCARDIOGRAPHY  07/21/2005   EF 55-60%    Family History  Problem Relation Age of Onset  . Leukemia Father   . Heart attack Brother   . Hypertension Mother   . Stroke Mother     Social History:  reports that she has never smoked. She has never used smokeless tobacco. She reports that she does not drink alcohol or use drugs.   Review of Systems: Constitutional:  denies fever, chills, diaphoresis, appetite change and fatigue.  HEENT: denies photophobia, eye pain, redness, hearing loss, ear pain, congestion, sore throat, rhinorrhea, sneezing, neck pain, neck stiffness and tinnitus.  Respiratory: denies SOB, DOE, cough, chest tightness, and wheezing.  Cardiovascular: admits to  palpitations    Gastrointestinal: denies nausea, vomiting, abdominal pain, diarrhea, constipation, blood in stool.  Genitourinary: denies dysuria, urgency, frequency, hematuria, flank pain and difficulty urinating.  Musculoskeletal: denies  myalgias, back pain, joint swelling, arthralgias and gait problem.   Skin: denies pallor, rash and wound.  Neurological: denies dizziness, seizures, syncope, weakness, light-headedness, numbness and headaches.   Hematological: denies adenopathy, easy bruising, personal or family bleeding history.  Psychiatric/ Behavioral: denies suicidal ideation, mood changes, confusion, nervousness, sleep disturbance and agitation.    Physical Exam: BP 133/67 (BP Location: Left Arm)   Pulse 75   Temp 97.4 F (36.3 C) (Oral)   Resp 19   Ht 5\' 2"  (1.575 m)   Wt 150 lb (68 kg)   SpO2 95%   BMI 27.44 kg/m   Wt Readings from Last 3 Encounters:    09/08/16 150 lb (68 kg)  04/24/16 150 lb 3 oz (68.1 kg)  10/14/15 149 lb 12.8 oz (67.9 kg)    General: Vital signs reviewed and noted. Well-developed, well-nourished, in no acute distress; alert,   Head: Normocephalic, atraumatic, sclera anicteric,   Neck: Supple. Negative for carotid bruits. No JVD   Lungs:  Clear bilaterally, no  wheezes, rales, or rhonchi. Breathing is normal   Heart: Irreg. Irreg.  with S1 S2.  2/6 systolic murmur   Abdomen/ GI :  Soft, non-tender, non-distended with normoactive bowel sounds. No hepatomegaly. No rebound/guarding. No obvious abdominal masses   MSK: Strength and the appear normal for age.   Extremities: No clubbing or cyanosis. No edema.  Distal pedal pulses are 2+ and equal   Neurologic:  CN are grossly intact,  No obvious motor or sensory defect.  Alert and oriented X 3. Moves all extremities spontaneously.  Psych: Responds to questions appropriately with a normal affect.     Lab results: Basic Metabolic Panel:  Recent Labs Lab 09/08/16 2243 09/09/16 0732  NA 138  --   K 3.6  --   CL 105  --   CO2 27  --   GLUCOSE 113*  --   BUN 18  --   CREATININE 0.64  --   CALCIUM 9.8  --   MG  --  2.1    Liver Function Tests: No results for input(s): AST, ALT, ALKPHOS, BILITOT, PROT, ALBUMIN in the last 168 hours. No results for input(s): LIPASE, AMYLASE in the last 168 hours. No results for input(s): AMMONIA in the last 168 hours.  CBC:  Recent Labs Lab 09/08/16 2243  WBC 9.1  HGB 12.8  HCT 39.6  MCV 94.5  PLT 212    Cardiac Enzymes:  Recent Labs Lab 09/09/16 0732  TROPONINI 0.71*     BNP: Invalid input(s): POCBNP  CBG: No results for input(s): GLUCAP in the last 168 hours.  Coagulation Studies: No results for input(s): LABPROT, INR in the last 72 hours.   Other results:  Personal review of EKG shows :  -atrial fib With heart rate of 117. She has nonspecific ST changes.   Imaging: Dg Chest 2 View  Result Date:  09/08/2016 CLINICAL DATA:  83 year old female with palpitation and shortness of breath. History of heart murmur EXAM: CHEST  2 VIEW COMPARISON:  Chest radiograph dated 12/31/2009 FINDINGS: There is mild cardiomegaly with central vascular prominence compatible with mild congestive changes. There is mild interstitial and interlobular septal prominence and Kerley B-lines compatible with mild interstitial edema. Pneumonia is not excluded. Clinical correlation recommended. There is no pleural effusion or pneumothorax. There is mild tortuosity of the thoracic aorta. No acute osseous pathology. IMPRESSION: Mild cardiomegaly with mild CHF and interstitial edema. Pneumonia is not excluded. Clinical correlation and follow-up recommended. Electronically Signed   By: Anner Crete M.D.   On: 09/08/2016 23:09        Assessment & Plan:   1. Persistent atrial fibrillation: Mrs. Ramsell now presents with palpitations and is found to have atrial fibrillation with a rapid ventricular response. Her heart rate has slowed on diltiazem drip and she's feeling quite a bit better. She's on heparin drip. Her CHADS2VASC score is 66 (female, age 24,   Hypertension) She'll need anticoagulation long-term.  Will start eliquis or Xarelto tomorrow  We'll concentrate on rate control and anticoagulation for now. She seems to be doing well with the addition of diltiazem. We'll transition the IV diltiazem to by mouth diltiazem. When we see her in follow-up and we can consider whether or not to perform a cardioversion.  2. Minimally elevated troponin levels: This is likely due to demand ischemia with her rapid heart rate. She is 83 years old and has never had any episodes of angina. I discussed the possibility of doing a heart catheterization on  her. At this point, both of [place] agree that she does not need a heart catheterization. We can always consider her for cardiac cath if she starts having episodes of chest discomfort or other signs  of ischemic heart disease. We will anticipate continuing heparin for today and will start her on oral anticoagulation tomorrow assuming that she does not have any other signs or symptoms of ischemic heart disease. We will repeat her EKG today.  3. Dynamic LVOT obstruction: She seems to be doing well. Continue with the addition of diltiazem.     Thayer Headings, Brooke Bonito., MD, [facility] 09/09/2016, 10:10 AM Office - [phone] Pager [phone]

## 2016-09-10 ENCOUNTER — Telehealth: Payer: Self-pay | Admitting: Cardiovascular Disease

## 2016-09-10 DIAGNOSIS — I4891 Unspecified atrial fibrillation: Secondary | ICD-10-CM | POA: Diagnosis not present

## 2016-09-10 DIAGNOSIS — R748 Abnormal levels of other serum enzymes: Secondary | ICD-10-CM | POA: Diagnosis not present

## 2016-09-10 DIAGNOSIS — I481 Persistent atrial fibrillation: Secondary | ICD-10-CM | POA: Diagnosis not present

## 2016-09-10 LAB — CBC
HEMATOCRIT: 38.6 % (ref 36.0–46.0)
HEMOGLOBIN: 12.3 g/dL (ref 12.0–15.0)
MCH: 30.7 pg (ref 26.0–34.0)
MCHC: 31.9 g/dL (ref 30.0–36.0)
MCV: 96.3 fL (ref 78.0–100.0)
Platelets: 202 10*3/uL (ref 150–400)
RBC: 4.01 MIL/uL (ref 3.87–5.11)
RDW: 14.4 % (ref 11.5–15.5)
WBC: 7.9 10*3/uL (ref 4.0–10.5)

## 2016-09-10 LAB — LIPID PANEL
CHOLESTEROL: 185 mg/dL (ref 0–200)
HDL: 43 mg/dL (ref >40)
LDL Cholesterol: 115 mg/dL — ABNORMAL HIGH (ref 0–99)
Total CHOL/HDL Ratio: 4.3 RATIO
Triglycerides: 136 mg/dL (ref <150)
VLDL: 27 mg/dL (ref 0–40)

## 2016-09-10 LAB — COMPREHENSIVE METABOLIC PANEL
ALK PHOS: 56 U/L (ref 38–126)
ALT: 16 U/L (ref 14–54)
ANION GAP: 7 (ref 5–15)
AST: 23 U/L (ref 15–41)
Albumin: 3.5 g/dL (ref 3.5–5.0)
BILIRUBIN TOTAL: 0.7 mg/dL (ref 0.3–1.2)
BUN: 13 mg/dL (ref 6–20)
CALCIUM: 9.1 mg/dL (ref 8.9–10.3)
CO2: 26 mmol/L (ref 22–32)
Chloride: 105 mmol/L (ref 101–111)
Creatinine, Ser: 0.71 mg/dL (ref 0.44–1.00)
Glucose, Bld: 103 mg/dL — ABNORMAL HIGH (ref 65–99)
Potassium: 4 mmol/L (ref 3.5–5.1)
Sodium: 138 mmol/L (ref 135–145)
TOTAL PROTEIN: 6 g/dL — AB (ref 6.5–8.1)

## 2016-09-10 LAB — TSH: TSH: 1.531 u[IU]/mL (ref 0.350–4.500)

## 2016-09-10 LAB — HEPARIN LEVEL (UNFRACTIONATED)
Heparin Unfractionated: 0.53 IU/mL (ref 0.30–0.70)
Heparin Unfractionated: 0.25 IU/mL — ABNORMAL LOW (ref 0.30–0.70)

## 2016-09-10 LAB — MAGNESIUM: MAGNESIUM: 2.1 mg/dL (ref 1.7–2.4)

## 2016-09-10 MED ORDER — DILTIAZEM HCL ER COATED BEADS 180 MG PO CP24: 180.0000 mg | ORAL_CAPSULE | Freq: Every day | ORAL | 0 refills | Status: DC

## 2016-09-10 MED ORDER — APIXABAN 5 MG PO TABS
5.0000 mg | ORAL_TABLET | Freq: Two times a day (BID) | ORAL | Status: DC
  Administered 2016-09-10: 5 mg via ORAL
  Filled 2016-09-10: qty 1

## 2016-09-10 MED ORDER — APIXABAN 5 MG PO TABS: 5.0000 mg | ORAL_TABLET | Freq: Two times a day (BID) | ORAL | 0 refills | Status: DC

## 2016-09-10 NOTE — Discharge Summary (Signed)
Discharge Summary  Stacy [name] [id number] DOB: 1929/01/13  PCP: Jerlyn Ly, MD  Admit date: 09/08/2016 Discharge date: 09/10/2016  Time spent: <60mins  Recommendations for Outpatient Follow-up:  1. F/u with PMD within a week  for hospital discharge follow up, repeat cbc/bmp at follow up 2. F/u with cardiology for afib  Discharge Diagnoses:  Active Hospital Problems   Diagnosis Date Noted  . Atrial fibrillation with RVR ([place]) 09/09/2016  . GERD (gastroesophageal reflux disease) 09/09/2016  . Rheumatoid arthritis ([place]) 09/09/2016  . Mitral regurgitation 09/09/2016  . Elevated troponin 09/09/2016  . HTN (hypertension) 06/02/2011    Resolved Hospital Problems   Diagnosis Date Noted Date Resolved  No resolved problems to display.    Discharge Condition: stable  Diet recommendation: heart healthy  Filed Weights   09/08/16 2239 09/09/16 1919  Weight: 68 kg (150 lb) 68.4 kg (150 lb 12.7 oz)    History of present illness:  Patient coming from: Home.  Chief Complaint: Palpitations.  HPI: Stacy [name] is a 83 y.o. female with medical history significant of bowel obstruction, diastolic dysfunction, LVH with asymmetric septal hypertrophy, mitral regurgitation, gallbladder disease, GERD, hypertension, rheumatoid arthritis who is coming to the emergency department with complaints of palpitations that started around 15 minutes before 20/400 last evening while she was making a cake.  The patient states that she felt her heart racing, associated with dyspnea, mild lightheadedness, mild diaphoresis, headache and right shoulder pain. She denies precordial pain, pitting edema of the lower extremities, PND or orthopnea. She denies any episodes of palpitations prior to today. She denies fever, chills, productive cough, nausea, emesis, diarrhea or constipation. She denies dysuria or hematuria.  ED Course: EKG show atrial fibrillation with RVR. Troponin level was 0.12, then  0.40. WBC 9.1, hemoglobin 12.8 g/dL and platelets 212. Her potassium level was 3.6 and glucose 113, otherwise chemistry was within normal limits.  Imaging: Chest radiograph show mild cardiomegaly with interstitial edema.  Hospital Course:  Principal Problem:   Atrial fibrillation with RVR (H[facility]) Active Problems:   HTN (hypertension)   GERD (gastroesophageal reflux disease)   Rheumatoid arthritis ([facility])   Mitral regurgitation   Elevated troponin   New onset of Afib/RVR tsh 1.5, keep mag >2, k>4, echo cardiogram with normal LV function, no wall motion abnormality, severe dilated LA, severe MR, PA pressure of 54mm HG. Heart rate has improved on cardizem drip, now transition to oral cardizem, continue betablocker She is started  on heparin drip from admission, this has changed to eliquis Her heart rate is controlled, bp stable, no chest pain,  she is cleared to discharge home with outpatient cardiology follow up  Troponin elevation: likely due to rapid afib, she denies chest pain Troponin peaked at 0.7 trended down No plan for cath this hospitalization, she is to follow with Cardiology outpatient She is on asa/statin/betablocker.  Dynamic LVOT obstruction: echo shows dynamic obstruction, with a peak velocity of 484 cm/sec and a peak gradient of 94 mm Hg Denies chest pain, no syncope, no sob, she is on betablocker and ccb, avoid dehydration,  She is to follow up with cardiology  HTN: continue betablocker, valsatan, cardizem is new from this hospitalization  Rheumatoid arthritis:  Continue home meds methotraxate, folic acid  Code Status: full  Family Communication: patient , husband and daughter at bedside  Disposition Plan:  d/c home with cardiology clearance   Consultants:  cardiology  Procedures:  none  Antibiotics:  none   Discharge Exam: BP 130/63 (  BP Location: Left Arm)   Pulse 62   Temp 97.5 F (36.4 C) (Oral)   Resp 18   Ht 5\' 2"  (1.575 m)    Wt 68.4 kg (150 lb 12.7 oz)   SpO2 97%   BMI 27.58 kg/m   General: NAD Cardiovascular: IRRR, +murmur Respiratory: CTABL Extremity: no edema  Discharge Instructions You were cared for by a hospitalist during your hospital stay. If you have any questions about your discharge medications or the care you received while you were in the hospital after you are discharged, you can call the unit and asked to speak with the hospitalist on call if the hospitalist that took care of you is not available. Once you are discharged, your primary care physician will handle any further medical issues. Please note that NO REFILLS for any discharge medications will be authorized once you are discharged, as it is imperative that you return to your primary care physician (or establish a relationship with a primary care physician if you do not have one) for your aftercare needs so that they can reassess your need for medications and monitor your lab values.  Discharge Instructions    Diet - low sodium heart healthy    Complete by:  As directed    Increase activity slowly    Complete by:  As directed      Allergies as of 09/10/2016      Reactions   Sulfa Antibiotics Nausea Only      Medication List    STOP taking these medications   amLODipine 2.5 MG tablet Commonly known as:  NORVASC     TAKE these medications   apixaban 5 MG Tabs tablet Commonly known as:  ELIQUIS Take 1 tablet (5 mg total) by mouth 2 (two) times daily.   aspirin 81 MG tablet Take 81 mg by mouth daily.   atorvastatin 10 MG tablet Commonly known as:  LIPITOR Take 1 tablet (10 mg total) by mouth daily.   diltiazem 180 MG 24 hr capsule Commonly known as:  CARDIZEM CD Take 1 capsule (180 mg total) by mouth daily. Start taking on:  09/11/2016   escitalopram 5 MG tablet Commonly known as:  LEXAPRO Take 5 mg by mouth every morning.   folic acid 1 MG tablet Commonly known as:  FOLVITE Take 3 mg by mouth daily.   meclizine  25 MG tablet Commonly known as:  ANTIVERT Take 25 mg by mouth daily as needed. As needed for dizziness   methotrexate 2.5 MG tablet Commonly known as:  RHEUMATREX Take 15 mg by mouth every Saturday. Caution:Chemotherapy. Protect from light.   metoprolol tartrate 25 MG tablet Commonly known as:  LOPRESSOR Take 1 tablet (25 mg total) by mouth 2 (two) times daily.   omeprazole 20 MG capsule Commonly known as:  PRILOSEC Take 20 mg by mouth daily.   rOPINIRole 1 MG tablet Commonly known as:  REQUIP Take 1 mg by mouth at bedtime.   traMADol 50 MG tablet Commonly known as:  ULTRAM Take 50 mg by mouth daily as needed. As needed for moderate pain   valsartan 160 MG tablet Commonly known as:  DIOVAN Take 160 mg by mouth daily.      Allergies  Allergen Reactions  . Sulfa Antibiotics Nausea Only   Follow-up Information    PERINI,MARK A, MD Follow up in 1 week(s).   Specialty:  Internal Medicine Why:  hospital discharge follow up Contact information: [address] [place] [address] [phone]  [name], MD Follow up in 3 week(s).   Specialty:  Cardiology Why:  afib Contact information: [place] [address] [phone]            The results of significant diagnostics from this hospitalization (including imaging, microbiology, ancillary and laboratory) are listed below for reference.    Significant Diagnostic Studies: Dg Chest 2 View  Result Date: 09/08/2016 CLINICAL DATA:  83 year old female with palpitation and shortness of breath. History of heart murmur EXAM: CHEST  2 VIEW COMPARISON:  Chest radiograph dated 12/31/2009 FINDINGS: There is mild cardiomegaly with central vascular prominence compatible with mild congestive changes. There is mild interstitial and interlobular septal prominence and Kerley B-lines compatible with mild interstitial edema. Pneumonia is not excluded. Clinical correlation recommended. There  is no pleural effusion or pneumothorax. There is mild tortuosity of the thoracic aorta. No acute osseous pathology. IMPRESSION: Mild cardiomegaly with mild CHF and interstitial edema. Pneumonia is not excluded. Clinical correlation and follow-up recommended. Electronically Signed   By: Anner Crete M.D.   On: 09/08/2016 23:09    Microbiology: Recent Results (from the past 240 hour(s))  MRSA PCR Screening     Status: None   Collection Time: 09/09/16  6:26 AM  Result Value Ref Range Status   MRSA by PCR NEGATIVE NEGATIVE Final    Comment:        The GeneXpert MRSA Assay (FDA approved for NASAL specimens only), is one component of a comprehensive MRSA colonization surveillance program. It is not intended to diagnose MRSA infection nor to guide or monitor treatment for MRSA infections.      Labs: Basic Metabolic Panel:  Recent Labs Lab 09/08/16 2243 09/09/16 0732 09/09/16 1145 09/10/16 0011  NA 138  --  138 138  K 3.6  --  4.0 4.0  CL 105  --  106 105  CO2 27  --  25 26  GLUCOSE 113*  --  99 103*  BUN 18  --  13 13  CREATININE 0.64  --  0.61 0.71  CALCIUM 9.8  --  9.1 9.1  MG  --  2.1  --  2.1   Liver Function Tests:  Recent Labs Lab 09/10/16 0011  AST 23  ALT 16  ALKPHOS 56  BILITOT 0.7  PROT 6.0*  ALBUMIN 3.5   No results for input(s): LIPASE, AMYLASE in the last 168 hours. No results for input(s): AMMONIA in the last 168 hours. CBC:  Recent Labs Lab 09/08/16 2243 09/10/16 0011  WBC 9.1 7.9  HGB 12.8 12.3  HCT 39.6 38.6  MCV 94.5 96.3  PLT 212 202   Cardiac Enzymes:  Recent Labs Lab 09/09/16 0732 09/09/16 1413 09/09/16 1936  TROPONINI 0.71* 0.59* 0.52*   BNP: BNP (last 3 results)  Recent Labs  09/09/16 0732  BNP 379.5*    ProBNP (last 3 results) No results for input(s): PROBNP in the last 8760 hours.  CBG: No results for input(s): GLUCAP in the last 168 hours.     SignedFlorencia Reasons MD, PhD  [facility] 09/10/2016, 12:02 PM

## 2016-09-10 NOTE — Telephone Encounter (Signed)
error 

## 2016-09-10 NOTE — Progress Notes (Signed)
Progress Note  Patient Name: Stacy [name] Date of Encounter: 09/10/2016  Primary Cardiologist: Dr. Acie Fredrickson  Subjective   Feeling well. No chest pain, sob or palpitations.   Inpatient Medications    Scheduled Meds: . diltiazem  180 mg Oral Daily  . escitalopram  5 mg Oral Daily  . folic acid  3 mg Oral Daily  . irbesartan  150 mg Oral Daily  . metoprolol tartrate  25 mg Oral BID  . pantoprazole  40 mg Oral Daily  . rOPINIRole  1 mg Oral QHS  . sodium chloride flush  3 mL Intravenous Q12H   Continuous Infusions: . heparin 750 Units/hr (09/10/16 0005)   PRN Meds: meclizine, traMADol   Vital Signs    Vitals:   09/09/16 1213 09/09/16 1616 09/09/16 1919 09/10/16 0523  BP: 113/71 122/66 (!) 118/51 130/63  Pulse: 63 80 63 62  Resp: 18 17 18 18   Temp: 98.1 F (36.7 C) 98 F (36.7 C) 97.9 F (36.6 C) 97.5 F (36.4 C)  TempSrc: Oral Oral Oral Oral  SpO2: 95% 97% 99% 97%  Weight:   150 lb 12.7 oz (68.4 kg)   Height:        Intake/Output Summary (Last 24 hours) at 09/10/16 0914 Last data filed at 09/10/16 0820  Gross per 24 hour  Intake           517.05 ml  Output             1151 ml  Net          -633.95 ml   Filed Weights   09/08/16 2239 09/09/16 1919  Weight: 150 lb (68 kg) 150 lb 12.7 oz (68.4 kg)    Telemetry    Afib/aflutter with rate mostly in 60-70s. Intermittently in 120s - Personally Reviewed  ECG    09/10/16 Aflutter at rate of 61 bpm, non specific ST changes- Personally Reviewed  Physical Exam   GEN: No acute distress.   Neck: No JVD Cardiac: Irregular , Systolic  murmurs, rubs, or gallops.  Respiratory: Clear to auscultation bilaterally. GI: Soft, nontender, non-distended  MS: No edema; No deformity. Neuro:  Nonfocal  Psych: Normal affect   Labs    Chemistry Recent Labs Lab 09/08/16 2243 09/09/16 1145 09/10/16 0011  NA 138 138 138  K 3.6 4.0 4.0  CL 105 106 105  CO2 27 25 26   GLUCOSE 113* 99 103*  BUN 18 13 13     CREATININE 0.64 0.61 0.71  CALCIUM 9.8 9.1 9.1  PROT  --   --  6.0*  ALBUMIN  --   --  3.5  AST  --   --  23  ALT  --   --  16  ALKPHOS  --   --  56  BILITOT  --   --  0.7  GFRNONAA >60 >60 >60  GFRAA >60 >60 >60  ANIONGAP 6 7 7      Hematology Recent Labs Lab 09/08/16 2243 09/10/16 0011  WBC 9.1 7.9  RBC 4.19 4.01  HGB 12.8 12.3  HCT 39.6 38.6  MCV 94.5 96.3  MCH 30.5 30.7  MCHC 32.3 31.9  RDW 13.8 14.4  PLT 212 202    Cardiac Enzymes Recent Labs Lab 09/09/16 0732 09/09/16 1413 09/09/16 1936  TROPONINI 0.71* 0.59* 0.52*    Recent Labs Lab 09/08/16 2254 09/09/16 0158  TROPIPOC 0.12* 0.40*     BNP Recent Labs Lab 09/09/16 0732  BNP 379.5*  DDimer No results for input(s): DDIMER in the last 168 hours.   Radiology    Dg Chest 2 View  Result Date: 09/08/2016 CLINICAL DATA:  83 year old female with palpitation and shortness of breath. History of heart murmur EXAM: CHEST  2 VIEW COMPARISON:  Chest radiograph dated 12/31/2009 FINDINGS: There is mild cardiomegaly with central vascular prominence compatible with mild congestive changes. There is mild interstitial and interlobular septal prominence and Kerley B-lines compatible with mild interstitial edema. Pneumonia is not excluded. Clinical correlation recommended. There is no pleural effusion or pneumothorax. There is mild tortuosity of the thoracic aorta. No acute osseous pathology. IMPRESSION: Mild cardiomegaly with mild CHF and interstitial edema. Pneumonia is not excluded. Clinical correlation and follow-up recommended. Electronically Signed   By: Anner Crete M.D.   On: 09/08/2016 23:09    Cardiac Studies   Echo 09/09/16 Study Conclusions  - Left ventricle: The cavity size was normal. There was moderate   concentric hypertrophy with severe septal hypertrophy. Findings   consistent with hypertrophic cardiomyopathy. Systolic function   was normal. The estimated ejection fraction was in the range  of   60% to 65%. There was dynamic obstruction, with a peak velocity   of 484 cm/sec and a peak gradient of 94 mm Hg. Wall motion was   normal; there were no regional wall motion abnormalities. The   study is not technically sufficient to allow evaluation of LV   diastolic function. - Aortic valve: Transvalvular velocity was within the normal range.   There was no stenosis. There was no regurgitation. - Mitral valve: Calcified annulus. There was systolic anterior   motion. There was severe regurgitation directed posteriorly. - Left atrium: The atrium was severely dilated. - Right ventricle: Systolic function was normal. - Atrial septum: No defect or patent foramen ovale was identified   by color flow Doppler. - Tricuspid valve: There was mild regurgitation. - Pulmonary arteries: Systolic pressure was within the normal   range. PA peak pressure: 25 mm Hg (S).   Patient Profile     Patient is a 83 y.o. female with a PMHx of  Diastolic dysfunction, LVH with ASH, MR, HTN , who was admitted to [facility] on 09/08/2016 for evaluation of newly diagnosed atrial fib   Assessment & Plan    1. Persistent atrial fibrillation - Rate improved on IV Cardizem, now transition to PO Cardizem CD 180mg  qd and lopressor 25mg  BID. She is now in aflutter with slow rates in 60s.  - Her CHADS2VASC score is 29 (female, age 68,   Hypertension). Currently on IV heparin. Plan to transition to  eliquis or Xarelto today per Dr. Cathie Olden. Echo showed normal LV function, severe dilated LA, severe MR, PA pressure of 45mm HG. - She is asymptomatic currently. Likely DCCV in 3 weeks if remains in afib/aflutter.   2. Elevated troponin - Peak 0.71. Now trending down. EKG with non specific changes. No angina. Further plan per MD.   3. Dynamic LVOT obstruction - increased gradient compared to prior study. No syncope. Echo as above.   4. Severe MR on echo  - MD to review.   Signed, Leanor Kail, PA  09/10/2016, 9:14 AM      Attending Note:   The patient was seen and examined.  Agree with assessment and plan as noted above.  Changes made to the above note as needed.  Patient seen and independently examined with [name], PA .   We discussed all aspects of the encounter. I agree  with the assessment and plan as stated above.  1. Atrial fib:   Rate is well controlled.  Will start on Eliquis 5 mg PO BID  Continue PO metoprolol and diltiazem   2. Elevated Troponin:   Will continue with conservative management  With no plans for further work up at this time.  We will continue to assess in the clinic and can cath if she develops any symptoms of ischemia.    I have spent a total of 40 minutes with patient reviewing hospital  notes , telemetry, EKGs, labs and examining patient as well as establishing an assessment and plan that was discussed with the patient. > 50% of time was spent in direct patient care.    Thayer Headings, Brooke Bonito., MD, [facility] 09/10/2016, 11:39 AM [address],  [place] Pager [phone]

## 2016-09-10 NOTE — Progress Notes (Signed)
Per insurance check for [employer] S/W  [facility] @ [place] [facility] # [id number]. XARELTO  20 MG DAILY  COVER- YES  CO-PAY- $ 45.00  TIER- 3 DRUG  PRIOR APPROVAL- NO   2. ELIQUIS 5 MG BID  COVER- YES  CO-PAY- $ 45.00  TIER- 3 DRUG  PRIOR APPROVAL- NO   PHARMACY : ANY RETAIL  [facility]

## 2016-09-10 NOTE — Progress Notes (Signed)
[place] for Heparin Indication: atrial fibrillation  Allergies  Allergen Reactions  . Sulfa Antibiotics Nausea Only    Patient Measurements: Height: 5\' 2"  (157.5 cm) Weight: 150 lb 12.7 oz (68.4 kg) IBW/kg (Calculated) : 50.1 Heparin Dosing Weight: 68 kg  Vital Signs: Temp: 97.9 F (36.6 C) (02/22 1919) Temp Source: Oral (02/22 1919) BP: 118/51 (02/22 1919) Pulse Rate: 63 (02/22 1919)  Labs:  Recent Labs  09/08/16 2243 09/09/16 0732 09/09/16 1145 09/09/16 1413 09/09/16 1936 09/10/16 0011  HGB 12.8  --   --   --   --  12.3  HCT 39.6  --   --   --   --  38.6  PLT 212  --   --   --   --  202  HEPARINUNFRC  --   --   --  1.01*  --  0.53  CREATININE 0.64  --  0.61  --   --   --   TROPONINI  --  0.71*  --  0.59* 0.52*  --     Estimated Creatinine Clearance: 46.6 mL/min (by C-G formula based on SCr of 0.61 mg/dL).  Assessment: 83 y.o. F presents with CP/SOB - found to be in afib. Pt on heparin gtt. Heparin level therapeutic (0.53) on gtt at 750 units/hr. No bleeding noted.  Goal of Therapy:  Heparin level 0.3-0.7 units/ml Monitor platelets by anticoagulation protocol: Yes   Plan:  Continue heparin gtt at 750 units/hr Will f/u heparin level in 8 hours  Sherlon Handing, PharmD, BCPS Clinical pharmacist, pager [phone] 09/10/2016 1:24 AM

## 2016-09-10 NOTE — Care Management Note (Signed)
Case Management Note Previous CM note initiated by Lacretia Leigh, RN 09/09/2016, 9:39 AM   Patient Details  Name: Stacy [name] MRN: [id number] Date of Birth: 1929-04-26  Subjective/Objective:    Adm w at fib w rvr, pos troponins                Action/Plan: lives w husb, supp da, pcp dr perini   Expected Discharge Date:  09/10/16               Expected Discharge Plan:  Home/Self Care  In-House Referral:     Discharge planning Services  CM Consult, Medication Assistance  Post Acute Care Choice:  NA Choice offered to:  NA  DME Arranged:  N/A DME Agency:  NA  HH Arranged:    [place] Agency:     Status of Service:  Completed, signed off  If discussed at [employer] of Stay Meetings, dates discussed:    Additional Comments:   09/10/16- 1320- Candice Lunney RN, CM- pt for d/c home today- has been started on Eliquis for afib- insurance check completed- copay $45/mo- spke with pt at bedside- coverage info shared- 30 day free card given to pt to use on discharge- per pt she uses [facility]- call made to pharmacy- which has already filled script and is ready for pickup- they will 30 day free card when pt brings it to pharmacy.    Lacretia Leigh, RN 09/09/2016, 9:39 AM--will moniter for dc pl as pt progresses.  Dahlia Client [place], RN 09/10/2016, 1:20 PM [phone]

## 2016-09-10 NOTE — Progress Notes (Signed)
[place] for Heparin Indication: atrial fibrillation  Allergies  Allergen Reactions  . Sulfa Antibiotics Nausea Only    Patient Measurements: Height: 5\' 2"  (157.5 cm) Weight: 150 lb 12.7 oz (68.4 kg) IBW/kg (Calculated) : 50.1 Heparin Dosing Weight: 68 kg  Vital Signs: Temp: 97.5 F (36.4 C) (02/23 0523) Temp Source: Oral (02/23 0523) BP: 130/63 (02/23 0523) Pulse Rate: 62 (02/23 0523)  Labs:  Recent Labs  09/08/16 2243 09/09/16 0732 09/09/16 1145 09/09/16 1413 09/09/16 1936 09/10/16 0011 09/10/16 0939  HGB 12.8  --   --   --   --  12.3  --   HCT 39.6  --   --   --   --  38.6  --   PLT 212  --   --   --   --  202  --   HEPARINUNFRC  --   --   --  1.01*  --  0.53 0.25*  CREATININE 0.64  --  0.61  --   --  0.71  --   TROPONINI  --  0.71*  --  0.59* 0.52*  --   --     Estimated Creatinine Clearance: 46.6 mL/min (by C-G formula based on SCr of 0.71 mg/dL).  Assessment: 83 y.o. F presents with CP/SOB - found to be in afib. Pt on heparin gtt. Heparin level now subtherapeutic (0.25) on gtt at 750 units/hr. No bleeding noted.CBC stable. Renal fxn ok for NOAC.   Goal of Therapy:  Heparin level 0.3-0.7 units/ml Monitor platelets by anticoagulation protocol: Yes   Plan:  Increase heparin gtt to 850 units/hr F/u transition to [place], Pharm.D. BP:[id number] 09/10/2016 11:42 AM

## 2016-09-10 NOTE — Discharge Instructions (Signed)
Information on my medicine - ELIQUIS® (apixaban) ° °This medication education was reviewed with me or my healthcare representative as part of my discharge preparation.  The pharmacist that spoke with me during my hospital stay was:  Calistro Rauf T, RPH ° °Why was Eliquis® prescribed for you? °Eliquis® was prescribed for you to reduce the risk of a blood clot forming that can cause a stroke if you have a medical condition called atrial fibrillation (a type of irregular heartbeat). ° °What do You need to know about Eliquis® ? °Take your Eliquis® TWICE DAILY - one tablet in the morning and one tablet in the evening with or without food. If you have difficulty swallowing the tablet whole please discuss with your pharmacist how to take the medication safely. ° °Take Eliquis® exactly as prescribed by your doctor and DO NOT stop taking Eliquis® without talking to the doctor who prescribed the medication.  Stopping may increase your risk of developing a stroke.  Refill your prescription before you run out. ° °After discharge, you should have regular check-up appointments with your healthcare provider that is prescribing your Eliquis®.  In the future your dose may need to be changed if your kidney function or weight changes by a significant amount or as you get older. ° °What do you do if you miss a dose? °If you miss a dose, take it as soon as you remember on the same day and resume taking twice daily.  Do not take more than one dose of ELIQUIS at the same time to make up a missed dose. ° °Important Safety Information °A possible side effect of Eliquis® is bleeding. You should call your healthcare provider right away if you experience any of the following: °? Bleeding from an injury or your nose that does not stop. °? Unusual colored urine (red or dark brown) or unusual colored stools (red or black). °? Unusual bruising for unknown reasons. °? A serious fall or if you hit your head (even if there is no bleeding). ° °Some  medicines may interact with Eliquis® and might increase your risk of bleeding or clotting while on Eliquis®. To help avoid this, consult your healthcare provider or pharmacist prior to using any new prescription or non-prescription medications, including herbals, vitamins, non-steroidal anti-inflammatory drugs (NSAIDs) and supplements. ° °This website has more information on Eliquis® (apixaban): http://www.eliquis.com/eliquis/homeInformation on my medicine - ELIQUIS® (apixaban) ° °This medication education was reviewed with me or my healthcare representative as part of my discharge preparation.  The pharmacist that spoke with me during my hospital stay was:  Sherhonda Gaspar T, RPH ° °Why was Eliquis® prescribed for you? °Eliquis® was prescribed for you to reduce the risk of a blood clot forming that can cause a stroke if you have a medical condition called atrial fibrillation (a type of irregular heartbeat). ° °What do You need to know about Eliquis® ? °Take your Eliquis® TWICE DAILY - one tablet in the morning and one tablet in the evening with or without food. If you have difficulty swallowing the tablet whole please discuss with your pharmacist how to take the medication safely. ° °Take Eliquis® exactly as prescribed by your doctor and DO NOT stop taking Eliquis® without talking to the doctor who prescribed the medication.  Stopping may increase your risk of developing a stroke.  Refill your prescription before you run out. ° °After discharge, you should have regular check-up appointments with your healthcare provider that is prescribing your Eliquis®.  In the future your   dose may need to be changed if your kidney function or weight changes by a significant amount or as you get older. ° °What do you do if you miss a dose? °If you miss a dose, take it as soon as you remember on the same day and resume taking twice daily.  Do not take more than one dose of ELIQUIS at the same time to make up a missed  dose. ° °Important Safety Information °A possible side effect of Eliquis® is bleeding. You should call your healthcare provider right away if you experience any of the following: °? Bleeding from an injury or your nose that does not stop. °? Unusual colored urine (red or dark brown) or unusual colored stools (red or black). °? Unusual bruising for unknown reasons. °? A serious fall or if you hit your head (even if there is no bleeding). ° °Some medicines may interact with Eliquis® and might increase your risk of bleeding or clotting while on Eliquis®. To help avoid this, consult your healthcare provider or pharmacist prior to using any new prescription or non-prescription medications, including herbals, vitamins, non-steroidal anti-inflammatory drugs (NSAIDs) and supplements. ° °This website has more information on Eliquis® (apixaban): http://www.eliquis.com/eliquis/home °

## 2016-09-27 ENCOUNTER — Encounter: Payer: Self-pay | Admitting: Physician Assistant

## 2016-09-27 ENCOUNTER — Ambulatory Visit (INDEPENDENT_AMBULATORY_CARE_PROVIDER_SITE_OTHER): Payer: Medicare Other | Admitting: Physician Assistant

## 2016-09-27 VITALS — BP 140/70 | HR 55 | Ht 62.0 in | Wt 151.0 lb

## 2016-09-27 DIAGNOSIS — I481 Persistent atrial fibrillation: Secondary | ICD-10-CM

## 2016-09-27 DIAGNOSIS — R778 Other specified abnormalities of plasma proteins: Secondary | ICD-10-CM

## 2016-09-27 DIAGNOSIS — I421 Obstructive hypertrophic cardiomyopathy: Secondary | ICD-10-CM

## 2016-09-27 DIAGNOSIS — I4819 Other persistent atrial fibrillation: Secondary | ICD-10-CM

## 2016-09-27 DIAGNOSIS — I119 Hypertensive heart disease without heart failure: Secondary | ICD-10-CM

## 2016-09-27 DIAGNOSIS — I422 Other hypertrophic cardiomyopathy: Secondary | ICD-10-CM | POA: Insufficient documentation

## 2016-09-27 DIAGNOSIS — I340 Nonrheumatic mitral (valve) insufficiency: Secondary | ICD-10-CM

## 2016-09-27 DIAGNOSIS — R7989 Other specified abnormal findings of blood chemistry: Secondary | ICD-10-CM

## 2016-09-27 DIAGNOSIS — R748 Abnormal levels of other serum enzymes: Secondary | ICD-10-CM | POA: Diagnosis not present

## 2016-09-27 HISTORY — DX: Obstructive hypertrophic cardiomyopathy: I42.1

## 2016-09-27 NOTE — Progress Notes (Signed)
Cardiology Office Note:    Date:  09/27/2016   ID:  Stacy [name], DOB Jul 22, 1928, MRN [id number]  PCP:  Stacy Ly, MD  Cardiologist:  Dr. Liam [name]   Electrophysiologist:  n/a  Referring MD: Stacy Infante, MD   Chief Complaint  Patient presents with  . Hospitalization Follow-up    admx with AF with RVR    History of Present Illness:    Stacy [name] is a 83 y.o. female with a hx of HOCM (LVH with mild LVOT obstruction), mitral regurgitation, HL, RA. Last seen by Dr. Acie [name] 3/17.  Admitted 2/21-2/23 with AF with RVR and elevated Troponin levels.  CHADS2-VASc=4 (HTN, female, 83 y.o.).   She was placed on IV diltiazem for rate control. She was placed on apixaban for anticoagulation. Elevated troponin levels were felt to be secondary to demand ischemia and conservative management was planned unless she develops symptoms consistent with angina.  She returns for follow-up. She is here alone. Since DC, she has been feeling well. She denies further palpitations. She has a history of atypical chest pain. This is mainly noted with pushing on her chest. She denies exertional symptoms. Denies significant dyspnea. Denies orthopnea, PND or edema. Denies syncope. She does note a chronic history of headaches that occur when she awakens from sleep. She also notes occasional episodes of epistaxis over the years. She denies any significant change since starting on Eliquis.  Prior CV studies:   The following studies were reviewed today:  Echo 09/09/16 Moderate concentric LVH with severe septal hypertrophy-consistent with HCM, EF 60-65, dynamic obstruction with peak gradient 94, normal wall motion, MAC with + SAM, severe MR, severe LAE, normal RVSF, mild TR, PASP 25  Echo 10/16 Severe LVH, severe focal basal septal hypertrophy with moderate posterior wall hypertrophy, EF 55-60, peak gradient 32, normal wall motion, MAC, + SAM, mild to moderate MR, severe LAE, normal RVSF, trivial TR/P, PASP  28  Nuclear stress test 8/07 EF 69, apical attenuation consistent with breast artifact, no ischemia  Past Medical History:  Diagnosis Date  . Bowel obstruction   . Diastolic dysfunction   . Gallbladder disease   . GERD (gastroesophageal reflux disease)   . HOCM (hypertrophic obstructive cardiomyopathy) ([place]) 09/27/2016   Echo 10/16: severe LVH, severe focal basal sept LVH with mod post wall LVH, EF 55-60, peak 32, no RWMA, +SAM, mild to mod MR, severe LAE, PASP 28 // Echo 2/18: mod conc LVH, severe septal LVH c/w HCM, EF 60-65, dynamic obst with peak 94, no RWMA, + SAM, severe MR, severe LAE, PASP 25  . Hypertension   . LVH (left ventricular hypertrophy)    WITH ASYMMETRIC SEPTAL HYPERTROPHY  . Mitral regurgitation 09/09/2016   Severe by echo 08/2016  . Persistent atrial fibrillation ([place]) 09/09/2016  . Rheumatoid arthritis [facility])     Past Surgical History:  Procedure Laterality Date  . CARDIOVASCULAR STRESS TEST  03/10/2006   EF 69%. NO EVIDENCE OF ISCHEMIA  . CHOLECYSTECTOMY    . TOTAL HIP ARTHROPLASTY    . US ECHOCARDIOGRAPHY  02/21/2008   EF 55-60%  . US ECHOCARDIOGRAPHY  07/21/2005   EF 55-60%    Current Medications: Current Meds  Medication Sig  . apixaban (ELIQUIS) 5 MG TABS tablet Take 1 tablet (5 mg total) by mouth 2 (two) times daily.  Marland Kitchen aspirin 81 MG tablet Take 81 mg by mouth daily.  Marland Kitchen atorvastatin (LIPITOR) 10 MG tablet Take 1 tablet (10 mg total) by mouth daily.  Marland Kitchen  diltiazem (CARDIZEM CD) 180 MG 24 hr capsule Take 1 capsule (180 mg total) by mouth daily.  Marland Kitchen escitalopram (LEXAPRO) 5 MG tablet Take 5 mg by mouth every morning.  . folic acid (FOLVITE) 1 MG tablet Take 3 mg by mouth daily.   . meclizine (ANTIVERT) 25 MG tablet Take 25 mg by mouth daily as needed. As needed for dizziness  . methotrexate (RHEUMATREX) 2.5 MG tablet Take 15 mg by mouth every Saturday. Caution:Chemotherapy. Protect from light.   . metoprolol tartrate (LOPRESSOR) 25 MG tablet Take 1 tablet (25  mg total) by mouth 2 (two) times daily.  Marland Kitchen omeprazole (PRILOSEC) 20 MG capsule Take 20 mg by mouth daily.   Marland Kitchen rOPINIRole (REQUIP) 1 MG tablet Take 1 mg by mouth at bedtime.   . traMADol (ULTRAM) 50 MG tablet Take 50 mg by mouth daily as needed. As needed for moderate pain  . valsartan (DIOVAN) 160 MG tablet Take 160 mg by mouth daily.     Allergies:   Sulfa antibiotics   Social History   Social History  . Marital status: Married    Spouse name: N/A  . Number of children: N/A  . Years of education: N/A   Social History Main Topics  . Smoking status: Never Smoker  . Smokeless tobacco: Never Used  . Alcohol use No  . Drug use: No  . Sexual activity: Not Asked   Other Topics Concern  . None   Social History Narrative  . None     Family History  Problem Relation Age of Onset  . Leukemia Father   . Heart attack Brother   . Hypertension Mother   . Stroke Mother      ROS:   Please see the history of present illness.    Review of Systems  Cardiovascular: Positive for chest pain.  Musculoskeletal: Positive for joint pain.   All other systems reviewed and are negative.   EKGs/Labs/Other Test Reviewed:    EKG:  EKG is  ordered today.  The ekg ordered today demonstrates Sinus bradycardia, HR 55, normal axis, first-degree AV block, PR 282 ms, QTC 434 ms, no change from prior tracings  Recent Labs: 09/09/2016: B Natriuretic Peptide 379.5 09/10/2016: ALT 16; BUN 13; Creatinine, Ser 0.71; Hemoglobin 12.3; Magnesium 2.1; Platelets 202; Potassium 4.0; Sodium 138; TSH 1.531   Recent Lipid Panel    Component Value Date/Time   CHOL 185 09/10/2016 0012   TRIG 136 09/10/2016 0012   HDL 43 09/10/2016 0012   CHOLHDL 4.3 09/10/2016 0012   VLDL 27 09/10/2016 0012   LDLCALC 115 (H) 09/10/2016 0012     Physical Exam:    VS:  BP 140/70   Pulse (!) 55   Ht _0  (1.575 m)   Wt 151 lb (68.5 kg)   BMI 27.62 kg/m     Wt Readings from Last 3 Encounters:  09/27/16 151 lb (68.5  kg)  09/09/16 150 lb 12.7 oz (68.4 kg)  04/24/16 150 lb 3 oz (68.1 kg)     Physical Exam  Constitutional: She is oriented to person, place, and time. She appears well-developed and well-nourished. No distress.  HENT:  Head: Normocephalic and atraumatic.  Eyes: No scleral icterus.  Neck: No JVD present.  Cardiovascular: Normal rate and regular rhythm.   Murmur heard.  Systolic murmur is present with a grade of 2/6  at the upper left sternal border, lower left sternal border Pulmonary/Chest: She has no wheezes. She has no rales.  Abdominal: There is no tenderness.  Musculoskeletal: She exhibits no edema.  Neurological: She is alert and oriented to person, place, and time.  Skin: Skin is warm and dry.  Psychiatric: She has a normal mood and affect.    ASSESSMENT:    1. Persistent atrial fibrillation (HCC)   2. Elevated troponin   3. HOCM (hypertrophic obstructive cardiomyopathy) ([place])   4. Mitral valve insufficiency, unspecified etiology   5. Hypertensive heart disease without CHF    PLAN:    In order of problems listed above:  1. Persistent atrial fibrillation ([place]) -  She was recently admitted to the hospital with symptomatically to fibrillation with RVR and elevated troponin levels. She returns the office today back in normal sinus rhythm. She has felt well since DC. She has not had any issues with bleeding from Eliquis. Of note, she does have a history of morning headaches. She thinks that she might snore. She also has a history of nosebleeds which have not significantly changed since starting on Eliquis. I have asked her to follow-up with her ENT. If her headaches continue after evaluation by ENT or her husband notes apneic episodes, we should consider proceeding with sleep testing.  -  Continue beta blocker, calcium channel blocker  -  Continue Eliquis  -  BMET, CBC in 4 weeks  2. Elevated troponin - This was likely from demand ischemia. She denies anginal symptoms. She  does have atypical chest pains or more musculoskeletal. Her ECG is unchanged. She does not require further ischemic workup at this time.  3. HOCM (hypertrophic obstructive cardiomyopathy) ([place]) -  LVOT gradient significantly worsened on echocardiogram in 2/18 when compared to 2016. Her mitral regurgitation was also worse. I reviewed her echo today with Dr. Acie [name]. Question if her gradient changes and mitral regurgitation were affected by being in atrial fibrillation. Plan repeat echo in 3 months.  4. Mitral valve insufficiency, unspecified etiology - As noted, worsening MR may have been related to AF with RVR.  Repeat Echo in 3 mos.  5. Hypertensive heart disease without CHF - BP with fair control.  Continue current Rx.   Dispo:  Return in about 3 months (around 12/28/2016) for Routine Follow Up, w/ Dr. Acie [name].   Medication Adjustments/Labs and Tests Ordered: Current medicines are reviewed at length with the patient today.  Concerns regarding medicines are outlined above.  Medication changes, Labs and Tests ordered today are outlined in the Patient Instructions noted below. Patient Instructions  Medication Instructions:  Your physician recommends that you continue on your current medications as directed. Please refer to the Current Medication list given to you today.  Labwork: 1. IN 1 MONTH YOU WILL NEED A BMET, CBC DUE TO NEW START OF ELIQUIS  Testing/Procedures: 1. Your physician has requested that you have an echocardiogram. THIS IS TO BE DONE IN 3 MONTHS; YOU WILL NEED AN APPT ABOUT 1 WEEK LATER WITH DR. Acie [name]. Echocardiography is a painless test that uses sound waves to create images of your heart. It provides your doctor with information about the size and shape of your heart and how well your heart's chambers and valves are working. This procedure takes approximately one hour. There are no restrictions for this procedure.  Follow-Up: 3 MONTHS WITH DR. Acie [name] AFTER ECHO HAS BEEN  COMPLETED  Any Other Special Instructions Will Be Listed Below (If Applicable). PLEASE  SEE ENT FOR NOSEBLEEDS.   IF HEADACHES CONTINUE AFTER SEEING ENT OR IF YOUR HUSBANDS SEES YOU STOP BREATHING  WHEN YOU SLEEP PLEASE CALL THE OFFICE TO ARRANGE A SLEEP STUDY  If you need a refill on your cardiac medications before your next appointment, please call your pharmacy.  Signed, Richardson Dopp, PA-C  09/27/2016 10:58 AM    [facility] [address], [place], [place]  [address] Phone: [phone]; Fax: [phone]

## 2016-09-27 NOTE — Patient Instructions (Addendum)
Medication Instructions:  Your physician recommends that you continue on your current medications as directed. Please refer to the Current Medication list given to you today.  Labwork: 1. IN 1 MONTH YOU WILL NEED A BMET, CBC DUE TO NEW START OF ELIQUIS  Testing/Procedures: 1. Your physician has requested that you have an echocardiogram. THIS IS TO BE DONE IN 3 MONTHS; YOU WILL NEED AN APPT ABOUT 1 WEEK LATER WITH DR. Acie Fredrickson. Echocardiography is a painless test that uses sound waves to create images of your heart. It provides your doctor with information about the size and shape of your heart and how well your heart's chambers and valves are working. This procedure takes approximately one hour. There are no restrictions for this procedure.  Follow-Up: 3 MONTHS WITH DR. Acie Fredrickson AFTER ECHO HAS BEEN COMPLETED  Any Other Special Instructions Will Be Listed Below (If Applicable). PLEASE  SEE ENT FOR NOSEBLEEDS.   IF HEADACHES CONTINUE AFTER SEEING ENT OR IF YOUR HUSBANDS SEES YOU STOP BREATHING WHEN YOU SLEEP PLEASE CALL THE OFFICE TO ARRANGE A SLEEP STUDY  If you need a refill on your cardiac medications before your next appointment, please call your pharmacy.

## 2016-11-01 ENCOUNTER — Other Ambulatory Visit: Payer: Medicare Other | Admitting: *Deleted

## 2016-11-01 ENCOUNTER — Other Ambulatory Visit: Payer: Self-pay | Admitting: Physician Assistant

## 2016-11-01 DIAGNOSIS — I119 Hypertensive heart disease without heart failure: Secondary | ICD-10-CM

## 2016-11-01 DIAGNOSIS — I4819 Other persistent atrial fibrillation: Secondary | ICD-10-CM

## 2016-11-01 NOTE — Progress Notes (Signed)
[id number]  

## 2016-11-01 NOTE — Addendum Note (Signed)
Addended by: Eulis Foster on: 11/01/2016 03:03 PM   Modules accepted: Orders

## 2016-11-02 ENCOUNTER — Telehealth: Payer: Self-pay | Admitting: *Deleted

## 2016-11-02 DIAGNOSIS — I119 Hypertensive heart disease without heart failure: Secondary | ICD-10-CM

## 2016-11-02 LAB — CBC WITH DIFFERENTIAL/PLATELET
BASOS ABS: 0 10*3/uL (ref 0.0–0.2)
Basos: 0 %
EOS (ABSOLUTE): 0.3 10*3/uL (ref 0.0–0.4)
Eos: 3 %
HEMOGLOBIN: 12.8 g/dL (ref 11.1–15.9)
Hematocrit: 39.2 % (ref 34.0–46.6)
IMMATURE GRANS (ABS): 0 10*3/uL (ref 0.0–0.1)
IMMATURE GRANULOCYTES: 0 %
LYMPHS: 32 %
Lymphocytes Absolute: 2.8 10*3/uL (ref 0.7–3.1)
MCH: 31 pg (ref 26.6–33.0)
MCHC: 32.7 g/dL (ref 31.5–35.7)
MCV: 95 fL (ref 79–97)
MONOCYTES: 9 %
Monocytes Absolute: 0.8 10*3/uL (ref 0.1–0.9)
NEUTROS PCT: 56 %
Neutrophils Absolute: 4.9 10*3/uL (ref 1.4–7.0)
PLATELETS: 256 10*3/uL (ref 150–379)
RBC: 4.13 x10E6/uL (ref 3.77–5.28)
RDW: 15.6 % — AB (ref 12.3–15.4)
WBC: 8.7 10*3/uL (ref 3.4–10.8)

## 2016-11-02 LAB — BASIC METABOLIC PANEL
BUN/Creatinine Ratio: 30 — ABNORMAL HIGH (ref 12–28)
BUN: 24 mg/dL (ref 8–27)
CALCIUM: 9.1 mg/dL (ref 8.7–10.3)
CHLORIDE: 103 mmol/L (ref 96–106)
CO2: 26 mmol/L (ref 18–29)
Creatinine, Ser: 0.79 mg/dL (ref 0.57–1.00)
GFR, EST AFRICAN AMERICAN: 79 mL/min/{1.73_m2} (ref >59)
GFR, EST NON AFRICAN AMERICAN: 68 mL/min/{1.73_m2} (ref >59)
Glucose: 78 mg/dL (ref 65–99)
POTASSIUM: 5.4 mmol/L — AB (ref 3.5–5.2)
Sodium: 142 mmol/L (ref 134–144)

## 2016-11-02 NOTE — Telephone Encounter (Signed)
Pt notified of lab results and findings by phone with verbal understanding. Pt denies taking any K+ supplements. I asked pt to limit the amount of K+ rich foods such as things like bananas, beans, potatoes, orange juice or oranges. Pt is agreeable to repeat BMET to be done in 1 week at [facility] in [place]. I will mail Rx to pt for the lab work with the results to be faxed to [facility]. I verified pt's address. Pt thanked me for my call today.

## 2016-11-02 NOTE — Telephone Encounter (Signed)
-----   Message from Liliane Shi, [place] sent at 11/02/2016  2:48 PM EDT ----- Please call the patient Kidney function is normal. The potassium is a little high. If she is on any potassium supplement, stop taking. Reduce dietary potassium. Repeat BMET 1 week (ok to do with PCP in [place] if easier). Richardson Dopp, PA-C    11/02/2016 2:48 PM

## 2016-11-09 ENCOUNTER — Telehealth: Payer: Self-pay | Admitting: *Deleted

## 2016-11-09 ENCOUNTER — Telehealth: Payer: Self-pay | Admitting: Cardiovascular Disease

## 2016-11-09 LAB — BASIC METABOLIC PANEL
BUN / CREAT RATIO: 23 (ref 12–28)
BUN: 16 mg/dL (ref 8–27)
CHLORIDE: 102 mmol/L (ref 96–106)
CO2: 25 mmol/L (ref 18–29)
CREATININE: 0.69 mg/dL (ref 0.57–1.00)
Calcium: 9 mg/dL (ref 8.7–10.3)
GFR calc Af Amer: 91 mL/min/{1.73_m2} (ref >59)
GFR calc non Af Amer: 79 mL/min/{1.73_m2} (ref >59)
GLUCOSE: 98 mg/dL (ref 65–99)
POTASSIUM: 4.8 mmol/L (ref 3.5–5.2)
SODIUM: 141 mmol/L (ref 134–144)

## 2016-11-09 NOTE — Telephone Encounter (Signed)
-----   Message from Liliane Shi, [place] sent at 11/09/2016 12:24 PM EDT ----- Please call the patient Kidney function is normal; potassium is normal. Continue with current treatment plan. Richardson Dopp, PA-C   11/09/2016 12:24 PM

## 2016-11-09 NOTE — Telephone Encounter (Signed)
New message   Altha Harm is calling from [facility] to find out if pt has a diagnosis a heart failure or congestive heart failure. She was referred to the [facility].

## 2016-11-09 NOTE — Telephone Encounter (Signed)
Pt has been notified of lab results by phone with verbal understanding. Pt aware to continue on current Tx plan. Pt thanked me for my call today.

## 2016-11-10 NOTE — Telephone Encounter (Signed)
Left detailed message on confidential voice mail of Altha Harm, RN with [facility] with patient's dx of HOCM and last EF of 60-65% from February 2018.

## 2016-12-03 ENCOUNTER — Other Ambulatory Visit: Payer: Self-pay | Admitting: Cardiovascular Disease

## 2016-12-15 ENCOUNTER — Encounter: Payer: Self-pay | Admitting: Cardiovascular Disease

## 2016-12-27 ENCOUNTER — Other Ambulatory Visit: Payer: Self-pay

## 2016-12-27 ENCOUNTER — Ambulatory Visit (HOSPITAL_COMMUNITY): Payer: Medicare Other | Attending: Cardiology

## 2016-12-27 DIAGNOSIS — I083 Combined rheumatic disorders of mitral, aortic and tricuspid valves: Secondary | ICD-10-CM | POA: Insufficient documentation

## 2016-12-27 DIAGNOSIS — I340 Nonrheumatic mitral (valve) insufficiency: Secondary | ICD-10-CM | POA: Diagnosis not present

## 2016-12-27 DIAGNOSIS — I4819 Other persistent atrial fibrillation: Secondary | ICD-10-CM

## 2016-12-27 DIAGNOSIS — I119 Hypertensive heart disease without heart failure: Secondary | ICD-10-CM | POA: Insufficient documentation

## 2016-12-27 DIAGNOSIS — I421 Obstructive hypertrophic cardiomyopathy: Secondary | ICD-10-CM

## 2016-12-27 DIAGNOSIS — I481 Persistent atrial fibrillation: Secondary | ICD-10-CM | POA: Diagnosis not present

## 2016-12-28 ENCOUNTER — Encounter: Payer: Self-pay | Admitting: Physician Assistant

## 2016-12-28 ENCOUNTER — Telehealth: Payer: Self-pay | Admitting: *Deleted

## 2016-12-28 NOTE — Telephone Encounter (Signed)
Lmtcb to go over echo results.  

## 2016-12-28 NOTE — Telephone Encounter (Signed)
-----   Message from Liliane Shi, [place] sent at 12/28/2016  5:02 PM EDT ----- Please call the patient. The echocardiogram shows normal ejection fraction and moderate diastolic dysfunction. The gradients in 08/2016 were elevated, which is why we repeated the echocardiogram now.  Luckily, the gradients on this echocardiogram are lower than in 08/2016. Continue current management and planned follow up.   Please fax a copy of this study result to her PCP:  [name], MD  Thanks! Richardson Dopp, PA-C    12/28/2016 4:56 PM

## 2016-12-29 NOTE — Telephone Encounter (Signed)
-----   Message from Liliane Shi, [place] sent at 12/28/2016  5:02 PM EDT ----- Please call the patient. The echocardiogram shows normal ejection fraction and moderate diastolic dysfunction. The gradients in 08/2016 were elevated, which is why we repeated the echocardiogram now.  Luckily, the gradients on this echocardiogram are lower than in 08/2016. Continue current management and planned follow up.   Please fax a copy of this study result to her PCP:  [name], MD  Thanks! Richardson Dopp, PA-C    12/28/2016 4:56 PM

## 2016-12-29 NOTE — Telephone Encounter (Signed)
Lmtcb x 2 to go over echo results.  

## 2017-01-03 ENCOUNTER — Encounter: Payer: Self-pay | Admitting: Cardiovascular Disease

## 2017-01-03 ENCOUNTER — Encounter (INDEPENDENT_AMBULATORY_CARE_PROVIDER_SITE_OTHER): Payer: Self-pay

## 2017-01-03 ENCOUNTER — Telehealth: Payer: Self-pay | Admitting: *Deleted

## 2017-01-03 ENCOUNTER — Ambulatory Visit (INDEPENDENT_AMBULATORY_CARE_PROVIDER_SITE_OTHER): Payer: Medicare Other | Admitting: Cardiovascular Disease

## 2017-01-03 VITALS — BP 160/80 | HR 59 | Ht 62.0 in | Wt 149.8 lb

## 2017-01-03 DIAGNOSIS — I480 Paroxysmal atrial fibrillation: Secondary | ICD-10-CM | POA: Diagnosis not present

## 2017-01-03 DIAGNOSIS — I10 Essential (primary) hypertension: Secondary | ICD-10-CM

## 2017-01-03 DIAGNOSIS — I4819 Other persistent atrial fibrillation: Secondary | ICD-10-CM

## 2017-01-03 DIAGNOSIS — I421 Obstructive hypertrophic cardiomyopathy: Secondary | ICD-10-CM | POA: Diagnosis not present

## 2017-01-03 DIAGNOSIS — I481 Persistent atrial fibrillation: Secondary | ICD-10-CM | POA: Diagnosis not present

## 2017-01-03 NOTE — Telephone Encounter (Signed)
Pt has been notified of echo results and findings by phone with verbal understanding. I will fax a copy of results to PCP Dr. Joylene Draft. Pt thanked me for my call today. Pt is aware to continue with current Tx plan.

## 2017-01-03 NOTE — Progress Notes (Signed)
Stacy [name] Date of Birth  28-Apr-1929 [place] [address]    [place]   [place], [place]  [address]    [place], [place]  [address] [phone]  Fax  [phone]  [phone]  Fax [phone]  Problems: 1. 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 Hyperlipidemia 3. Mitral regurgitation 4. Paroxysmal atrial fib (diagnosed Feb. 2018 )  CHads2VASc 5 ( female  age 83, HTN, diastolic CHF)    History of Present Illness:  Stacy [name] is done very well from a cardiac standpoint. She's not had any episodes of chest pain or shortness of breath. She's had a cough and bronchitis for the past several weeks. She denies any syncope or presyncope. She denies any chest pain.  Her blood pressure has been well-controlled on. She's not had any problems.   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  April 10 , 2014:  No chest pain.  No dyspnea.  BP has been ok.  She gets some exercise - not as much as she should - also has orthopedic issues that limit her.   Sept. 15, 2014:  She has a mild dynamic  LVOT obstruction.   We increased her metoprolol at the last visit and she is doing well.  No dizziness.  She does have some vertigo and takes meclizine.  Sept. 17, 2015: Doing well.  No CP or dyspnea.   Exercising regularly.  Sept. 23, 2016:  Doing well from a cardiac standpoint Had an episode of vertigo a week ago .  Lasted for several minutes.  Did not go to the ER.  Symptoms have resolved completely   October 14, 2015: Stacy [name] has a dynamic LVOT obstruction that we are managing with metoprolol.   Her HR has been a bit slow She is off the  HCTZ .   i've advised her to avoid being volume depleted.  Has had some dizziness and headaches recently  Taking metoprolol 50 mg in the am , 25 mg in the evening   January 03, 2017:  Stacy [name] is seen today for follow up visit She was found to  have Atrial fib with RVR ( FEb. 21-23 hospitalization) and has been started on Eliquis  She has converted back to NSR / sinus bradycardia  Echo shows normal LV systolic function, grade 2 diastolic dysfunction, mild AS   Has not been taking her Eliquis - having occasional bloody nose. Has some bleeding under her right great toe.  BP is typically better at home    Current Outpatient Prescriptions on File Prior to Visit  Medication Sig Dispense Refill  . apixaban (ELIQUIS) 5 MG TABS tablet Take 1 tablet (5 mg total) by mouth 2 (two) times daily. 60 tablet 0  . aspirin 81 MG tablet Take 81 mg by mouth daily.    Marland Kitchen atorvastatin (LIPITOR) 10 MG tablet Take 1 tablet (10 mg total) by mouth daily. 30 tablet 5  . diltiazem (CARDIZEM CD) 180 MG 24 hr capsule Take 1 capsule (180 mg total) by mouth daily. 30 capsule 0  . escitalopram (LEXAPRO) 5 MG tablet Take 5 mg by mouth every morning.    . folic acid (FOLVITE) 1 MG tablet Take 3 mg by mouth daily.     . meclizine (ANTIVERT) 25 MG tablet Take 25 mg by mouth daily as needed. As needed for dizziness    .  methotrexate (RHEUMATREX) 2.5 MG tablet Take 15 mg by mouth every Saturday. Caution:Chemotherapy. Protect from light.     . metoprolol tartrate (LOPRESSOR) 25 MG tablet TAKE ONE (1) TABLET BY MOUTH TWO (2) TIMES DAILY 180 tablet 3  . omeprazole (PRILOSEC) 20 MG capsule Take 20 mg by mouth daily.     Marland Kitchen rOPINIRole (REQUIP) 1 MG tablet Take 1 mg by mouth at bedtime.     . traMADol (ULTRAM) 50 MG tablet Take 50 mg by mouth daily as needed. As needed for moderate pain    . valsartan (DIOVAN) 160 MG tablet Take 160 mg by mouth daily.     No current facility-administered medications on file prior to visit.     Allergies  Allergen Reactions  . Sulfa Antibiotics Nausea Only    Past Medical History:  Diagnosis Date  . Bowel obstruction ([place])   . Diastolic dysfunction   . Gallbladder disease   . GERD (gastroesophageal reflux disease)   . HOCM  (hypertrophic obstructive cardiomyopathy) ([place]) 09/27/2016   Echo 10/16: severe LVH, EF 55-60, peak 32, no RWMA, +SAM, mild to mod MR, severe LAE, PASP 28 // Echo 2/18: mod conc LVH, severe septal LVH c/w HCM, EF 60-65, peak LVOT 94, no RWMA, + SAM, severe MR, severe LAE, PASP 25 // Echo 6/18: severe conc LVH, EF 60-65, noRWMA, Gr 2 DD, peak LVOT gradient 20, mild aortic stenosis, mild AI, mild MR, severe LAE, mild TR, PASP 33   . Hypertension   . LVH (left ventricular hypertrophy)    WITH ASYMMETRIC SEPTAL HYPERTROPHY  . Mitral regurgitation 09/09/2016   Severe by echo 08/2016  . Persistent atrial fibrillation ([place]) 09/09/2016  . Rheumatoid arthritis [facility])     Past Surgical History:  Procedure Laterality Date  . CARDIOVASCULAR STRESS TEST  03/10/2006   EF 69%. NO EVIDENCE OF ISCHEMIA  . CHOLECYSTECTOMY    . TOTAL HIP ARTHROPLASTY    . US ECHOCARDIOGRAPHY  02/21/2008   EF 55-60%  . US ECHOCARDIOGRAPHY  07/21/2005   EF 55-60%    History  Smoking Status  . Never Smoker  Smokeless Tobacco  . Never Used    History  Alcohol Use No    Family History  Problem Relation Age of Onset  . Leukemia Father   . Heart attack Brother   . Hypertension Mother   . Stroke Mother     Reviw of Systems:  Reviewed in the HPI.  All other systems are negative.  Physical Exam: Blood pressure (!) 160/80, pulse (!) 59, height 5\' 2"  (1.575 m), weight 149 lb 12.8 oz (67.9 kg). General: Well developed, well nourished, in no acute distress.  Head: Normocephalic, atraumatic, sclera non-icteric, mucus membranes are moist,   Neck: Supple. Negative for carotid bruits. JVD not elevated.  Lungs: Clear bilaterally to auscultation without wheezes, rales, or rhonchi. Breathing is unlabored.  Heart: RRR with S1 S2. There is a 3/6  Crescendo decrescendo systolic murmur.  Abdomen: Soft, non-tender, non-distended with normoactive bowel sounds. No hepatomegaly. No rebound/guarding. No obvious abdominal  masses.  Msk:  Strength and tone appear normal for age.  Extremities: No clubbing or cyanosis. No edema.  Distal pedal pulses are 2+ and equal bilaterally.  Neuro: Alert and oriented X 3. Moves all extremities spontaneously.  Psych:  Responds to questions appropriately with a normal affect.  ECG: January 03, 2017:   Sinus brady at 30.   1st degree AV block   Assessment / Plan:   1. Paroxysmal  atrial fib : CHADS2VASC score 5  ( female, age , HTN, CHF)  She has converted back to normal sinus rhythm. She's having some minor bleeding issues-occasional bloody nose and some bleeding under her right great toe. She's not had any life-threatening bleeding problems. I recalculated her dose for Eliquis - it is correct.  Advised her to let me know if she has increased bleeding issues  1. Left ventricular hypertrophy with mild LVOT obstruction-   Continue metoprolol - slightly lower dose   2. Hyperlipidemia  - followed by Dr. Joylene Draft  3. Mitral regurgitation-   Will re-evaluate by echo .  I will see her again in 6 months    [name], MD  01/03/2017 10:38 AM    [place],  [place], [place]  [address] Pager [phone] Phone: [phone]; Fax: [phone]

## 2017-01-03 NOTE — Patient Instructions (Signed)

## 2017-01-03 NOTE — Telephone Encounter (Signed)
-----   Message from Liliane Shi, [place] sent at 12/28/2016  5:02 PM EDT ----- Please call the patient. The echocardiogram shows normal ejection fraction and moderate diastolic dysfunction. The gradients in 08/2016 were elevated, which is why we repeated the echocardiogram now.  Luckily, the gradients on this echocardiogram are lower than in 08/2016. Continue current management and planned follow up.   Please fax a copy of this study result to her PCP:  [name], MD  Thanks! Richardson Dopp, PA-C    12/28/2016 4:56 PM

## 2017-01-10 ENCOUNTER — Encounter: Payer: Self-pay | Admitting: Vascular Surgery

## 2017-01-11 ENCOUNTER — Encounter: Payer: Self-pay | Admitting: Vascular Surgery

## 2017-01-11 ENCOUNTER — Ambulatory Visit (INDEPENDENT_AMBULATORY_CARE_PROVIDER_SITE_OTHER): Payer: Medicare Other | Admitting: Vascular Surgery

## 2017-01-11 VITALS — BP 131/64 | HR 61 | Temp 97.8°F | Resp 16 | Ht 62.0 in | Wt 142.0 lb

## 2017-01-11 DIAGNOSIS — I8393 Asymptomatic varicose veins of bilateral lower extremities: Secondary | ICD-10-CM | POA: Diagnosis not present

## 2017-01-11 NOTE — Progress Notes (Signed)
Vitals:   01/11/17 1334  BP: (!) 154/74  Pulse: 61  Resp: 16  Temp: 97.8 F (36.6 C)  TempSrc: Oral  SpO2: 96%  Weight: 142 lb (64.4 kg)  Height: 5\' 2"  (1.575 m)

## 2017-01-11 NOTE — Progress Notes (Signed)
Subjective:     Patient ID: Stacy [name], female   DOB: 02-03-1929, 83 y.o.   MRN: [id number]  HPI This 83 year old female was referred by Dr. Joylene Draft for evaluation of bilateral varicose veins. The patient has noted "spider veins" in both legs for the past many years and these have become more extensive. She denies any swelling in the lower extremities. She has no history of DVT thrombophlebitis stasis ulcers or bleeding. If she sits for long period of time she develops a aching discomfort in the lateral aspect of her right leg which then radiates up into the hip area. She has no history of lumbar spine disease. She has had 3 previous hip replacements on the contralateral left side. Her symptoms are then relieved by ambulation.  Past Medical History:  Diagnosis Date  . Bowel obstruction ([place])   . Diastolic dysfunction   . Gallbladder disease   . GERD (gastroesophageal reflux disease)   . HOCM (hypertrophic obstructive cardiomyopathy) ([place]) 09/27/2016   Echo 10/16: severe LVH, EF 55-60, peak 32, no RWMA, +SAM, mild to mod MR, severe LAE, PASP 28 // Echo 2/18: mod conc LVH, severe septal LVH c/w HCM, EF 60-65, peak LVOT 94, no RWMA, + SAM, severe MR, severe LAE, PASP 25 // Echo 6/18: severe conc LVH, EF 60-65, noRWMA, Gr 2 DD, peak LVOT gradient 20, mild aortic stenosis, mild AI, mild MR, severe LAE, mild TR, PASP 33   . Hypertension   . LVH (left ventricular hypertrophy)    WITH ASYMMETRIC SEPTAL HYPERTROPHY  . Mitral regurgitation 09/09/2016   Severe by echo 08/2016  . Persistent atrial fibrillation ([place]) 09/09/2016  . Rheumatoid arthritis [facility])     Social History  Substance Use Topics  . Smoking status: Never Smoker  . Smokeless tobacco: Never Used  . Alcohol use No    Family History  Problem Relation Age of Onset  . Leukemia Father   . Heart attack Brother   . Hypertension Mother   . Stroke Mother     Allergies  Allergen Reactions  . Sulfa Antibiotics Nausea Only      Current Outpatient Prescriptions:  .  apixaban (ELIQUIS) 5 MG TABS tablet, Take 1 tablet (5 mg total) by mouth 2 (two) times daily., Disp: 60 tablet, Rfl: 0 .  atorvastatin (LIPITOR) 10 MG tablet, Take 1 tablet (10 mg total) by mouth daily., Disp: 30 tablet, Rfl: 5 .  diltiazem (CARDIZEM CD) 180 MG 24 hr capsule, Take 1 capsule (180 mg total) by mouth daily., Disp: 30 capsule, Rfl: 0 .  escitalopram (LEXAPRO) 5 MG tablet, Take 5 mg by mouth every morning., Disp: , Rfl:  .  folic acid (FOLVITE) 1 MG tablet, Take 3 mg by mouth daily. , Disp: , Rfl:  .  meclizine (ANTIVERT) 25 MG tablet, Take 25 mg by mouth daily as needed. As needed for dizziness, Disp: , Rfl:  .  methotrexate (RHEUMATREX) 2.5 MG tablet, Take 15 mg by mouth every Saturday. Caution:Chemotherapy. Protect from light. , Disp: , Rfl:  .  metoprolol tartrate (LOPRESSOR) 25 MG tablet, TAKE ONE (1) TABLET BY MOUTH TWO (2) TIMES DAILY, Disp: 180 tablet, Rfl: 3 .  omeprazole (PRILOSEC) 20 MG capsule, Take 20 mg by mouth daily. , Disp: , Rfl:  .  rOPINIRole (REQUIP) 1 MG tablet, Take 1 mg by mouth at bedtime. , Disp: , Rfl:  .  traMADol (ULTRAM) 50 MG tablet, Take 50 mg by mouth daily as needed. As needed for moderate  pain, Disp: , Rfl:  .  valsartan (DIOVAN) 160 MG tablet, Take 160 mg by mouth daily., Disp: , Rfl:  .  aspirin 81 MG tablet, Take 81 mg by mouth daily., Disp: , Rfl:   Vitals:   01/11/17 1334 01/11/17 1338  BP: (!) 154/74 131/64  Pulse: 61 61  Resp: 16   Temp: 97.8 F (36.6 C)   TempSrc: Oral   SpO2: 96%   Weight: 142 lb (64.4 kg)   Height: 5\' 2"  (1.575 m)     Body mass index is 25.97 kg/m.         Review of Systems Has history of cardiomyopathy, previous multiple hip replacements on the left, atrial fibrillation, rheumatoid arthritis. Denies chest pain, dyspnea on exertion, PND, orthopnea.    Objective:   Physical Exam BP 131/64 (BP Location: Right Arm, Patient Position: Sitting, Cuff Size: Normal)    Pulse 61   Temp 97.8 F (36.6 C) (Oral)   Resp 16   Ht 5\' 2"  (1.575 m)   Wt 142 lb (64.4 kg)   LMP  (LMP Unknown)   SpO2 96%   BMI 25.97 kg/m     Gen.-alert and oriented x3 in no apparent distress HEENT normal for age Lungs no rhonchi or wheezing Cardiovascular regular rhythm no murmurs carotid pulses 3+ palpable no bruits audible Abdomen soft nontender no palpable masses Musculoskeletal free of  major deformities Skin clear -no rashes Neurologic normal Lower extremities 3+ femoral and dorsalis pedis pulses palpable bilaterally with no edema Diffuse bilateral spider veins in the medial and lateral thighs and medial and lateral calf areas. No edema distally. No hyperpigmentation or ulceration noted. Both feet well perfused. No bulging varicosities noted.  Today performed a bedside SonoSite ultrasound exam which revealed normal size great saphenous vein on the right with no evidence of reflux       Assessment:     #1 bilateral diffuse spider veins with no evidence of gross reflux right great saphenous vein #2 right leg pain-etiology unknown-possible nerve compression syndrome versus right hip pathology #3 atrial fibrillation-on chronic anticoagulation #4 cardiomyopathy    Plan:     Do not recommend any intervention or treatment for her venous disease which consists of spider veins. This should not be causing the symptoms which she complains of in the right leg For symptoms continued to be significant she may need evaluation for possible lumbar spine disease versus right hip pathology but would not recommend any venous intervention

## 2017-04-18 ENCOUNTER — Ambulatory Visit (INDEPENDENT_AMBULATORY_CARE_PROVIDER_SITE_OTHER): Payer: Medicare Other | Admitting: Physician Assistant

## 2017-04-18 ENCOUNTER — Encounter: Payer: Self-pay | Admitting: Physician Assistant

## 2017-04-18 VITALS — BP 140/60 | HR 69 | Ht 62.0 in | Wt 155.4 lb

## 2017-04-18 DIAGNOSIS — I480 Paroxysmal atrial fibrillation: Secondary | ICD-10-CM | POA: Diagnosis not present

## 2017-04-18 DIAGNOSIS — I5033 Acute on chronic diastolic (congestive) heart failure: Secondary | ICD-10-CM | POA: Diagnosis not present

## 2017-04-18 DIAGNOSIS — I421 Obstructive hypertrophic cardiomyopathy: Secondary | ICD-10-CM

## 2017-04-18 NOTE — Patient Instructions (Addendum)
Medication Instructions:  1. INCREASE LASIX TO 20 MG TWICE DAILY FOR 3 DAYS; AFTER THE 3 DAYS RESUME LASIX 20 MG DAILY;   PER SCOTT WEAVER, PAC IF AFTER TAKING THE LASIX FOR 1 WEEK AND YOUR WEIGHT IS BACK TO BASELINE THEN YOU WILL ONLY NEED TO TAKE LASIX AS NEEDED FOR WEIGHT GAIN, INCREASED SHORTNESS OF BREATH OR SWELLING.  2. INCREASE POTASSIUM TO 10 MEQ TWICE DAILY FOR 3 DAYS; AFTER THE DAYS RESUME POTASSIUM 10 MEQ DAILY;   PER SCOTT WEAVER, PAC IF AFTER TAKING THE POTASSIUM FOR 1 WEEK AND YOUR WEIGHT IS BACK TO BASELINE THEN YOU WILL ONLY NEED TO TAKE POTASSIUM AS NEEDED ONLY IF YOU TAKE THE LASIX FOR WEIGHT GAIN, INCREASED SHORTNESS OF BREATH OR SWELLING.   Labwork: BMET TO BE DONE IN 1 WEEK IN OUR OFFICE ONLY IF DR. PERINI DOES NOT ORDER LAB WORK WHEN YOU SEE HIM Friday 04/22/17  Testing/Procedures: NONE ORDERED TODAY  Follow-Up: 05/02/17 @ 11:45 WITH SCOTT WEAVER, PAC   Any Other Special Instructions Will Be Listed Below (If Applicable).  MONITOR YOUR WEIGHT AND IF YOUR WEIGHT IS UP 3 LB'S IN 1 DAY OR 5 LB'S IN 1 WEEK PLEASE CALL THE OFFICE FOR RECOMMENDATIONS  [phone]   If you need a refill on your cardiac medications before your next appointment, please call your pharmacy.

## 2017-04-18 NOTE — Progress Notes (Signed)
Cardiology Office Note:    Date:  04/18/2017   ID:  Stacy [name], DOB [birth year], MRN [id number]  PCP:  [name], MD  Cardiologist:  Dr. Liam Rogers    Referring MD: [name], MD   Chief Complaint  Patient presents with  . Congestive Heart Failure    History of Present Illness:    Stacy [name] is a 83 y.o. female with a hx of atrial fib, HOCM (LVH with mild LVOT obstruction), mitral regurgitation, HL, RA. Last seen by Dr. Acie Fredrickson 3/17.  Admitted 08/2016 with AF with RVR and elevated Troponin levels.  CHADS2-VASc=4 (HTN, female, 83 y.o.).   She was placed on IV diltiazem for rate control and apixaban for anticoagulation. She was noted to be back in NSR at follow up in 09/2016.  Follow-up echocardiogram in June 2018 demonstrated normal LV function with severe LVH and moderate diastolic dysfunction. No significant LVOT gradient was noted.    Last seen in clinic with Dr. Liam Rogers 01/03/17.  She had been holding her Eliquis b/c of nose bleeds.  She was to resume Eliquis 5 mg Twice daily.  [name] returns for evaluation of edema and shortness of breath.  She was seen by her PCP this AM who referred her back for evaluation today.  She was on vacation in [place] last week.  She denies eating a diet high in salt while she was gone.  She notes a non-productive cough and worsening shortness of breath over her vacation.  She lifted a suitcase a few days ago that caused a snapping sensation.  She has not had any further pain or discomfort.  She notes orthopnea but no paroxysmal nocturnal dyspnea. She notes LE edema that is unusual for her. Her weight is up 13 lbs by our scales since June 2018.  She denies syncope, fever, bleeding issues.   Prior CV studies:   The following studies were reviewed today:  Echo 12/27/16 Severe concentric LVH, EF 60-65, normal wall motion, grade 2 diastolic dysfunction, LVOT peak gradient 20 mmHg, septal thickness 18.3 mm, mild aortic stenosis, mild AI,  MAC, mild MR, severe LAE, normal RVSF, mild TR, PASP 33  Echo 09/09/16 Moderate concentric LVH with severe septal hypertrophy-consistent with HCM, EF 60-65, dynamic obstruction with peak gradient 94, normal wall motion, septal thickness 17.34 mm, MAC with + SAM, severe MR, severe LAE, normal RVSF, mild TR, PASP 25  Echo 10/16 Severe LVH, severe focal basal septal hypertrophy with moderate posterior wall hypertrophy, EF 55-60, peak gradient 32, normal wall motion, MAC, + SAM, mild to moderate MR, severe LAE, normal RVSF, trivial TR/P, PASP 28  Nuclear stress test 8/07 EF 69, apical attenuation consistent with breast artifact, no ischemia  Past Medical History:  Diagnosis Date  . Bowel obstruction ([place])   . Diastolic dysfunction   . Gallbladder disease   . GERD (gastroesophageal reflux disease)   . HOCM (hypertrophic obstructive cardiomyopathy) ([place]) 09/27/2016   Echo 10/16: severe LVH, EF 55-60, peak 32, no RWMA, +SAM, mild to mod MR, severe LAE, PASP 28 // Echo 2/18: mod conc LVH, severe septal LVH c/w HCM, EF 60-65, peak LVOT 94, no RWMA, + SAM, severe MR, severe LAE, PASP 25 // Echo 6/18: severe conc LVH, EF 60-65, noRWMA, Gr 2 DD, peak LVOT gradient 20, mild aortic stenosis, mild AI, mild MR, severe LAE, mild TR, PASP 33   . Hypertension   . LVH (left ventricular hypertrophy)    WITH ASYMMETRIC SEPTAL HYPERTROPHY  .  Mitral regurgitation 09/09/2016   Severe by echo 08/2016  . Persistent atrial fibrillation ([place]) 09/09/2016  . Rheumatoid arthritis [facility])     Past Surgical History:  Procedure Laterality Date  . CARDIOVASCULAR STRESS TEST  03/10/2006   EF 69%. NO EVIDENCE OF ISCHEMIA  . CHOLECYSTECTOMY    . TOTAL HIP ARTHROPLASTY    . US ECHOCARDIOGRAPHY  02/21/2008   EF 55-60%  . US ECHOCARDIOGRAPHY  07/21/2005   EF 55-60%    Current Medications: Current Meds  Medication Sig  . apixaban (ELIQUIS) 5 MG TABS tablet Take 1 tablet (5 mg total) by mouth 2 (two) times daily.  Marland Kitchen  atorvastatin (LIPITOR) 10 MG tablet Take 1 tablet (10 mg total) by mouth daily.  Marland Kitchen diltiazem (CARDIZEM CD) 180 MG 24 hr capsule Take 1 capsule (180 mg total) by mouth daily.  Marland Kitchen escitalopram (LEXAPRO) 5 MG tablet Take 5 mg by mouth every morning.  . folic acid (FOLVITE) 1 MG tablet Take 3 mg by mouth daily.   . furosemide (LASIX) 20 MG tablet Take 20 mg by mouth daily.   . irbesartan (AVAPRO) 150 MG tablet Take 150 mg by mouth daily.   . meclizine (ANTIVERT) 25 MG tablet Take 25 mg by mouth daily as needed. As needed for dizziness  . methotrexate (RHEUMATREX) 2.5 MG tablet Take 15 mg by mouth every Saturday. Caution:Chemotherapy. Protect from light.   . metoprolol tartrate (LOPRESSOR) 25 MG tablet TAKE 2 TABLETS BY MOUTH IN THE MORNING AND 1 TABLET  BY MOUTH AT BEDTIME  . omeprazole (PRILOSEC) 20 MG capsule Take 20 mg by mouth daily.   . potassium chloride (K-DUR) 10 MEQ tablet Take 10 mEq by mouth daily.   . prednisoLONE acetate (PRED FORTE) 1 % ophthalmic suspension Place 1 drop into both eyes as needed.   Marland Kitchen rOPINIRole (REQUIP) 1 MG tablet Take 1 mg by mouth at bedtime.   . traMADol (ULTRAM) 50 MG tablet Take 50 mg by mouth daily as needed. As needed for moderate pain  . triamcinolone cream (KENALOG) 0.1 % Apply 1 application topically 2 (two) times daily.      Allergies:   Sulfa antibiotics   Social History   Social History  . Marital status: Married    Spouse name: N/A  . Number of children: N/A  . Years of education: N/A   Social History Main Topics  . Smoking status: Never Smoker  . Smokeless tobacco: Never Used  . Alcohol use No  . Drug use: No  . Sexual activity: Not Asked   Other Topics Concern  . None   Social History Narrative  . None     Family Hx: The patient's family history includes Heart attack in her brother; Hypertension in her mother; Leukemia in her father; Stroke in her mother.  ROS:   Please see the history of present illness.    Review of Systems    Constitution: Positive for decreased appetite and weight gain.  Cardiovascular: Positive for chest pain, irregular heartbeat and leg swelling.  Respiratory: Positive for cough, shortness of breath, snoring and wheezing.   Musculoskeletal: Positive for joint pain.  Neurological: Positive for headaches.   All other systems reviewed and are negative.   EKGs/Labs/Other Test Reviewed:    EKG:  EKG is  ordered today.  The ekg ordered today demonstrates NSR, HR 69, normal axis, QTc 454 ms, no changes.   Recent Labs: 09/09/2016: B Natriuretic Peptide 379.5 09/10/2016: ALT 16; Magnesium 2.1; TSH 1.531 11/01/2016:  Hemoglobin 12.8; Platelets 256 11/08/2016: BUN 16; Creatinine, Ser 0.69; Potassium 4.8; Sodium 141   Recent Lipid Panel Lab Results  Component Value Date/Time   CHOL 185 09/10/2016 12:12 AM   TRIG 136 09/10/2016 12:12 AM   HDL 43 09/10/2016 12:12 AM   CHOLHDL 4.3 09/10/2016 12:12 AM   LDLCALC 115 (H) 09/10/2016 12:12 AM    Physical Exam:    VS:  BP 140/60   Pulse 69   Ht _0  (1.575 m)   Wt 155 lb 6.4 oz (70.5 kg)   LMP  (LMP Unknown)   SpO2 97%   BMI 28.42 kg/m     Wt Readings from Last 3 Encounters:  04/18/17 155 lb 6.4 oz (70.5 kg)  01/11/17 142 lb (64.4 kg)  01/03/17 149 lb 12.8 oz (67.9 kg)     Physical Exam  Constitutional: She is oriented to person, place, and time. She appears well-developed and well-nourished. No distress.  HENT:  Head: Normocephalic and atraumatic.  Eyes: No scleral icterus.  Neck: JVD (JVP 6 cm) present.  Cardiovascular: Normal rate and regular rhythm.   No murmur heard. Pulmonary/Chest: Effort normal. She has no wheezes.  Decreased breath sounds bilat; ?rales L base  Abdominal: Soft. There is no tenderness.  Musculoskeletal: She exhibits edema (1+ bilat ankle edema).  Neurological: She is alert and oriented to person, place, and time.  Skin: Skin is warm and dry.  Psychiatric: She has a normal mood and affect.    ASSESSMENT:     1. Acute on chronic diastolic heart failure ([place])   2. HOCM (hypertrophic obstructive cardiomyopathy) ([place])   3. PAF (paroxysmal atrial fibrillation) ([facility])    PLAN:    In order of problems listed above:  1. Acute on chronic diastolic heart failure ([place]) She presents with volume overload.  This is new for her. She has never had to take a diuretic.  However, her echocardiogram has demonstrated mod diastolic dysfunction.  She did not change her diet significantly while on vacation but question if she may have had more salt than she realizes to explain her current episode of congestive heart failure. I discussed with Dr. Liam Rogers.  Since she had an echocardiogram in June, there is no need to repeat it now (unless we cannot control her congestive heart failure or she worsens).  Her PCP started Lasix and K+ today.  Also, a BMET, CBC, TSH, BNP are pending as well as a CXR.    -  Change Lasix to 20 mg Twice daily x 3 days, then resume 20 mg Once daily   -  Change K+ to 10 mEq Twice daily x 3 days, then resume 10 mEq Once daily   -  Stop Lasix and K+ after 1 week to avoid dehydration (as long as weight back to baseline)  -  BMET 1 week  -  FU with PCP this week and here in 2 weeks.  2. HOCM (hypertrophic obstructive cardiomyopathy) ([facility])  Gradient much better on last echocardiogram.  As noted above, we would want to avoid dehydration.  Therefore, she will stop Lasix and potassium after 1 week (as long as she is back to baseline).    3. PAF (paroxysmal atrial fibrillation) ([facility]) Maintaining NSR.  Continue Apixaban.  CBC pending from PCP office.   Dispo:  Return in about 2 weeks (around 05/02/2017) for Close Follow Up, w/ Richardson Dopp, PA-C.   Medication Adjustments/Labs and Tests Ordered: Current medicines are reviewed at length with the  patient today.  Concerns regarding medicines are outlined above.  Tests Ordered: Orders Placed This Encounter  Procedures  . Basic Metabolic Panel (BMET)   . EKG 12-Lead   Medication Changes: No orders of the defined types were placed in this encounter.   Signed, Richardson Dopp, PA-C  04/18/2017 1:20 PM    [facility] [address], [place], [place]  [address] Phone: [phone]; Fax: [phone]

## 2017-04-26 ENCOUNTER — Other Ambulatory Visit: Payer: Medicare Other

## 2017-05-02 ENCOUNTER — Telehealth: Payer: Self-pay

## 2017-05-02 ENCOUNTER — Other Ambulatory Visit: Payer: Medicare Other

## 2017-05-02 ENCOUNTER — Encounter: Payer: Self-pay | Admitting: Physician Assistant

## 2017-05-02 ENCOUNTER — Ambulatory Visit (INDEPENDENT_AMBULATORY_CARE_PROVIDER_SITE_OTHER): Payer: Medicare Other | Admitting: Physician Assistant

## 2017-05-02 VITALS — BP 142/76 | HR 56 | Ht 62.0 in | Wt 151.8 lb

## 2017-05-02 DIAGNOSIS — I5032 Chronic diastolic (congestive) heart failure: Secondary | ICD-10-CM | POA: Insufficient documentation

## 2017-05-02 DIAGNOSIS — I421 Obstructive hypertrophic cardiomyopathy: Secondary | ICD-10-CM

## 2017-05-02 DIAGNOSIS — I480 Paroxysmal atrial fibrillation: Secondary | ICD-10-CM | POA: Diagnosis not present

## 2017-05-02 LAB — BASIC METABOLIC PANEL
BUN / CREAT RATIO: 25 (ref 12–28)
BUN: 20 mg/dL (ref 8–27)
CHLORIDE: 100 mmol/L (ref 96–106)
CO2: 25 mmol/L (ref 20–29)
Calcium: 9.7 mg/dL (ref 8.7–10.3)
Creatinine, Ser: 0.79 mg/dL (ref 0.57–1.00)
GFR calc non Af Amer: 68 mL/min/{1.73_m2} (ref >59)
GFR, EST AFRICAN AMERICAN: 78 mL/min/{1.73_m2} (ref >59)
GLUCOSE: 96 mg/dL (ref 65–99)
POTASSIUM: 4.8 mmol/L (ref 3.5–5.2)
SODIUM: 138 mmol/L (ref 134–144)

## 2017-05-02 MED ORDER — FUROSEMIDE 20 MG PO TABS: 20.0000 mg | ORAL_TABLET | ORAL | 11 refills | Status: DC | PRN

## 2017-05-02 MED ORDER — POTASSIUM CHLORIDE ER 10 MEQ PO TBCR: 10.0000 meq | EXTENDED_RELEASE_TABLET | ORAL | 11 refills | Status: DC | PRN

## 2017-05-02 NOTE — Telephone Encounter (Signed)
Spoke with patient and advised of recent lab results with verbal understanding. Patient had no questions.

## 2017-05-02 NOTE — Telephone Encounter (Signed)
-----   Message from Liliane Shi, [place] sent at 05/02/2017  4:03 PM EDT ----- Please call patient: The kidney function (BUN, Creatinine) and potassium are normal. All other parameters are within acceptable limits and no further intervention or testing required. Continue with current treatment plan. Richardson Dopp, PA-C    05/02/2017 4:03 PM

## 2017-05-02 NOTE — Progress Notes (Signed)
Cardiology Office Note:    Date:  05/02/2017   ID:  Stacy [name], DOB January 29, 1929, MRN [id number]  PCP:  [name], MD  Cardiologist:  Dr. Liam Rogers    Referring MD: [name], MD   Chief Complaint  Patient presents with  . Congestive Heart Failure    follow up    History of Present Illness:    Stacy [name] is a 83 y.o. female with a hx of paroxysmal atrial fibrillation, hypertrophic obstructive cardiomyopathy, mitral regurgitation, hyperlipidemia, rheumatoid arthritis, diastolic heart failure.  She was admitted in 2/18 with atrial fibrillation with rapid ventricular rate and elevated troponin levels.  Echo in 6/18 demonstrated severe LVH, moderate diastolic dysfunction LVOT gradient 20 mmHg.  Septal thickness was 18.3 mm.  She was recently seen 04/18/17 with complaints of edema and shortness of breath.  When she was referred over from her primary care doctor's office that day.  We adjusted her Lasix.  I reviewed her case with Dr. Acie Fredrickson.  No repeat echocardiogram is felt to be necessary.  Stacy [name] returns for close follow-up on heart failure.  She is here alone.  She is feeling better.  She feels her breathing is back to normal.  The swelling in her legs is down and she feels her legs look back to normal.  She denies any chest discomfort or paroxysmal nocturnal dyspnea.  She has just minimal wheezing now.  She denies any bleeding issues.    Prior CV studies:   The following studies were reviewed today:  Echo 12/27/16 Severe concentric LVH, EF 60-65, normal wall motion, grade 2 diastolic dysfunction, LVOT peak gradient 20 mmHg, septal thickness 18.3 mm, mild aortic stenosis, mild AI, MAC, mild MR, severe LAE, normal RVSF, mild TR, PASP 33  Echo 09/09/16 Moderate concentric LVH with severe septal hypertrophy-consistent with HCM, EF 60-65, dynamic obstruction with peak gradient 94, normal wall motion, septal thickness 17.34 mm, MAC with + SAM, severe MR, severe LAE, normal  RVSF, mild TR, PASP 25  Echo 10/16 Severe LVH, severe focal basal septal hypertrophy with moderate posterior wall hypertrophy, EF 55-60, peak gradient 32, normal wall motion, MAC, + SAM, mild to moderate MR, severe LAE, normal RVSF, trivial TR/P, PASP 28  Nuclear stress test 8/07 EF 69, apical attenuation consistent with breast artifact, no ischemia  Past Medical History:  Diagnosis Date  . Bowel obstruction ([place])   . Diastolic dysfunction   . Gallbladder disease   . GERD (gastroesophageal reflux disease)   . HOCM (hypertrophic obstructive cardiomyopathy) ([place]) 09/27/2016   Echo 10/16: severe LVH, EF 55-60, peak 32, no RWMA, +SAM, mild to mod MR, severe LAE, PASP 28 // Echo 2/18: mod conc LVH, severe septal LVH c/w HCM, EF 60-65, peak LVOT 94, no RWMA, + SAM, severe MR, severe LAE, PASP 25 // Echo 6/18: severe conc LVH, EF 60-65, noRWMA, Gr 2 DD, peak LVOT gradient 20, mild aortic stenosis, mild AI, mild MR, severe LAE, mild TR, PASP 33   . Hypertension   . LVH (left ventricular hypertrophy)    WITH ASYMMETRIC SEPTAL HYPERTROPHY  . Mitral regurgitation 09/09/2016   Severe by echo 08/2016  . Persistent atrial fibrillation ([place]) 09/09/2016  . Rheumatoid arthritis [facility])     Past Surgical History:  Procedure Laterality Date  . CARDIOVASCULAR STRESS TEST  03/10/2006   EF 69%. NO EVIDENCE OF ISCHEMIA  . CHOLECYSTECTOMY    . TOTAL HIP ARTHROPLASTY    . US ECHOCARDIOGRAPHY  02/21/2008  EF 55-60%  . US ECHOCARDIOGRAPHY  07/21/2005   EF 55-60%    Current Medications: Current Meds  Medication Sig  . apixaban (ELIQUIS) 5 MG TABS tablet Take 1 tablet (5 mg total) by mouth 2 (two) times daily.  Marland Kitchen atorvastatin (LIPITOR) 10 MG tablet Take 1 tablet (10 mg total) by mouth daily.  Marland Kitchen diltiazem (CARDIZEM CD) 180 MG 24 hr capsule Take 1 capsule (180 mg total) by mouth daily.  Marland Kitchen escitalopram (LEXAPRO) 5 MG tablet Take 5 mg by mouth every morning.  . folic acid (FOLVITE) 1 MG tablet Take 3 mg by mouth  daily.   . irbesartan (AVAPRO) 150 MG tablet Take 150 mg by mouth daily.   . meclizine (ANTIVERT) 25 MG tablet Take 25 mg by mouth daily as needed. As needed for dizziness  . methotrexate (RHEUMATREX) 2.5 MG tablet Take 15 mg by mouth every Saturday. Caution:Chemotherapy. Protect from light.   . metoprolol tartrate (LOPRESSOR) 25 MG tablet TAKE 2 TABLETS BY MOUTH IN THE MORNING AND 1 TABLET  BY MOUTH AT BEDTIME  . omeprazole (PRILOSEC) 20 MG capsule Take 20 mg by mouth daily.   . prednisoLONE acetate (PRED FORTE) 1 % ophthalmic suspension Place 1 drop into both eyes as needed (DRY EYE).   Marland Kitchen PROAIR RESPICLICK 161 (90 Base) MCG/ACT AEPB Inhale 5-6 puffs into the lungs daily.   . ranitidine (ZANTAC) 150 MG tablet Take 150 mg by mouth every evening.   Marland Kitchen rOPINIRole (REQUIP) 1 MG tablet Take 1 mg by mouth at bedtime.   . traMADol (ULTRAM) 50 MG tablet Take 50 mg by mouth daily as needed. As needed for moderate pain  . triamcinolone cream (KENALOG) 0.1 % Apply 1 application topically 2 (two) times daily.   . [DISCONTINUED] furosemide (LASIX) 20 MG tablet Take 20 mg by mouth daily.   . [DISCONTINUED] potassium chloride (K-DUR) 10 MEQ tablet Take 10 mEq by mouth 2 (two) times daily.      Allergies:   Sulfa antibiotics   Social History   Social History  . Marital status: Married    Spouse name: N/A  . Number of children: N/A  . Years of education: N/A   Social History Main Topics  . Smoking status: Never Smoker  . Smokeless tobacco: Never Used  . Alcohol use No  . Drug use: No  . Sexual activity: Not Asked   Other Topics Concern  . None   Social History Narrative  . None     Family Hx: The patient's family history includes Heart attack in her brother; Hypertension in her mother; Leukemia in her father; Stroke in her mother.  ROS:   Please see the history of present illness.    Review of Systems  Cardiovascular: Positive for dyspnea on exertion and leg swelling.  Respiratory:  Positive for wheezing.   Musculoskeletal: Positive for joint pain and myalgias.   All other systems reviewed and are negative.   EKGs/Labs/Other Test Reviewed:    EKG:  EKG is not ordered today.    Recent Labs: 09/09/2016: B Natriuretic Peptide 379.5 09/10/2016: ALT 16; Magnesium 2.1; TSH 1.531 11/01/2016: Hemoglobin 12.8; Platelets 256 11/08/2016: BUN 16; Creatinine, Ser 0.69; Potassium 4.8; Sodium 141   04/18/17 (from PCP office): BUN 20, creatinine 0.8, K 4.3, hemoglobin 12.0, ALT 27, TSH 1.76  Recent Lipid Panel Lab Results  Component Value Date/Time   CHOL 185 09/10/2016 12:12 AM   TRIG 136 09/10/2016 12:12 AM   HDL 43 09/10/2016 12:12 AM  CHOLHDL 4.3 09/10/2016 12:12 AM   LDLCALC 115 (H) 09/10/2016 12:12 AM    Physical Exam:    VS:  BP (!) 142/76   Pulse (!) 56   Ht _0  (1.575 m)   Wt 151 lb 12.8 oz (68.9 kg)   LMP  (LMP Unknown)   SpO2 97%   BMI 27.76 kg/m     Wt Readings from Last 3 Encounters:  05/02/17 151 lb 12.8 oz (68.9 kg)  04/18/17 155 lb 6.4 oz (70.5 kg)  01/11/17 142 lb (64.4 kg)     Physical Exam  Constitutional: She is oriented to person, place, and time. She appears well-developed and well-nourished. No distress.  HENT:  Head: Normocephalic and atraumatic.  Neck: No JVD present.  Cardiovascular: Normal rate and regular rhythm.   Murmur heard.  Crescendo-decrescendo systolic murmur is present with a grade of 3/6  at the upper right sternal border, upper left sternal border Pulmonary/Chest: She has no wheezes. She has no rales.  Abdominal: Soft.  Musculoskeletal: She exhibits no edema (compression stockings in place).  Neurological: She is alert and oriented to person, place, and time.  Skin: Skin is warm and dry.  Psychiatric: She has a normal mood and affect.    ASSESSMENT:    1. Chronic diastolic heart failure ([place])   2. HOCM (hypertrophic obstructive cardiomyopathy) ([place])   3. PAF (paroxysmal atrial fibrillation) ([facility])    PLAN:     In order of problems listed above:  1.  Chronic diastolic heart failure ([facility]) Her weight is down 4 pounds by our scales.  Her symptoms are improved.  Her exam suggests improved volume.  Given her history of hypertrophic obstructive cardiomyopathy, we should avoid having her on daily Lasix to prevent dehydration.  Therefore, I will stop her Lasix and potassium.  She can take these on a as needed basis if her weight increases or she notices increasing swelling or increasing shortness of breath.  She should contact us if she has to do this.  Follow-up basic metabolic panel has been obtained today.  2.  HOCM (hypertrophic obstructive cardiomyopathy) ([place]) Recent echocardiogram with improved gradient.  Continue calcium channel blocker and beta-blocker.  3.  PAF (paroxysmal atrial fibrillation) ([facility]) Maintaining normal sinus rhythm by my exam.  Continue anticoagulation with Eliquis.   Dispo:  Return in about 3 months (around 08/02/2017) for Routine Follow Up, w/ Dr. Acie Fredrickson.   Medication Adjustments/Labs and Tests Ordered: Current medicines are reviewed at length with the patient today.  Concerns regarding medicines are outlined above.  Tests Ordered: No orders of the defined types were placed in this encounter.  Medication Changes: Meds ordered this encounter  Medications  . furosemide (LASIX) 20 MG tablet    Sig: Take 1 tablet (20 mg total) by mouth as needed.    Dispense:  30 tablet    Refill:  11  . potassium chloride (K-DUR) 10 MEQ tablet    Sig: Take 1 tablet (10 mEq total) by mouth as needed.    Dispense:  30 tablet    Refill:  [address] [place], [place]  05/02/2017 11:49 AM    [facility] [address], [place], [place]  [address] Phone: [phone]; Fax: [phone]

## 2017-05-02 NOTE — Patient Instructions (Addendum)
Medication Instructions:  1. Stop taking Lasix (Furosemide) and potassium every day.   2. You can take the Lasix and potassium once daily as needed (if your weight increases 3 lbs in 1 day or 5 lbs in 1 week; if you notice worsening swelling; if you notice your breathing is worse).  Call us if you have to take the Lasix and potassium.  Labwork: Already drawn today.   Testing/Procedures: None   Follow-Up: 07/06/17 @ 9:40 WITH DR. Acie Fredrickson.   Any Other Special Instructions Will Be Listed Below (If Applicable).  If you need a refill on your cardiac medications before your next appointment, please call your pharmacy.

## 2017-05-27 ENCOUNTER — Other Ambulatory Visit: Payer: Self-pay | Admitting: Internal Medicine

## 2017-05-27 DIAGNOSIS — Z1231 Encounter for screening mammogram for malignant neoplasm of breast: Secondary | ICD-10-CM

## 2017-06-18 ENCOUNTER — Emergency Department (HOSPITAL_COMMUNITY)
Admission: EM | Admit: 2017-06-18 | Discharge: 2017-06-18 | Disposition: A | Discharge status: Home / Self Care | Payer: Medicare Other | Attending: Emergency Medicine | Admitting: Emergency Medicine

## 2017-06-18 ENCOUNTER — Encounter (HOSPITAL_COMMUNITY): Payer: Self-pay | Admitting: Emergency Medicine

## 2017-06-18 ENCOUNTER — Emergency Department (HOSPITAL_COMMUNITY): Payer: Medicare Other

## 2017-06-18 DIAGNOSIS — R252 Cramp and spasm: Secondary | ICD-10-CM | POA: Insufficient documentation

## 2017-06-18 DIAGNOSIS — I10 Essential (primary) hypertension: Secondary | ICD-10-CM | POA: Insufficient documentation

## 2017-06-18 DIAGNOSIS — Z79899 Other long term (current) drug therapy: Secondary | ICD-10-CM | POA: Insufficient documentation

## 2017-06-18 DIAGNOSIS — Z96642 Presence of left artificial hip joint: Secondary | ICD-10-CM | POA: Insufficient documentation

## 2017-06-18 DIAGNOSIS — M25552 Pain in left hip: Secondary | ICD-10-CM | POA: Diagnosis not present

## 2017-06-18 MED ORDER — ACETAMINOPHEN 325 MG PO TABS
325.0000 mg | ORAL_TABLET | Freq: Once | ORAL | Status: AC
  Administered 2017-06-18: 325 mg via ORAL
  Filled 2017-06-18: qty 1

## 2017-06-18 MED ORDER — METHOCARBAMOL 500 MG PO TABS: 500.0000 mg | ORAL_TABLET | Freq: Every evening | ORAL | 0 refills | Status: DC | PRN

## 2017-06-18 MED ORDER — ACETAMINOPHEN 325 MG PO TABS
650.0000 mg | ORAL_TABLET | Freq: Once | ORAL | Status: AC
  Administered 2017-06-18: 650 mg via ORAL
  Filled 2017-06-18: qty 2

## 2017-06-18 NOTE — Discharge Instructions (Signed)
As discussed, apply heat to the area and take muscle relaxant at night time to help with muscle spasm. Do not, drive, drink alcohol or operate machinery while on this medication.  Take your Tylenol and tramadol as needed for pain.  Follow up with your primary care provider.  Return sooner if symptoms worsen including worsening pain, numbness, weakness, fever, chills, or any other new concerning symptoms in the meantime.

## 2017-06-18 NOTE — ED Notes (Signed)
Pt verbalized understanding of d/c instructions and prescriptions. VSS. Pt wheeled out by this RN, pt going home accompanied by family. All questions answered, all belongings with pt and family

## 2017-06-18 NOTE — ED Notes (Signed)
Pt reports she woke up this AM with severe L hip pain upon weight bearing and movement. Sensation and mobility intact, no deformity noted. Per pt, no injury that precipitated pain.

## 2017-06-18 NOTE — ED Triage Notes (Signed)
Pt states hx of 3 hip replacements. States she woke up this morning with pain 10/10 to left hip, unable to bare weight. Denies injury.

## 2017-06-18 NOTE — ED Provider Notes (Signed)
[facility] EMERGENCY DEPARTMENT Provider Note   CSN: [id number] Arrival date & time: 06/18/17  1242     History   Chief Complaint Chief Complaint  Patient presents with  . Hip Pain    HPI Stacy [name] is a 83 y.o. female with past history significant for 3 hip replacement on the left presenting with sudden onset left hip pain upon awakening this am. Patient reports no trauma or falls but has attempted to change a light bulb while standing on top of a trunk yesterday afternoon and slipped in an awkward position but denies any pain initially. She has tried tylenol for her pain with mild relief. Her pain is exacerbated by wgt bearing and sitting and external rotation. She denies any numbness, weakness, fever, chills, nausea, vomiting, color change, coolness.  Last hip replacement was in 2012 and she denies any complications, infection or pain at the hip like this in the past. He hardware was recalled which was why she had repeat surgery. Patient reports feeling otherwise well. No systemic ill symptoms.  HPI  Past Medical History:  Diagnosis Date  . Bowel obstruction ([place])   . Diastolic dysfunction   . Gallbladder disease   . GERD (gastroesophageal reflux disease)   . HOCM (hypertrophic obstructive cardiomyopathy) ([place]) 09/27/2016   Echo 10/16: severe LVH, EF 55-60, peak 32, no RWMA, +SAM, mild to mod MR, severe LAE, PASP 28 // Echo 2/18: mod conc LVH, severe septal LVH c/w HCM, EF 60-65, peak LVOT 94, no RWMA, + SAM, severe MR, severe LAE, PASP 25 // Echo 6/18: severe conc LVH, EF 60-65, noRWMA, Gr 2 DD, peak LVOT gradient 20, mild aortic stenosis, mild AI, mild MR, severe LAE, mild TR, PASP 33   . Hypertension   . LVH (left ventricular hypertrophy)    WITH ASYMMETRIC SEPTAL HYPERTROPHY  . Mitral regurgitation 09/09/2016   Severe by echo 08/2016  . Persistent atrial fibrillation ([place]) 09/09/2016  . Rheumatoid arthritis [facility])     Patient Active Problem List   Diagnosis Date Noted  . Chronic diastolic heart failure ([place]) 05/02/2017  . Spider veins of both lower extremities 01/11/2017  . Essential hypertension 01/03/2017  . HOCM (hypertrophic obstructive cardiomyopathy) ([place]) 09/27/2016  . Persistent atrial fibrillation ([place]) 09/09/2016  . GERD (gastroesophageal reflux disease) 09/09/2016  . Rheumatoid arthritis ([place]) 09/09/2016  . Mitral regurgitation 09/09/2016  . Elevated troponin 09/09/2016  . Hypertensive heart disease without CHF 06/02/2011  . LVH (left ventricular hypertrophy) 06/02/2011  . Hyperlipidemia 06/02/2011    Past Surgical History:  Procedure Laterality Date  . CARDIOVASCULAR STRESS TEST  03/10/2006   EF 69%. NO EVIDENCE OF ISCHEMIA  . CHOLECYSTECTOMY    . TOTAL HIP ARTHROPLASTY    . US ECHOCARDIOGRAPHY  02/21/2008   EF 55-60%  . US ECHOCARDIOGRAPHY  07/21/2005   EF 55-60%    OB History    No data available       Home Medications    Prior to Admission medications   Medication Sig Start Date End Date Taking? Authorizing Provider  apixaban (ELIQUIS) 5 MG TABS tablet Take 1 tablet (5 mg total) by mouth 2 (two) times daily. 09/10/16   Florencia Reasons, MD  atorvastatin (LIPITOR) 10 MG tablet Take 1 tablet (10 mg total) by mouth daily. 05/02/13   Nahser, Wonda Cheng, MD  diltiazem (CARDIZEM CD) 180 MG 24 hr capsule Take 1 capsule (180 mg total) by mouth daily. 09/11/16   Florencia Reasons, MD  escitalopram [place])  5 MG tablet Take 5 mg by mouth every morning. 04/21/16   [provider]  folic acid (FOLVITE) 1 MG tablet Take 3 mg by mouth daily.     [provider]  furosemide (LASIX) 20 MG tablet Take 1 tablet (20 mg total) by mouth as needed. 05/02/17 07/31/17  [place]son Dopp T, PA-C  irbesartan (AVAPRO) 150 MG tablet Take 150 mg by mouth daily.  03/23/17   [provider]  meclizine (ANTIVERT) 25 MG tablet Take 25 mg by mouth daily as needed. As needed for dizziness 02/07/15   [provider]  methocarbamol  (ROBAXIN) 500 MG tablet Take 1 tablet (500 mg total) by mouth at bedtime as needed for muscle spasms. 06/18/17   Emeline General, PA-C  methotrexate (RHEUMATREX) 2.5 MG tablet Take 15 mg by mouth every Saturday. Caution:Chemotherapy. Protect from light.     [provider]  metoprolol tartrate (LOPRESSOR) 25 MG tablet TAKE 2 TABLETS BY MOUTH IN THE MORNING AND 1 TABLET  BY MOUTH AT BEDTIME    [provider]  omeprazole (PRILOSEC) 20 MG capsule Take 20 mg by mouth daily.  03/26/15   [provider]  potassium chloride (K-DUR) 10 MEQ tablet Take 1 tablet (10 mEq total) by mouth as needed. 05/02/17 07/31/17  [place]son Dopp T, PA-C  prednisoLONE acetate (PRED FORTE) 1 % ophthalmic suspension Place 1 drop into both eyes as needed (DRY EYE).  03/24/17   [provider]  PROAIR RESPICLICK 151 (90 Base) MCG/ACT AEPB Inhale 5-6 puffs into the lungs daily.  04/22/17   [provider]  ranitidine (ZANTAC) 150 MG tablet Take 150 mg by mouth every evening.  04/22/17   [provider]  rOPINIRole (REQUIP) 1 MG tablet Take 1 mg by mouth at bedtime.  03/27/15   [provider]  traMADol (ULTRAM) 50 MG tablet Take 50 mg by mouth daily as needed. As needed for moderate pain 02/12/15   [provider]  triamcinolone cream (KENALOG) 0.1 % Apply 1 application topically 2 (two) times daily.  04/18/17   [provider]    Family History Family History  Problem Relation Age of Onset  . Leukemia Father   . Heart attack Brother   . Hypertension Mother   . Stroke Mother     Social History Social History   Tobacco Use  . Smoking status: Never Smoker  . Smokeless tobacco: Never Used  Substance Use Topics  . Alcohol use: No  . Drug use: No     Allergies   Sulfa antibiotics   Review of Systems Review of Systems  Constitutional: Negative for chills, diaphoresis and fever.  HENT: Negative for ear pain and sore throat.   Respiratory:  Negative for cough, shortness of breath, wheezing and stridor.   Cardiovascular: Negative for chest pain and palpitations.  Gastrointestinal: Negative for abdominal pain, nausea and vomiting.  Genitourinary: Negative for difficulty urinating, dysuria, hematuria and pelvic pain.  Musculoskeletal: Positive for arthralgias and myalgias. Negative for back pain, joint swelling, neck pain and neck stiffness.  Skin: Negative for color change, pallor, rash and wound.  Neurological: Negative for dizziness, seizures, syncope, weakness, light-headedness and numbness.     Physical Exam Updated Vital Signs BP (!) 142/56 (BP Location: Left Arm)   Pulse (!) 55   Temp 98.2 F (36.8 C) (Oral)   Resp 16   LMP  (LMP Unknown)   SpO2 100%   Physical Exam  Constitutional: She appears well-developed and  well-nourished. No distress.  Afebrile, well-appearing, lying comfortably in bed no acute distress.  HENT:  Head: Normocephalic and atraumatic.  Eyes: Conjunctivae are normal.  Neck: Neck supple.  Cardiovascular: Normal rate, regular rhythm and intact distal pulses.  Murmur heard. Crescendo decrescendo systolic murmur  Pulmonary/Chest: Effort normal and breath sounds normal. No stridor. No respiratory distress. She has no wheezes. She has no rales.  Abdominal: Soft. There is no tenderness.  Musculoskeletal: Normal range of motion. She exhibits tenderness. She exhibits no edema or deformity.  Full rom, no warmth, ttp of posterior hip. Pain exacerbated by external rotation.  Neurological: She is alert. No sensory deficit. She exhibits normal muscle tone.  Skin: Skin is warm and dry. No rash noted. She is not diaphoretic. No erythema. No pallor.  Psychiatric: She has a normal mood and affect.  Nursing note and vitals reviewed.    ED Treatments / Results  Labs (all labs ordered are listed, but only abnormal results are displayed) Labs Reviewed - No data to display  EKG  EKG Interpretation None         Radiology Dg Hip Unilat W Or Wo Pelvis 2-3 Views Left  Result Date: 06/18/2017 CLINICAL DATA:  Left hip pain and unable to bear weight. EXAM: DG HIP (WITH OR WITHOUT PELVIS) 2-3V LEFT COMPARISON:  04/24/2016 FINDINGS: Status post left total hip replacement. No evidence for hardware complication. Femoral component appears located on this exam. SI joints and symphysis pubis are unremarkable. IMPRESSION: Negative. Electronically Signed   By: Misty Stanley M.D.   On: 06/18/2017 13:34    Procedures Procedures (including critical care time)  Medications Ordered in ED Medications  acetaminophen (TYLENOL) tablet 650 mg (650 mg Oral Given 06/18/17 1630)  acetaminophen (TYLENOL) tablet 325 mg (325 mg Oral Given 06/18/17 1639)     Initial Impression / Assessment and Plan / ED Course  I have reviewed the triage vital signs and the nursing notes.  Pertinent labs & imaging results that were available during my care of the patient were reviewed by me and considered in my medical decision making (see chart for details).    Patient presenting with sudden onset left hip pain upon awakening this am. No fall or trauma but did slip while changing a bulb standing on a trunk yesterday.  No systemic symptoms, full rom, no concern for septic arthritis. NVI. Pain managed in ED. Imaging negative for acute abnormality or hardware abnormality. Will ambulate in the hall and reassess. Patient was able to ambulate using her walker in the ED. Patient is otherwise well-appearing, pain free while lying in bed.  Dc home with symptomatic relief and close PCP follow-up. Patient was discussed with Dr. Leonette Monarch who has seen patient and agrees with assessment and plan.  Discussed strict return precautions and advised to return to the emergency department if experiencing any new or worsening symptoms. Instructions were understood and patient agreed with discharge plan. Final Clinical Impressions(s) / ED Diagnoses    Final diagnoses:  Left hip pain  Spasm    ED Discharge Orders        Ordered    methocarbamol (ROBAXIN) 500 MG tablet  At bedtime PRN     06/18/17 1721       Dossie Der 06/18/17 1725    Fatima Blank, MD 06/18/17 2014

## 2017-06-18 NOTE — ED Notes (Signed)
Assisted Patient with walker, had no Shortness of Breath, no Dizziness. Had some pain in her hip during ambulation. Patient walked with walker, no significant abnormalities, However while attempting to ambulate without walker, was able to take a few steps but exhibited more pain as a result. Patient stated that she has a Environmental consultant and a Radio producer at home.

## 2017-06-29 ENCOUNTER — Ambulatory Visit
Admission: RE | Admit: 2017-06-29 | Discharge: 2017-06-29 | Disposition: A | Discharge status: Home / Self Care | Payer: Medicare Other | Source: Ambulatory Visit | Attending: Internal Medicine | Admitting: Internal Medicine

## 2017-06-29 DIAGNOSIS — Z1231 Encounter for screening mammogram for malignant neoplasm of breast: Secondary | ICD-10-CM

## 2017-07-06 ENCOUNTER — Ambulatory Visit: Payer: Medicare Other | Admitting: Cardiovascular Disease

## 2017-07-15 ENCOUNTER — Other Ambulatory Visit: Payer: Self-pay | Admitting: Internal Medicine

## 2017-07-15 DIAGNOSIS — Z1231 Encounter for screening mammogram for malignant neoplasm of breast: Secondary | ICD-10-CM

## 2017-08-08 ENCOUNTER — Ambulatory Visit
Admission: RE | Admit: 2017-08-08 | Discharge: 2017-08-08 | Disposition: A | Discharge status: Home / Self Care | Payer: Medicare Other | Source: Ambulatory Visit | Attending: Internal Medicine | Admitting: Internal Medicine

## 2017-08-08 DIAGNOSIS — Z1231 Encounter for screening mammogram for malignant neoplasm of breast: Secondary | ICD-10-CM

## 2017-08-10 ENCOUNTER — Other Ambulatory Visit: Payer: Self-pay | Admitting: Internal Medicine

## 2017-08-10 DIAGNOSIS — R928 Other abnormal and inconclusive findings on diagnostic imaging of breast: Secondary | ICD-10-CM

## 2017-08-16 ENCOUNTER — Ambulatory Visit: Admission: RE | Admit: 2017-08-16 | Payer: Medicare Other | Source: Ambulatory Visit

## 2017-08-16 ENCOUNTER — Ambulatory Visit
Admission: RE | Admit: 2017-08-16 | Discharge: 2017-08-16 | Disposition: A | Discharge status: Home / Self Care | Payer: Medicare Other | Source: Ambulatory Visit | Attending: Internal Medicine | Admitting: Internal Medicine

## 2017-08-16 DIAGNOSIS — R928 Other abnormal and inconclusive findings on diagnostic imaging of breast: Secondary | ICD-10-CM

## 2017-09-26 ENCOUNTER — Encounter: Payer: Self-pay | Admitting: Cardiovascular Disease

## 2017-09-26 ENCOUNTER — Ambulatory Visit: Payer: Medicare Other | Admitting: Cardiovascular Disease

## 2017-09-26 VITALS — BP 146/64 | HR 58 | Ht 62.0 in | Wt 154.6 lb

## 2017-09-26 DIAGNOSIS — I480 Paroxysmal atrial fibrillation: Secondary | ICD-10-CM | POA: Diagnosis not present

## 2017-09-26 DIAGNOSIS — I421 Obstructive hypertrophic cardiomyopathy: Secondary | ICD-10-CM | POA: Diagnosis not present

## 2017-09-26 DIAGNOSIS — I5032 Chronic diastolic (congestive) heart failure: Secondary | ICD-10-CM | POA: Diagnosis not present

## 2017-09-26 MED ORDER — FUROSEMIDE 20 MG PO TABS: 20.0000 mg | ORAL_TABLET | ORAL | 3 refills | Status: DC

## 2017-09-26 MED ORDER — POTASSIUM CHLORIDE ER 10 MEQ PO TBCR: 10.0000 meq | EXTENDED_RELEASE_TABLET | ORAL | 3 refills | Status: DC

## 2017-09-26 NOTE — Patient Instructions (Addendum)
Medication Instructions:  Your physician has recommended you make the following change in your medication:   START Lasix (Furosemide) 20 mg - take every Mon, Wed, Fri TAKE Kdur (potassium chloride) 10 meq on same days as Lasix   Labwork: Your physician recommends that you return for lab work in: 3 weeks for basic metabolic panel   Testing/Procedures: None Ordered   Follow-Up: Your physician recommends that you schedule a follow-up appointment in: 3 months with Dr. Acie Fredrickson   If you need a refill on your cardiac medications before your next appointment, please call your pharmacy.   Thank you for choosing [facility]! Christen Bame, RN [phone]

## 2017-09-26 NOTE — Progress Notes (Signed)
[name] Date of Birth  1929-05-03 [place] [address]    [place], [place]  [address]    [place], [place]  [address] [phone]  Fax  [phone]  [phone]  Fax [phone]  Problems: 1. 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 Hyperlipidemia 3. Mitral regurgitation 4. Paroxysmal atrial fib (diagnosed Feb. 2018 )  CHads2VASc 5 ( female  age 84, HTN, diastolic CHF)     previous notes   Mrs. Minshall is done very well from a cardiac standpoint. She's not had any episodes of chest pain or shortness of breath. She's had a cough and bronchitis for the past several weeks. She denies any syncope or presyncope. She denies any chest pain.  Her blood pressure has been well-controlled on. She's not had any problems.   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  April 10 , 2014:  No chest pain.  No dyspnea.  BP has been ok.  She gets some exercise - not as much as she should - also has orthopedic issues that limit her.   Sept. 15, 2014:  She has a mild dynamic  LVOT obstruction.   We increased her metoprolol at the last visit and she is doing well.  No dizziness.  She does have some vertigo and takes meclizine.  Sept. 17, 2015: Doing well.  No CP or dyspnea.   Exercising regularly.  Sept. 23, 2016:  Doing well from a cardiac standpoint Had an episode of vertigo a week ago .  Lasted for several minutes.  Did not go to the ER.  Symptoms have resolved completely   October 14, 2015: Ms. Ronda has a dynamic LVOT obstruction that we are managing with metoprolol.   Her HR has been a bit slow She is off the  HCTZ .   i've advised her to avoid being volume depleted.  Has had some dizziness and headaches recently  Taking metoprolol 50 mg in the am , 25 mg in the evening   January 03, 2017:  Shunta is seen today for follow up visit She was found to have  Atrial fib with RVR ( FEb. 21-23 hospitalization) and has been started on Eliquis  She has converted back to NSR / sinus bradycardia  Echo shows normal LV systolic function, grade 2 diastolic dysfunction, mild AS   Has not been taking her Eliquis - having occasional bloody nose. Has some bleeding under her right great toe.  BP is typically better at home   September 26, 2017   Roshini is an 84 year old female with a history of paroxysmal atrial fibrillation, hypertrophic obstructive cardiomyopathy, mitral regurgitation, hyperlipidemia and diastolic heart failure.  She was seen in October, 2018 by Richardson Dopp, PA.  Avoids salt.  Has some dyspnea.  Able to work out regularly .   Has some DOE with exercise  Has not taken her meds - BP is a bit elevated today  She was on Lasix and kdur.   These were stopped by Dr. Joylene Draft  -possibly because of her dynamic outflow tract obstruction. Has some leg swelling    Current Outpatient Medications on File Prior to Visit  Medication Sig Dispense Refill  . apixaban (ELIQUIS) 5 MG TABS tablet Take 1 tablet (5 mg total) by mouth 2 (two) times daily. 60 tablet 0  . diltiazem (CARDIZEM CD) 180 MG 24 hr  capsule Take 1 capsule (180 mg total) by mouth daily. 30 capsule 0  . escitalopram (LEXAPRO) 5 MG tablet Take 5 mg by mouth every morning.    . folic acid (FOLVITE) 1 MG tablet Take 3 mg by mouth daily.     . irbesartan (AVAPRO) 150 MG tablet Take 150 mg by mouth daily.     . meclizine (ANTIVERT) 25 MG tablet Take 25 mg by mouth daily as needed. As needed for dizziness    . methotrexate (RHEUMATREX) 2.5 MG tablet Take 15 mg by mouth every Saturday. Caution:Chemotherapy. Protect from light.     . metoprolol tartrate (LOPRESSOR) 25 MG tablet TAKE 2 TABLETS BY MOUTH IN THE MORNING AND 1 TABLET  BY MOUTH AT BEDTIME    . omeprazole (PRILOSEC) 20 MG capsule Take 20 mg by mouth daily.     Marland Kitchen PROAIR RESPICLICK 147 (90 Base) MCG/ACT AEPB Inhale 5-6 puffs into the lungs  daily.     . ranitidine (ZANTAC) 150 MG tablet Take 150 mg by mouth every evening.     Marland Kitchen rOPINIRole (REQUIP) 1 MG tablet Take 1 mg by mouth at bedtime.     . traMADol (ULTRAM) 50 MG tablet Take 50 mg by mouth daily as needed. As needed for moderate pain    . triamcinolone cream (KENALOG) 0.1 % Apply 1 application topically 2 (two) times daily.     . potassium chloride (K-DUR) 10 MEQ tablet Take 1 tablet (10 mEq total) by mouth as needed. 30 tablet 11   No current facility-administered medications on file prior to visit.     Allergies  Allergen Reactions  . Sulfa Antibiotics Nausea Only    Past Medical History:  Diagnosis Date  . Bowel obstruction ([place])   . Diastolic dysfunction   . Gallbladder disease   . GERD (gastroesophageal reflux disease)   . HOCM (hypertrophic obstructive cardiomyopathy) ([place]) 09/27/2016   Echo 10/16: severe LVH, EF 55-60, peak 32, no RWMA, +SAM, mild to mod MR, severe LAE, PASP 28 // Echo 2/18: mod conc LVH, severe septal LVH c/w HCM, EF 60-65, peak LVOT 94, no RWMA, + SAM, severe MR, severe LAE, PASP 25 // Echo 6/18: severe conc LVH, EF 60-65, noRWMA, Gr 2 DD, peak LVOT gradient 20, mild aortic stenosis, mild AI, mild MR, severe LAE, mild TR, PASP 33   . Hypertension   . LVH (left ventricular hypertrophy)    WITH ASYMMETRIC SEPTAL HYPERTROPHY  . Mitral regurgitation 09/09/2016   Severe by echo 08/2016  . Persistent atrial fibrillation ([place]) 09/09/2016  . Rheumatoid arthritis [facility])     Past Surgical History:  Procedure Laterality Date  . CARDIOVASCULAR STRESS TEST  03/10/2006   EF 69%. NO EVIDENCE OF ISCHEMIA  . CHOLECYSTECTOMY    . TOTAL HIP ARTHROPLASTY    . US ECHOCARDIOGRAPHY  02/21/2008   EF 55-60%  . US ECHOCARDIOGRAPHY  07/21/2005   EF 55-60%    Social History   Tobacco Use  Smoking Status Never Smoker  Smokeless Tobacco Never Used    Social History   Substance and Sexual Activity  Alcohol Use No    Family History  Problem Relation Age  of Onset  . Leukemia Father   . Heart attack Brother   . Hypertension Mother   . Stroke Mother   . Breast cancer Neg Hx     Reviw of Systems:  Reviewed in the HPI.  All other systems are negative.   Physical Exam: Blood pressure (!) 146/64,  pulse (!) 58, height 5\' 2"  (1.575 m), weight 154 lb 9.6 oz (70.1 kg).  GEN: Elderly female, no acute distress. HEENT: Normal NECK: No JVD; No carotid bruits LYMPHATICS: No lymphadenopathy CARDIAC: RR, soft systolic murmur.  Distal pulses are normal. RESPIRATORY:  Clear to auscultation without rales, wheezing or rhonchi  ABDOMEN: Soft, non-tender, non-distended MUSCULOSKELETAL:  No edema; No deformity  SKIN: Warm and dry NEUROLOGIC:  Alert and oriented x 3    ECG:   Assessment / Plan:   1. Paroxysmal atrial fib : CHADS2VASC score 5  ( female, age , HTN, CHF)  She is not had any recurrent episodes of atrial fibrillation.  She seems to be doing well.  2.  Chronic diastolic congestive heart failure: Ms. Deboy is having mildly elevated blood pressure readings.  She has some mild leg edema on occasion-usually later in the afternoon.  She is having some leg swelling and some shortness of breath.  Will start low-dose Lasix 20 mg 3 times a week as well as potassium chloride 10 mEq a week.  He will be important that we do not cause her to become volume depleted.  My hope is that the low-dose Lasix will help diurese her slightly and not cause profound volume depletion.  She will call us in several weeks to let us know how she is doing  1. Left ventricular hypertrophy with mild LVOT obstruction-    Mrs. Picinich has a history of mild LVOT obstruction.  She had an echo last year that recorded LVOT gradient of 94 mmHg.  Subsequent echoes have not shown that degree of dynamic obstruction.  I wonder if that echo had some contamination of mitral regurgitation.  Medically she is not acting like she has a gradient of 94 mmHg.  We will watch her closely as we  add in low-dose Lasix and potassium.. Check BMP and Kdur in 3 weeks   2. Hyperlipidemia  -managed by Dr. Joylene Draft  3. Mitral regurgitation-   stable  I will see her again in 3 months    [name], MD  09/26/2017 8:49 AM    [place],  [place], [place]  [address] Pager [phone] Phone: [phone]; Fax: [phone]

## 2017-10-17 ENCOUNTER — Other Ambulatory Visit: Payer: Medicare Other | Admitting: *Deleted

## 2017-10-17 DIAGNOSIS — I480 Paroxysmal atrial fibrillation: Secondary | ICD-10-CM

## 2017-10-17 DIAGNOSIS — I5032 Chronic diastolic (congestive) heart failure: Secondary | ICD-10-CM

## 2017-10-17 DIAGNOSIS — I421 Obstructive hypertrophic cardiomyopathy: Secondary | ICD-10-CM

## 2017-10-18 LAB — BASIC METABOLIC PANEL
BUN/Creatinine Ratio: 29 — ABNORMAL HIGH (ref 12–28)
BUN: 22 mg/dL (ref 8–27)
CO2: 23 mmol/L (ref 20–29)
CREATININE: 0.75 mg/dL (ref 0.57–1.00)
Calcium: 9.2 mg/dL (ref 8.7–10.3)
Chloride: 103 mmol/L (ref 96–106)
GFR, EST AFRICAN AMERICAN: 83 mL/min/{1.73_m2} (ref >59)
GFR, EST NON AFRICAN AMERICAN: 72 mL/min/{1.73_m2} (ref >59)
Glucose: 83 mg/dL (ref 65–99)
Potassium: 4.1 mmol/L (ref 3.5–5.2)
SODIUM: 143 mmol/L (ref 134–144)

## 2017-10-19 ENCOUNTER — Telehealth: Payer: Self-pay | Admitting: Cardiovascular Disease

## 2017-10-19 NOTE — Telephone Encounter (Signed)
New message    1) Are you calling to confirm a diagnosis or obtain personal health information (Y/N)? y   2) If so, what information is requested?  Diagnosis and ejection fraction    Fax [phone]  Please route to Medical Records or your medical records site representative

## 2017-11-06 ENCOUNTER — Encounter (HOSPITAL_COMMUNITY): Payer: Self-pay | Admitting: Emergency Medicine

## 2017-11-06 ENCOUNTER — Other Ambulatory Visit: Payer: Self-pay

## 2017-11-06 ENCOUNTER — Emergency Department (HOSPITAL_COMMUNITY): Payer: Medicare Other

## 2017-11-06 ENCOUNTER — Emergency Department (HOSPITAL_COMMUNITY)
Admission: EM | Admit: 2017-11-06 | Discharge: 2017-11-06 | Disposition: A | Discharge status: Home / Self Care | Payer: Medicare Other | Attending: Emergency Medicine | Admitting: Emergency Medicine

## 2017-11-06 DIAGNOSIS — I110 Hypertensive heart disease with heart failure: Secondary | ICD-10-CM | POA: Diagnosis not present

## 2017-11-06 DIAGNOSIS — Z79899 Other long term (current) drug therapy: Secondary | ICD-10-CM | POA: Diagnosis not present

## 2017-11-06 DIAGNOSIS — J181 Lobar pneumonia, unspecified organism: Secondary | ICD-10-CM | POA: Insufficient documentation

## 2017-11-06 DIAGNOSIS — J189 Pneumonia, unspecified organism: Secondary | ICD-10-CM

## 2017-11-06 DIAGNOSIS — I5032 Chronic diastolic (congestive) heart failure: Secondary | ICD-10-CM | POA: Insufficient documentation

## 2017-11-06 DIAGNOSIS — R0602 Shortness of breath: Secondary | ICD-10-CM | POA: Diagnosis present

## 2017-11-06 LAB — CBC WITH DIFFERENTIAL/PLATELET
BASOS PCT: 0 %
Basophils Absolute: 0 10*3/uL (ref 0.0–0.1)
Eosinophils Absolute: 0.3 10*3/uL (ref 0.0–0.7)
Eosinophils Relative: 3 %
HEMATOCRIT: 34.4 % — AB (ref 36.0–46.0)
HEMOGLOBIN: 10.9 g/dL — AB (ref 12.0–15.0)
Lymphocytes Relative: 24 %
Lymphs Abs: 2.4 10*3/uL (ref 0.7–4.0)
MCH: 30.3 pg (ref 26.0–34.0)
MCHC: 31.7 g/dL (ref 30.0–36.0)
MCV: 95.6 fL (ref 78.0–100.0)
MONOS PCT: 8 %
Monocytes Absolute: 0.7 10*3/uL (ref 0.1–1.0)
NEUTROS ABS: 6.4 10*3/uL (ref 1.7–7.7)
NEUTROS PCT: 65 %
Platelets: 173 10*3/uL (ref 150–400)
RBC: 3.6 MIL/uL — ABNORMAL LOW (ref 3.87–5.11)
RDW: 14.6 % (ref 11.5–15.5)
WBC: 9.8 10*3/uL (ref 4.0–10.5)

## 2017-11-06 LAB — BASIC METABOLIC PANEL
ANION GAP: 9 (ref 5–15)
BUN: 16 mg/dL (ref 6–20)
CALCIUM: 8.9 mg/dL (ref 8.9–10.3)
CHLORIDE: 100 mmol/L — AB (ref 101–111)
CO2: 25 mmol/L (ref 22–32)
Creatinine, Ser: 0.68 mg/dL (ref 0.44–1.00)
GFR calc non Af Amer: >60 mL/min (ref >60)
Glucose, Bld: 105 mg/dL — ABNORMAL HIGH (ref 65–99)
POTASSIUM: 3.8 mmol/L (ref 3.5–5.1)
Sodium: 134 mmol/L — ABNORMAL LOW (ref 135–145)

## 2017-11-06 LAB — I-STAT CG4 LACTIC ACID, ED: Lactic Acid, Venous: 0.98 mmol/L (ref 0.5–1.9)

## 2017-11-06 MED ORDER — AZITHROMYCIN 250 MG PO TABS
500.0000 mg | ORAL_TABLET | Freq: Once | ORAL | Status: AC
  Administered 2017-11-06: 500 mg via ORAL
  Filled 2017-11-06: qty 2

## 2017-11-06 MED ORDER — IPRATROPIUM-ALBUTEROL 0.5-2.5 (3) MG/3ML IN SOLN
3.0000 mL | Freq: Once | RESPIRATORY_TRACT | Status: AC
  Administered 2017-11-06: 3 mL via RESPIRATORY_TRACT
  Filled 2017-11-06: qty 3

## 2017-11-06 MED ORDER — ACETAMINOPHEN 500 MG PO TABS: 500.0000 mg | ORAL_TABLET | Freq: Once | ORAL | Status: DC

## 2017-11-06 MED ORDER — AZITHROMYCIN 250 MG PO TABS: 250.0000 mg | ORAL_TABLET | Freq: Every day | ORAL | 0 refills | Status: AC

## 2017-11-06 MED ORDER — AMOXICILLIN 500 MG PO CAPS: 1000.0000 mg | ORAL_CAPSULE | Freq: Three times a day (TID) | ORAL | 0 refills | Status: AC

## 2017-11-06 MED ORDER — SODIUM CHLORIDE 0.9 % IV BOLUS
500.0000 mL | Freq: Once | INTRAVENOUS | Status: AC
  Administered 2017-11-06: 500 mL via INTRAVENOUS

## 2017-11-06 MED ORDER — SODIUM CHLORIDE 0.9 % IV SOLN
1.0000 g | Freq: Once | INTRAVENOUS | Status: AC
  Administered 2017-11-06: 1 g via INTRAVENOUS
  Filled 2017-11-06: qty 10

## 2017-11-06 MED ORDER — DOXYCYCLINE HYCLATE 100 MG PO CAPS: 100.0000 mg | ORAL_CAPSULE | Freq: Two times a day (BID) | ORAL | 0 refills | Status: DC

## 2017-11-06 NOTE — Discharge Instructions (Addendum)
There is a suspicion for pneumonia not yet showing up on x-ray.  Lab results are encouraging.  Please take all of your antibiotics until finished!   You may develop abdominal discomfort or diarrhea from the antibiotic.  You may help offset this with probiotics which you can buy or get in yogurt. Do not eat or take the probiotics until 2 hours after your antibiotic.   Follow-up with your primary care provider within the next few days for reexamination.  Return to the ED immediately for worsening symptoms.

## 2017-11-06 NOTE — ED Notes (Signed)
Pt walked around the nurses station with steady gait and SPO2 stayed above 96%.

## 2017-11-06 NOTE — ED Notes (Signed)
Pt verbalized understanding discharge instructions and denies any further needs or questions at this time. VS stable, ambulatory and steady gait.   

## 2017-11-06 NOTE — ED Triage Notes (Signed)
Patient presents to ED for assessment of cough, congestion, fevers (102 at home), body aches, laryngitis and now increasing SOB.  Lung sounds diminished.  Patient denies chest pain, nausea, vomiting, light-headedness or dizziness

## 2017-11-06 NOTE — ED Provider Notes (Signed)
[facility] EMERGENCY DEPARTMENT Provider Note   CSN: [id number] Arrival date & time: 11/06/17  1406     History   Chief Complaint Chief Complaint  Patient presents with  . Shortness of Breath    HPI Stacy [name] is a 84 y.o. female.  HPI   Stacy [name] is a 84 y.o. female, with a history of HTN, presenting to the ED with a cough beginning 2 days ago.  Cough is productive with brown sputum accompanied by fever (T-max 102.5 F), shortness of breath, and intermittent headache.  Headache is right frontal, throbbing, moderate, nonradiating.  Patient also notes intermittent loose stool.  She has been taking Tylenol for her fever.  She notes some throat discomfort and hoarseness after coughing, but she states this has been improving. No ABX use in last three months. No recent hospitalizations.  Denies chest pain, abdominal pain, N/V, or any other complaints.  Past Medical History:  Diagnosis Date  . Bowel obstruction ([place])   . Diastolic dysfunction   . Gallbladder disease   . GERD (gastroesophageal reflux disease)   . HOCM (hypertrophic obstructive cardiomyopathy) ([place]) 09/27/2016   Echo 10/16: severe LVH, EF 55-60, peak 32, no RWMA, +SAM, mild to mod MR, severe LAE, PASP 28 // Echo 2/18: mod conc LVH, severe septal LVH c/w HCM, EF 60-65, peak LVOT 94, no RWMA, + SAM, severe MR, severe LAE, PASP 25 // Echo 6/18: severe conc LVH, EF 60-65, noRWMA, Gr 2 DD, peak LVOT gradient 20, mild aortic stenosis, mild AI, mild MR, severe LAE, mild TR, PASP 33   . Hypertension   . LVH (left ventricular hypertrophy)    WITH ASYMMETRIC SEPTAL HYPERTROPHY  . Mitral regurgitation 09/09/2016   Severe by echo 08/2016  . Persistent atrial fibrillation ([place]) 09/09/2016  . Rheumatoid arthritis [facility])     Patient Active Problem List   Diagnosis Date Noted  . Chronic diastolic heart failure ([place]) 05/02/2017  . Spider veins of both lower extremities 01/11/2017  . Essential  hypertension 01/03/2017  . HOCM (hypertrophic obstructive cardiomyopathy) ([place]) 09/27/2016  . Persistent atrial fibrillation ([place]) 09/09/2016  . GERD (gastroesophageal reflux disease) 09/09/2016  . Rheumatoid arthritis ([place]) 09/09/2016  . Mitral regurgitation 09/09/2016  . Elevated troponin 09/09/2016  . Hypertensive heart disease without CHF 06/02/2011  . LVH (left ventricular hypertrophy) 06/02/2011  . Hyperlipidemia 06/02/2011    Past Surgical History:  Procedure Laterality Date  . CARDIOVASCULAR STRESS TEST  03/10/2006   EF 69%. NO EVIDENCE OF ISCHEMIA  . CHOLECYSTECTOMY    . TOTAL HIP ARTHROPLASTY    . US ECHOCARDIOGRAPHY  02/21/2008   EF 55-60%  . US ECHOCARDIOGRAPHY  07/21/2005   EF 55-60%     OB History   None      Home Medications    Prior to Admission medications   Medication Sig Start Date End Date Taking? Authorizing Provider  apixaban (ELIQUIS) 5 MG TABS tablet Take 1 tablet (5 mg total) by mouth 2 (two) times daily. 09/10/16  Yes Florencia Reasons, MD  diltiazem (CARDIZEM CD) 180 MG 24 hr capsule Take 1 capsule (180 mg total) by mouth daily. 09/11/16  Yes Florencia Reasons, MD  escitalopram (LEXAPRO) 5 MG tablet Take 5 mg by mouth every morning. 04/21/16  Yes [provider]  folic acid (FOLVITE) 1 MG tablet Take 3 mg by mouth daily.    Yes [provider]  furosemide (LASIX) 20 MG tablet Take 1 tablet (20 mg total) by  mouth 3 (three) times a week. 09/26/17  Yes Nahser, Wonda Cheng, MD  gabapentin (NEURONTIN) 100 MG capsule Take 100 mg by mouth daily. 10/17/17  Yes [provider]  irbesartan (AVAPRO) 150 MG tablet Take 150 mg by mouth daily.  03/23/17  Yes [provider]  meclizine (ANTIVERT) 25 MG tablet Take 25 mg by mouth daily as needed. As needed for dizziness 02/07/15  Yes [provider]  methotrexate (RHEUMATREX) 2.5 MG tablet Take 15 mg by mouth every Saturday. Caution:Chemotherapy. Protect from light.    Yes [provider]    metoprolol tartrate (LOPRESSOR) 25 MG tablet TAKE 2 TABLETS BY MOUTH IN THE MORNING AND 1 TABLET  BY MOUTH AT BEDTIME   Yes [provider]  omeprazole (PRILOSEC) 20 MG capsule Take 20 mg by mouth daily.  03/26/15  Yes [provider]  potassium chloride (K-DUR) 10 MEQ tablet Take 1 tablet (10 mEq total) by mouth 3 (three) times a week. 09/26/17  Yes Nahser, Wonda Cheng, MD  PROAIR RESPICLICK [phone] Base) MCG/ACT AEPB Inhale 5-6 puffs into the lungs daily.  04/22/17  Yes [provider]  ranitidine (ZANTAC) 150 MG tablet Take 150 mg by mouth every evening.  04/22/17  Yes [provider]  rOPINIRole (REQUIP) 1 MG tablet Take 1 mg by mouth at bedtime.  03/27/15  Yes [provider]  traMADol (ULTRAM) 50 MG tablet Take 50 mg by mouth daily as needed. As needed for moderate pain 02/12/15  Yes [provider]  amoxicillin (AMOXIL) 500 MG capsule Take 2 capsules (1,000 mg total) by mouth 3 (three) times daily for 5 days. 11/06/17 11/11/17  Joy, Shawn C, PA-C  azithromycin (ZITHROMAX Z-PAK) 250 MG tablet Take 1 tablet (250 mg total) by mouth daily for 4 days. 11/07/17 11/11/17  Joy, Shawn C, PA-C  triamcinolone cream (KENALOG) 0.1 % Apply 1 application topically 2 (two) times daily.  04/18/17   [provider]    Family History Family History  Problem Relation Age of Onset  . Leukemia Father   . Heart attack Brother   . Hypertension Mother   . Stroke Mother   . Breast cancer Neg Hx     Social History Social History   Tobacco Use  . Smoking status: Never Smoker  . Smokeless tobacco: Never Used  Substance Use Topics  . Alcohol use: No  . Drug use: No     Allergies   Sulfa antibiotics   Review of Systems Review of Systems  Constitutional: Positive for fever.  HENT: Positive for sore throat. Negative for congestion and trouble swallowing.   Respiratory: Positive for cough and shortness of breath.   Cardiovascular: Negative for chest  pain.  Gastrointestinal: Negative for abdominal pain, nausea and vomiting.  Neurological: Positive for headaches. Negative for dizziness, weakness and light-headedness.  All other systems reviewed and are negative.    Physical Exam Updated Vital Signs BP 123/65   Pulse (!) 55   Temp 98.1 F (36.7 C) (Oral)   Resp 14   Ht 5\' 2"  (1.575 m)   Wt 68 kg (150 lb)   LMP  (LMP Unknown)   SpO2 98%   BMI 27.44 kg/m   Physical Exam  Constitutional: She appears well-developed and well-nourished. No distress.  HENT:  Head: Normocephalic and atraumatic.  Eyes: Conjunctivae are normal.  Neck: Neck supple.  Cardiovascular: Normal rate, regular rhythm, normal heart sounds and intact distal pulses.  Pulmonary/Chest: She has decreased breath sounds  in the right lower field and the left lower field. She has wheezes in the right lower field.  Patient shows no increased work of breathing.  Speaks in full sentences without difficulty.  Abdominal: Soft. There is no tenderness. There is no guarding.  Musculoskeletal: She exhibits no edema.  Lymphadenopathy:    She has no cervical adenopathy.  Neurological: She is alert.  Skin: Skin is warm and dry. She is not diaphoretic.  Psychiatric: She has a normal mood and affect. Her behavior is normal.  Nursing note and vitals reviewed.    ED Treatments / Results  Labs (all labs ordered are listed, but only abnormal results are displayed) Labs Reviewed  BASIC METABOLIC PANEL - Abnormal; Notable for the following components:      Result Value   Sodium 134 (*)    Chloride 100 (*)    Glucose, Bld 105 (*)    All other components within normal limits  CBC WITH DIFFERENTIAL/PLATELET - Abnormal; Notable for the following components:   RBC 3.60 (*)    Hemoglobin 10.9 (*)    HCT 34.4 (*)    All other components within normal limits  I-STAT CG4 LACTIC ACID, ED    EKG EKG Interpretation  Date/Time:  Sunday November 06 2017 14:15:58 EDT Ventricular Rate:   55 PR Interval:  284 QRS Duration: 106 QT Interval:  456 QTC Calculation: 436 R Axis:   67 Text Interpretation:  Sinus bradycardia with 1st degree A-V block Incomplete left bundle branch block Borderline ECG when compared to prior, now sinus rhythm.  No STEMI Confirmed by Antony Blackbird [id number]) on 11/06/2017 4:15:39 PM   Radiology Dg Chest 2 View  Result Date: 11/06/2017 CLINICAL DATA:  Patient presents to ED for assessment of cough, congestion, fevers (102 at home), body aches, laryngitis and now increasing SOB since Thursday. Lung sounds diminished. Patient denies chest pain, nausea, vomiting, light-headedness or dizziness EXAM: CHEST - 2 VIEW COMPARISON:  09/08/2016 FINDINGS: The heart is mildly enlarged. There are no focal consolidations pleural effusions. No pulmonary edema. IMPRESSION: Stable mild cardiomegaly. Electronically Signed   By: Nolon Nations M.D.   On: 11/06/2017 14:54    Procedures Procedures (including critical care time)  Medications Ordered in ED Medications  acetaminophen (TYLENOL) tablet 500 mg (500 mg Oral Not Given 11/06/17 1826)  ipratropium-albuterol (DUONEB) 0.5-2.5 (3) MG/3ML nebulizer solution 3 mL (3 mLs Nebulization Given 11/06/17 1626)  cefTRIAXone (ROCEPHIN) 1 g in sodium chloride 0.9 % 100 mL IVPB (0 g Intravenous Stopped 11/06/17 1738)  azithromycin (ZITHROMAX) tablet 500 mg (500 mg Oral Given 11/06/17 1703)  sodium chloride 0.9 % bolus 500 mL (0 mLs Intravenous Stopped 11/06/17 1813)     Initial Impression / Assessment and Plan / ED Course  I have reviewed the triage vital signs and the nursing notes.  Pertinent labs & imaging results that were available during my care of the patient were reviewed by me and considered in my medical decision making (see chart for details).  Clinical Course as of Nov 06 2029  Sun Nov 06, 2017  1649 Patient states she feels much better.  Ambulated while maintaining 96-99% SPO2 on room air.  Denies shortness of  breath, chest pain, or any other complaints during ambulation.   [SJ]    Clinical Course User Index [SJ] Joy, Shawn C, PA-C    Patient presents with cough and shortness of breath.  Clinical suspicion for CAP that has not yet shown up on the xray.  No  leukocytosis or lactic acidosis.  Patient ambulated without significant drop in SPO2 or onset of shortness of breath. My suspicion for PE is low in this patient.  Patient showed me a sample of her sputum.  It appears to be thickened sputum, yellow to green in color, with small dots of brown.  I do not think this is consistent with hemoptysis. I do think this patient is appropriate for outpatient therapy at this time.  PCP follow-up. The patient was given instructions for home care as well as return precautions. Patient voices understanding of these instructions, accepts the plan, and is comfortable with discharge.  Findings and plan of care discussed with Antony Blackbird, MD. Dr. Sherry Ruffing personally evaluated and examined this patient.   Vitals:   11/06/17 1411 11/06/17 1547 11/06/17 1630  BP: 127/64 123/65 115/61  Pulse: (!) 56 (!) 55 (!) 54  Resp: 20 14 17   Temp: 98.1 F (36.7 C)    TempSrc: Oral    SpO2: 99% 98% 100%  Weight: 68 kg (150 lb)    Height: 5\' 2"  (1.575 m)       Final Clinical Impressions(s) / ED Diagnoses   Final diagnoses:  Community acquired pneumonia of right lower lobe of lung [facility])    ED Discharge Orders        Ordered    azithromycin (ZITHROMAX Z-PAK) 250 MG tablet  Daily     11/06/17 1837    doxycycline (VIBRAMYCIN) 100 MG capsule  2 times daily,   Status:  Discontinued     11/06/17 1841    amoxicillin (AMOXIL) 500 MG capsule  3 times daily     11/06/17 1845       Layla Maw 11/06/17 2032    Tegeler, Gwenyth Allegra, MD 11/07/17 2210

## 2017-12-19 ENCOUNTER — Telehealth: Payer: Self-pay | Admitting: Cardiovascular Disease

## 2017-12-19 NOTE — Telephone Encounter (Signed)
NEW MESSAGE  Loren from [facility] calling to confirm diagnosis of HF FAX [phone] Phone [phone] ext [phone]  1) Are you calling to confirm a diagnosis or obtain personal health information (Y/N)? YES  2) If so, what information is requested? EF%, ECHO  Please route to Medical Records or your medical records site representative

## 2017-12-19 NOTE — Telephone Encounter (Signed)
Left VM with Stacy [name] at [facility] asking her to fax over the EF% form Left my fax/number.

## 2018-01-02 ENCOUNTER — Encounter: Payer: Self-pay | Admitting: Cardiovascular Disease

## 2018-01-02 ENCOUNTER — Ambulatory Visit: Payer: Medicare Other | Admitting: Cardiovascular Disease

## 2018-01-02 VITALS — BP 102/60 | HR 59 | Ht 62.0 in | Wt 148.6 lb

## 2018-01-02 DIAGNOSIS — I421 Obstructive hypertrophic cardiomyopathy: Secondary | ICD-10-CM

## 2018-01-02 DIAGNOSIS — I480 Paroxysmal atrial fibrillation: Secondary | ICD-10-CM | POA: Diagnosis not present

## 2018-01-02 DIAGNOSIS — I10 Essential (primary) hypertension: Secondary | ICD-10-CM

## 2018-01-02 DIAGNOSIS — I517 Cardiomegaly: Secondary | ICD-10-CM | POA: Diagnosis not present

## 2018-01-02 NOTE — Progress Notes (Signed)
[name] Date of Birth  31-Jul-1928 [place] [address]    [place], [place]  [address]    [place],   [address] [phone]  Fax  [phone]  [phone]  Fax [phone]  Problems: 1. 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 Hyperlipidemia 3. Mitral regurgitation 4. Paroxysmal atrial fib (diagnosed Feb. 2018 )  CHads2VASc 5 ( female  age 84, HTN, diastolic CHF)     previous notes   [name] is done very well from a cardiac standpoint. She's not had any episodes of chest pain or shortness of breath. She's had a cough and bronchitis for the past several weeks. She denies any syncope or presyncope. She denies any chest pain.  Her blood pressure has been well-controlled on. She's not had any problems.   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  April 10 , 2014:  No chest pain.  No dyspnea.  BP has been ok.  She gets some exercise - not as much as she should - also has orthopedic issues that limit her.   Sept. 15, 2014:  She has a mild dynamic  LVOT obstruction.   We increased her metoprolol at the last visit and she is doing well.  No dizziness.  She does have some vertigo and takes meclizine.  Sept. 17, 2015: Doing well.  No CP or dyspnea.   Exercising regularly.  Sept. 23, 2016:  Doing well from a cardiac standpoint Had an episode of vertigo a week ago .  Lasted for several minutes.  Did not go to the ER.  Symptoms have resolved completely   October 14, 2015: [name] has a dynamic LVOT obstruction that we are managing with metoprolol.   Her HR has been a bit slow She is off the  HCTZ .   i've advised her to avoid being volume depleted.  Has had some dizziness and headaches recently  Taking metoprolol 50 mg in the am , 25 mg in the evening   January 03, 2017:  Delorse is seen today for follow up visit She was found to have  Atrial fib with RVR ( FEb. 21-23 hospitalization) and has been started on Eliquis  She has converted back to NSR / sinus bradycardia  Echo shows normal LV systolic function, grade 2 diastolic dysfunction, mild AS   Has not been taking her Eliquis - having occasional bloody nose. Has some bleeding under her right great toe.  BP is typically better at home   September 26, 2017   [name] is an 84 year old female with a history of paroxysmal atrial fibrillation, hypertrophic obstructive cardiomyopathy, mitral regurgitation, hyperlipidemia and diastolic heart failure.  She was seen in October, 2018 by Richardson Dopp, PA.  Avoids salt.  Has some dyspnea.  Able to work out regularly .   Has some DOE with exercise  Has not taken her meds - BP is a bit elevated today  She was on Lasix and kdur.   These were stopped by Dr. Joylene Draft  -possibly because of her dynamic outflow tract obstruction. Has some leg swelling    January 02, 2018:  [name] is seen back today for follow-up of her chronic diastolic congestive heart failure and her LVOT obstruction.  We added Lasix 20 mg 3 times a week as well as potassium chloride 10 mEq 3 times a week. She  is feeling much better at this point.   No syncope . Getting regular exercise   Current Outpatient Medications on File Prior to Visit  Medication Sig Dispense Refill  . apixaban (ELIQUIS) 5 MG TABS tablet Take 1 tablet (5 mg total) by mouth 2 (two) times daily. 60 tablet 0  . diltiazem (CARDIZEM CD) 180 MG 24 hr capsule Take 1 capsule (180 mg total) by mouth daily. 30 capsule 0  . escitalopram (LEXAPRO) 5 MG tablet Take 5 mg by mouth every morning.    . folic acid (FOLVITE) 1 MG tablet Take 3 mg by mouth daily.     . furosemide (LASIX) 20 MG tablet Take 1 tablet (20 mg total) by mouth 3 (three) times a week. 45 tablet 3  . gabapentin (NEURONTIN) 100 MG capsule Take 100 mg by mouth daily.    . irbesartan (AVAPRO) 150 MG tablet Take 150 mg by mouth daily.     .  meclizine (ANTIVERT) 25 MG tablet Take 25 mg by mouth daily as needed. As needed for dizziness    . methotrexate (RHEUMATREX) 2.5 MG tablet Take 15 mg by mouth every Saturday. Caution:Chemotherapy. Protect from light.     . metoprolol tartrate (LOPRESSOR) 25 MG tablet TAKE 2 TABLETS BY MOUTH IN THE MORNING AND 1 TABLET  BY MOUTH AT BEDTIME    . omeprazole (PRILOSEC) 20 MG capsule Take 20 mg by mouth daily.     . potassium chloride (K-DUR) 10 MEQ tablet Take 1 tablet (10 mEq total) by mouth 3 (three) times a week. 45 tablet 3  . PROAIR RESPICLICK 588 (90 Base) MCG/ACT AEPB Inhale 5-6 puffs into the lungs daily.     . ranitidine (ZANTAC) 150 MG tablet Take 150 mg by mouth every evening.     Marland Kitchen rOPINIRole (REQUIP) 1 MG tablet Take 1 mg by mouth at bedtime.     . traMADol (ULTRAM) 50 MG tablet Take 50 mg by mouth daily as needed. As needed for moderate pain    . triamcinolone cream (KENALOG) 0.1 % Apply 1 application topically 2 (two) times daily.      No current facility-administered medications on file prior to visit.     Allergies  Allergen Reactions  . Sulfa Antibiotics Nausea Only    Past Medical History:  Diagnosis Date  . Bowel obstruction ([place])   . Diastolic dysfunction   . Gallbladder disease   . GERD (gastroesophageal reflux disease)   . HOCM (hypertrophic obstructive cardiomyopathy) ([place]) 09/27/2016   Echo 10/16: severe LVH, EF 55-60, peak 32, no RWMA, +SAM, mild to mod MR, severe LAE, PASP 28 // Echo 2/18: mod conc LVH, severe septal LVH c/w HCM, EF 60-65, peak LVOT 94, no RWMA, + SAM, severe MR, severe LAE, PASP 25 // Echo 6/18: severe conc LVH, EF 60-65, noRWMA, Gr 2 DD, peak LVOT gradient 20, mild aortic stenosis, mild AI, mild MR, severe LAE, mild TR, PASP 33   . Hypertension   . LVH (left ventricular hypertrophy)    WITH ASYMMETRIC SEPTAL HYPERTROPHY  . Mitral regurgitation 09/09/2016   Severe by echo 08/2016  . Persistent atrial fibrillation ([place]) 09/09/2016  . Rheumatoid  arthritis [facility])     Past Surgical History:  Procedure Laterality Date  . CARDIOVASCULAR STRESS TEST  03/10/2006   EF 69%. NO EVIDENCE OF ISCHEMIA  . CHOLECYSTECTOMY    . TOTAL HIP ARTHROPLASTY    . US ECHOCARDIOGRAPHY  02/21/2008   EF 55-60%  . US ECHOCARDIOGRAPHY  07/21/2005   EF 55-60%    Social History   Tobacco Use  Smoking Status Never Smoker  Smokeless Tobacco Never Used    Social History   Substance and Sexual Activity  Alcohol Use No    Family History  Problem Relation Age of Onset  . Leukemia Father   . Heart attack Brother   . Hypertension Mother   . Stroke Mother   . Breast cancer Neg Hx     Reviw of Systems:  Reviewed in the HPI.  All other systems are negative.   Physical Exam: Blood pressure 102/60, pulse (!) 59, height 5\' 2"  (1.575 m), weight 148 lb 9.6 oz (67.4 kg), SpO2 96 %.  GEN: Elderly female, no acute distress. HEENT: Normal NECK: No JVD; No carotid bruits LYMPHATICS: No lymphadenopathy CARDIAC: RR, 2-3/6 crescendo-decrescendo murmur  RESPIRATORY:  Clear to auscultation without rales, wheezing or rhonchi  ABDOMEN: Soft, non-tender, non-distended MUSCULOSKELETAL:  No edema; No deformity  SKIN: Warm and dry NEUROLOGIC:  Alert and oriented x 3    ECG:   Assessment / Plan:   1. Paroxysmal atrial fib : CHADS2VASC score 5  ( female, age , HTN, CHF)  Has maintained normal sinus rhythm.  2.  Chronic diastolic congestive heart failure: Symptoms are much better after starting Lasix and potassium 3 days a week.. We have been cautious to give her the Lasix only 3 days a week to avoid volume depletion which could potentially worsen her LVOT dynamic obstruction    1. Left ventricular hypertrophy with mild LVOT obstruction-    Mrs. Deats has a history of mild LVOT obstruction.  She had an echo last year that recorded LVOT gradient of 94 mmHg.  Subsequent echoes have not shown that degree of dynamic obstruction.   She has a murmur consistent  with dynamic LVOT obstruction.  She is asymptomatic at this time.   2. Hyperlipidemia  -followed by her primary medical doctor.  3. Mitral regurgitation-   stable.   She will return to see Pecolia Ades, NP in 6 months and will see he in 1 year    [name], MD  01/02/2018 10:39 AM    [place],  [place], [place]  [address] Pager [phone] Phone: [phone]; Fax: [phone]

## 2018-01-02 NOTE — Patient Instructions (Signed)
Medication Instructions:  Your physician recommends that you continue on your current medications as directed. Please refer to the Current Medication list given to you today.   Labwork: None Ordered   Testing/Procedures: None Ordered   Follow-Up: Your physician wants you to follow-up in: 6 months with Nina Hammond, NP or another member of Dr. Nahser's team. You will receive a reminder letter in the mail two months in advance. If you don't receive a letter, please call our office to schedule the follow-up appointment.   If you need a refill on your cardiac medications before your next appointment, please call your pharmacy.   Thank you for choosing [facility]! Karrine Kluttz, RN [phone]    

## 2018-02-21 ENCOUNTER — Telehealth: Payer: Self-pay

## 2018-02-21 NOTE — Telephone Encounter (Signed)
Sent referral to scheduling and filed notes 

## 2018-02-22 ENCOUNTER — Ambulatory Visit: Payer: Medicare Other | Admitting: Physician Assistant

## 2018-02-22 ENCOUNTER — Encounter: Payer: Self-pay | Admitting: Physician Assistant

## 2018-02-22 VITALS — BP 110/60 | HR 60 | Ht 62.0 in | Wt 149.8 lb

## 2018-02-22 DIAGNOSIS — I421 Obstructive hypertrophic cardiomyopathy: Secondary | ICD-10-CM

## 2018-02-22 DIAGNOSIS — I480 Paroxysmal atrial fibrillation: Secondary | ICD-10-CM | POA: Diagnosis not present

## 2018-02-22 DIAGNOSIS — I340 Nonrheumatic mitral (valve) insufficiency: Secondary | ICD-10-CM

## 2018-02-22 DIAGNOSIS — I5032 Chronic diastolic (congestive) heart failure: Secondary | ICD-10-CM

## 2018-02-22 DIAGNOSIS — I10 Essential (primary) hypertension: Secondary | ICD-10-CM | POA: Diagnosis not present

## 2018-02-22 DIAGNOSIS — I119 Hypertensive heart disease without heart failure: Secondary | ICD-10-CM

## 2018-02-22 MED ORDER — AMIODARONE HCL 200 MG PO TABS: 200.0000 mg | ORAL_TABLET | Freq: Two times a day (BID) | ORAL | 1 refills | Status: DC

## 2018-02-22 NOTE — Progress Notes (Signed)
Cardiology Office Note    Date:  02/22/2018   ID:  Stacy [name], DOB 07/06/1929, MRN [id number]  PCP:  [name], MD  Cardiologist: Dr. Acie Fredrickson  Chief Complaint: AFib  History of Present Illness:   Stacy [name] is a 84 y.o. female with hx of chronic diastolic CHF, PAF, HOCM, HTN, HLD  and Mitral regurgitation presents for atrial fibrillation.    Echo 08/2016 showed LVOT of 94mg  HG. Subsequent echo 12/2016 have not shown that degree of dynamic obstruction.    She was doing well on cardiac stand point on 3 days of lasix/week when last seen by Dr. Acie Fredrickson 01/02/18.  Here today for afib evaluation.  2-3 weeks history of DOE without chest tightness. This is new. She is very indenpendant and active female. She used to take of household, do yard work and walk but unable to do so due to [place]. Seen by PCP 8/5 and noted in afib/aflutter and sent here. She denies orthopnea, PND, LE edema, palpitations, melena or blood in stool or urine. Compliant with medications however missed Eliquis last night. She is fatigue and tired.   Past Medical History:  Diagnosis Date  . Bowel obstruction ([place])   . Diastolic dysfunction   . Gallbladder disease   . GERD (gastroesophageal reflux disease)   . HOCM (hypertrophic obstructive cardiomyopathy) ([place]) 09/27/2016   Echo 10/16: severe LVH, EF 55-60, peak 32, no RWMA, +SAM, mild to mod MR, severe LAE, PASP 28 // Echo 2/18: mod conc LVH, severe septal LVH c/w HCM, EF 60-65, peak LVOT 94, no RWMA, + SAM, severe MR, severe LAE, PASP 25 // Echo 6/18: severe conc LVH, EF 60-65, noRWMA, Gr 2 DD, peak LVOT gradient 20, mild aortic stenosis, mild AI, mild MR, severe LAE, mild TR, PASP 33   . Hypertension   . LVH (left ventricular hypertrophy)    WITH ASYMMETRIC SEPTAL HYPERTROPHY  . Mitral regurgitation 09/09/2016   Severe by echo 08/2016  . Persistent atrial fibrillation ([place]) 09/09/2016  . Rheumatoid arthritis [facility])     Past Surgical History:  Procedure  Laterality Date  . CARDIOVASCULAR STRESS TEST  03/10/2006   EF 69%. NO EVIDENCE OF ISCHEMIA  . CHOLECYSTECTOMY    . TOTAL HIP ARTHROPLASTY    . US ECHOCARDIOGRAPHY  02/21/2008   EF 55-60%  . US ECHOCARDIOGRAPHY  07/21/2005   EF 55-60%    Current Medications: Prior to Admission medications   Medication Sig Start Date End Date Taking? Authorizing Provider  apixaban (ELIQUIS) 5 MG TABS tablet Take 1 tablet (5 mg total) by mouth 2 (two) times daily. 09/10/16   Florencia Reasons, MD  diltiazem (CARDIZEM CD) 180 MG 24 hr capsule Take 1 capsule (180 mg total) by mouth daily. 09/11/16   Florencia Reasons, MD  escitalopram (LEXAPRO) 5 MG tablet Take 5 mg by mouth every morning. 04/21/16   [provider]  folic acid (FOLVITE) 1 MG tablet Take 3 mg by mouth daily.     [provider]  furosemide (LASIX) 20 MG tablet Take 1 tablet (20 mg total) by mouth 3 (three) times a week. 09/26/17   Nahser, Wonda Cheng, MD  gabapentin (NEURONTIN) 100 MG capsule Take 100 mg by mouth daily. 10/17/17   [provider]  irbesartan (AVAPRO) 150 MG tablet Take 150 mg by mouth daily.  03/23/17   [provider]  meclizine (ANTIVERT) 25 MG tablet Take 25 mg by mouth daily as needed. As needed for dizziness 02/07/15  [provider]  methotrexate (RHEUMATREX) 2.5 MG tablet Take 15 mg by mouth every Saturday. Caution:Chemotherapy. Protect from light.     [provider]  metoprolol tartrate (LOPRESSOR) 25 MG tablet TAKE 2 TABLETS BY MOUTH IN THE MORNING AND 1 TABLET  BY MOUTH AT BEDTIME    [provider]  omeprazole (PRILOSEC) 20 MG capsule Take 20 mg by mouth daily.  03/26/15   [provider]  potassium chloride (K-DUR) 10 MEQ tablet Take 1 tablet (10 mEq total) by mouth 3 (three) times a week. 09/26/17   Nahser, Wonda Cheng, MD  PROAIR RESPICLICK 024 [phone] Base) MCG/ACT AEPB Inhale 5-6 puffs into the lungs daily.  04/22/17   [provider]  ranitidine (ZANTAC) 150 MG tablet Take  150 mg by mouth every evening.  04/22/17   [provider]  rOPINIRole (REQUIP) 1 MG tablet Take 1 mg by mouth at bedtime.  03/27/15   [provider]  traMADol (ULTRAM) 50 MG tablet Take 50 mg by mouth daily as needed. As needed for moderate pain 02/12/15   [provider]  triamcinolone cream (KENALOG) 0.1 % Apply 1 application topically 2 (two) times daily.  04/18/17   [provider]    Allergies:   Sulfa antibiotics   Social History   Socioeconomic History  . Marital status: Married    Spouse name: Not on file  . Number of children: Not on file  . Years of education: Not on file  . Highest education level: Not on file  Occupational History  . Not on file  Social Needs  . Financial resource strain: Not on file  . Food insecurity:    Worry: Not on file    Inability: Not on file  . Transportation needs:    Medical: Not on file    Non-medical: Not on file  Tobacco Use  . Smoking status: Never Smoker  . Smokeless tobacco: Never Used  Substance and Sexual Activity  . Alcohol use: No  . Drug use: No  . Sexual activity: Not on file  Lifestyle  . Physical activity:    Days per week: Not on file    Minutes per session: Not on file  . Stress: Not on file  Relationships  . Social connections:    Talks on phone: Not on file    Gets together: Not on file    Attends religious service: Not on file    Active member of club or organization: Not on file    Attends meetings of clubs or organizations: Not on file    Relationship status: Not on file  Other Topics Concern  . Not on file  Social History Narrative  . Not on file     Family History:  The patient's family history includes Heart attack in her brother; Hypertension in her mother; Leukemia in her father; Stroke in her mother.   ROS:   Please see the history of present illness.    ROS All other systems reviewed and are negative.   PHYSICAL EXAM:   VS:  BP 110/60   Pulse 60   Ht 5\' 2"   (1.575 m)   Wt 149 lb 12.8 oz (67.9 kg)   LMP  (LMP Unknown)   SpO2 97%   BMI 27.40 kg/m    GEN: Well nourished, well developed, in no acute distress  HEENT: normal  Neck: no carotid bruits, or masses. + JVD Cardiac: Irregularly irregular, + murmurs, rubs, or gallops,no edema  Respiratory:  clear to auscultation bilaterally, normal work of breathing GI: soft, nontender, nondistended, + BS MS: no deformity or atrophy  Skin: warm and dry, no rash Neuro:  Alert and Oriented x 3, Strength and sensation are intact Psych: euthymic mood, full affect  Wt Readings from Last 3 Encounters:  02/22/18 149 lb 12.8 oz (67.9 kg)  01/02/18 148 lb 9.6 oz (67.4 kg)  11/06/17 150 lb (68 kg)      Studies/Labs Reviewed:   EKG:  EKG is ordered today.  The ekg done by PCP 02/20/18 demonstrates atypical atrial flutter at variable rate   Recent Labs: 11/06/2017: BUN 16; Creatinine, Ser 0.68; Hemoglobin 10.9; Platelets 173; Potassium 3.8; Sodium 134   Lipid Panel    Component Value Date/Time   CHOL 185 09/10/2016 0012   TRIG 136 09/10/2016 0012   HDL 43 09/10/2016 0012   CHOLHDL 4.3 09/10/2016 0012   VLDL 27 09/10/2016 0012   LDLCALC 115 (H) 09/10/2016 0012    Additional studies/ records that were reviewed today include:   Echocardiogram: 12/2016 Study Conclusions  - Left ventricle: The cavity size was normal. There was severe   concentric hypertrophy. Systolic function was normal. The   estimated ejection fraction was in the range of 60% to 65%. Wall   motion was normal; there were no regional wall motion   abnormalities. Features are consistent with a pseudonormal left   ventricular filling pattern, with concomitant abnormal relaxation   and increased filling pressure (grade 2 diastolic dysfunction).   Doppler parameters are consistent with elevated ventricular   end-diastolic filling pressure. - Aortic valve: There was mild stenosis. There was mild   regurgitation. - Mitral valve:  Calcified annulus. Mildly thickened leaflets .   There was mild regurgitation. - Left atrium: The atrium was severely dilated. - Right ventricle: The cavity size was normal. Wall thickness was   normal. Systolic function was normal. - Right atrium: The atrium was normal in size. - Tricuspid valve: There was mild regurgitation. - Pulmonary arteries: Systolic pressure was mildly increased. PA   peak pressure: 33 mm Hg (S). - Inferior vena cava: The vessel was normal in size.   ASSESSMENT & PLAN:    1.  Atrial fibrillation - Patient is symptomatic with afib. She has DOE, fatigue and sleepiness. unfortunately she missed PM dose of Eliquis 8/6. Reviewed with DOD Dr. Marlou Porch. Will start Amiodarone 200mg  BID as loading dose and plan DCCV after 3 weeks from today if still remains in afib. Reduce amiodarone there after.   2. Chronic diastolic CHF - Volume status stable. Continue lasix 3 times/week.   3. HOCM -Echo 08/2016 showed LVOT of 94mg  HG. Subsequent echo 12/2016 have not shown that degree of dynamic obstruction.  May need repeat echo if no improvement.   4. Mitral regurgitation - mild by last echo   5. HTN - BP stable on current medications.      Medication Adjustments/Labs and Tests Ordered: Current medicines are reviewed at length with the patient today.  Concerns regarding medicines are outlined above.  Medication changes, Labs and Tests ordered today are listed in the Patient Instructions below. Patient Instructions  Medication Instructions:  Your physician recommends that you continue on your current medications as directed. Please refer to the Current Medication list given to you today.   Labwork: None ordered  Testing/Procedures: None ordered  Follow-Up: Your physician recommends that you schedule a follow-up appointment in:    Any Other Special Instructions Will Be Listed  Below (If Applicable).     If you need a refill on your cardiac medications before your  next appointment, please call your pharmacy.      Jarrett Soho, [place]  02/22/2018 3:34 PM    [facility] [place], [place], [place]  [address] Phone: [phone]; Fax: [phone]

## 2018-02-22 NOTE — Patient Instructions (Addendum)
Medication Instructions:  Your physician has recommended you make the following change in your medication:  1.  START Amiodarone 200 mg taking 1 tablet by mouth twice a day  Labwork: None ordered  Testing/Procedures: None ordered  Follow-Up: Your physician recommends that you schedule a follow-up appointment in: 03/10/18 ARRIVE AT 1:45 TO SEE NINA HAMMOND, PA-C   Any Other Special Instructions Will Be Listed Below (If Applicable).     If you need a refill on your cardiac medications before your next appointment, please call your pharmacy.

## 2018-03-10 ENCOUNTER — Encounter: Payer: Self-pay | Admitting: Cardiology

## 2018-03-10 ENCOUNTER — Ambulatory Visit: Payer: Medicare Other | Admitting: Cardiology

## 2018-03-10 VITALS — BP 126/60 | HR 61 | Ht 62.0 in | Wt 154.0 lb

## 2018-03-10 DIAGNOSIS — I421 Obstructive hypertrophic cardiomyopathy: Secondary | ICD-10-CM

## 2018-03-10 DIAGNOSIS — I340 Nonrheumatic mitral (valve) insufficiency: Secondary | ICD-10-CM

## 2018-03-10 DIAGNOSIS — I5032 Chronic diastolic (congestive) heart failure: Secondary | ICD-10-CM

## 2018-03-10 DIAGNOSIS — I4819 Other persistent atrial fibrillation: Secondary | ICD-10-CM

## 2018-03-10 DIAGNOSIS — I481 Persistent atrial fibrillation: Secondary | ICD-10-CM | POA: Diagnosis not present

## 2018-03-10 DIAGNOSIS — I119 Hypertensive heart disease without heart failure: Secondary | ICD-10-CM | POA: Diagnosis not present

## 2018-03-10 NOTE — H&P (View-Only) (Signed)
Cardiology Office Note:    Date:  03/10/2018   ID:  Stacy [name], DOB Oct 08, 1928, MRN [id number]  PCP:  [name], MD  Cardiologist:  [name], MD  Referring MD: [name], MD   Chief Complaint  Patient presents with  . Follow-up    atrial fibrillation    History of Present Illness:    Stacy [name] is a 84 y.o. female with a past medical history significant for chronic diastolic CHF, PAF on eliquis, HOCM, HTN, HLD  and Mitral regurgitation. She saw [name], PA on 02/22/18 at which time she was complaining of new DOE over the priro 2-3 weeks without chest tightness. She was noted to be in atypical atrial flutter with variable rate. She had missed a dose of Eliquis recently and so could not undergo DCCV at that time. She was started on an amiodarone load with 200 mg BID with plan for DCCV in 3 weeks if still in afib.   Today she is here for follow up of afib. She is still short of breath with activity like walking in from the parking deck. Prior to this she was very active with no exertional symptoms. She has an intermittent dull ache in the center of her chest that lasts for only a few seconds, not related to activity, once or twice a week. She denies orthopnea, PND, edema or palpitations. She has had occ lightheadedness but no syncope. She did have a fall in June after begin lightheadedness.  She has a history of dizziness for years and takes meclizine as needed.     Past Medical History:  Diagnosis Date  . Bowel obstruction ([place])   . Diastolic dysfunction   . Gallbladder disease   . GERD (gastroesophageal reflux disease)   . HOCM (hypertrophic obstructive cardiomyopathy) ([place]) 09/27/2016   Echo 10/16: severe LVH, EF 55-60, peak 32, no RWMA, +SAM, mild to mod MR, severe LAE, PASP 28 // Echo 2/18: mod conc LVH, severe septal LVH c/w HCM, EF 60-65, peak LVOT 94, no RWMA, + SAM, severe MR, severe LAE, PASP 25 // Echo 6/18: severe conc LVH, EF 60-65, noRWMA, Gr 2 DD,  peak LVOT gradient 20, mild aortic stenosis, mild AI, mild MR, severe LAE, mild TR, PASP 33   . Hypertension   . LVH (left ventricular hypertrophy)    WITH ASYMMETRIC SEPTAL HYPERTROPHY  . Mitral regurgitation 09/09/2016   Severe by echo 08/2016  . Persistent atrial fibrillation ([name]) 09/09/2016  . Rheumatoid arthritis [facility])     Past Surgical History:  Procedure Laterality Date  . CARDIOVASCULAR STRESS TEST  03/10/2006   EF 69%. NO EVIDENCE OF ISCHEMIA  . CHOLECYSTECTOMY    . TOTAL HIP ARTHROPLASTY    . US ECHOCARDIOGRAPHY  02/21/2008   EF 55-60%  . US ECHOCARDIOGRAPHY  07/21/2005   EF 55-60%    Current Medications: Current Meds  Medication Sig  . amiodarone (PACERONE) 200 MG tablet Take 1 tablet (200 mg total) by mouth 2 (two) times daily.  Marland Kitchen apixaban (ELIQUIS) 5 MG TABS tablet Take 1 tablet (5 mg total) by mouth 2 (two) times daily.  Marland Kitchen diltiazem (CARDIZEM CD) 180 MG 24 hr capsule Take 1 capsule (180 mg total) by mouth daily.  Marland Kitchen escitalopram (LEXAPRO) 5 MG tablet Take 5 mg by mouth every morning.  . folic acid (FOLVITE) 1 MG tablet Take 3 mg by mouth daily.   . furosemide (LASIX) 20 MG tablet Take 1 tablet (20 mg total) by mouth  3 (three) times a week.  . gabapentin (NEURONTIN) 100 MG capsule Take 100 mg by mouth daily.  . irbesartan (AVAPRO) 150 MG tablet Take 150 mg by mouth daily.   . meclizine (ANTIVERT) 25 MG tablet Take 25 mg by mouth daily as needed. As needed for dizziness  . methotrexate (RHEUMATREX) 2.5 MG tablet Take 15 mg by mouth every Saturday. Caution:Chemotherapy. Protect from light.   . metoprolol tartrate (LOPRESSOR) 25 MG tablet TAKE 2 TABLETS BY MOUTH IN THE MORNING AND 1 TABLET  BY MOUTH AT BEDTIME  . omeprazole (PRILOSEC) 20 MG capsule Take 20 mg by mouth daily.   . potassium chloride (K-DUR) 10 MEQ tablet Take 1 tablet (10 mEq total) by mouth 3 (three) times a week.  Marland Kitchen PROAIR RESPICLICK 735 (90 Base) MCG/ACT AEPB Inhale 5-6 puffs into the lungs daily.   .  ranitidine (ZANTAC) 150 MG tablet Take 150 mg by mouth every evening.   Marland Kitchen rOPINIRole (REQUIP) 1 MG tablet Take 1 mg by mouth at bedtime.   . traMADol (ULTRAM) 50 MG tablet Take 50 mg by mouth daily as needed. As needed for moderate pain  . triamcinolone cream (KENALOG) 0.1 % Apply 1 application topically 2 (two) times daily.      Allergies:   Sulfa antibiotics   Social History   Socioeconomic History  . Marital status: Married    Spouse name: Not on file  . Number of children: Not on file  . Years of education: Not on file  . Highest education level: Not on file  Occupational History  . Not on file  Social Needs  . Financial resource strain: Not on file  . Food insecurity:    Worry: Not on file    Inability: Not on file  . Transportation needs:    Medical: Not on file    Non-medical: Not on file  Tobacco Use  . Smoking status: Never Smoker  . Smokeless tobacco: Never Used  Substance and Sexual Activity  . Alcohol use: No  . Drug use: No  . Sexual activity: Not on file  Lifestyle  . Physical activity:    Days per week: Not on file    Minutes per session: Not on file  . Stress: Not on file  Relationships  . Social connections:    Talks on phone: Not on file    Gets together: Not on file    Attends religious service: Not on file    Active member of club or organization: Not on file    Attends meetings of clubs or organizations: Not on file    Relationship status: Not on file  Other Topics Concern  . Not on file  Social History Narrative  . Not on file     Family History: The patient's family history includes Heart attack in her brother; Hypertension in her mother; Leukemia in her father; Stroke in her mother. There is no history of Breast cancer. ROS:   Please see the history of present illness.     All other systems reviewed and are negative.  EKGs/Labs/Other Studies Reviewed:    The following studies were reviewed today:  Echocardiogram 12/27/2016 Study  Conclusions - Left ventricle: The cavity size was normal. There was severe   concentric hypertrophy. Systolic function was normal. The   estimated ejection fraction was in the range of 60% to 65%. Wall   motion was normal; there were no regional wall motion   abnormalities. Features are consistent with a pseudonormal  left   ventricular filling pattern, with concomitant abnormal relaxation   and increased filling pressure (grade 2 diastolic dysfunction).   Doppler parameters are consistent with elevated ventricular   end-diastolic filling pressure. - Aortic valve: There was mild stenosis. There was mild   regurgitation. - Mitral valve: Calcified annulus. Mildly thickened leaflets .   There was mild regurgitation. - Left atrium: The atrium was severely dilated. - Right ventricle: The cavity size was normal. Wall thickness was   normal. Systolic function was normal. - Right atrium: The atrium was normal in size. - Tricuspid valve: There was mild regurgitation. - Pulmonary arteries: Systolic pressure was mildly increased. PA   peak pressure: 33 mm Hg (S). - Inferior vena cava: The vessel was normal in size.  EKG:  EKG is ordered today.  The ekg ordered today demonstrates atrial fibrillation, 61 bpm, incomplete RBBB.   Recent Labs: 11/06/2017: BUN 16; Creatinine, Ser 0.68; Hemoglobin 10.9; Platelets 173; Potassium 3.8; Sodium 134   Recent Lipid Panel    Component Value Date/Time   CHOL 185 09/10/2016 0012   TRIG 136 09/10/2016 0012   HDL 43 09/10/2016 0012   CHOLHDL 4.3 09/10/2016 0012   VLDL 27 09/10/2016 0012   LDLCALC 115 (H) 09/10/2016 0012    Physical Exam:    VS:  BP 126/60   Pulse 61   Ht 5\' 2"  (1.575 m)   Wt 154 lb (69.9 kg)   LMP  (LMP Unknown)   BMI 28.17 kg/m     Wt Readings from Last 3 Encounters:  03/10/18 154 lb (69.9 kg)  02/22/18 149 lb 12.8 oz (67.9 kg)  01/02/18 148 lb 9.6 oz (67.4 kg)     Physical Exam  Constitutional: She is oriented to person,  place, and time. She appears well-developed and well-nourished. No distress.  HENT:  Head: Normocephalic and atraumatic.  Neck: Normal range of motion. Neck supple. No JVD present.  Cardiovascular: Normal rate. An irregularly irregular rhythm present.  Murmur heard.  Systolic murmur is present with a grade of 2/6 at the upper left sternal border. Pulmonary/Chest: Effort normal and breath sounds normal. No respiratory distress. She has no wheezes. She has no rales.  Abdominal: Soft. Bowel sounds are normal.  Musculoskeletal: Normal range of motion. She exhibits no edema or deformity.  Neurological: She is alert and oriented to person, place, and time.  Skin: Skin is warm and dry.  Psychiatric: She has a normal mood and affect. Her behavior is normal. Judgment and thought content normal.  Vitals reviewed.    ASSESSMENT:    1. Persistent atrial fibrillation ([place])   2. Chronic diastolic heart failure ([place])   3. Hypertensive heart disease without CHF   4. HOCM (hypertrophic obstructive cardiomyopathy) ([place])   5. Mitral valve insufficiency, unspecified etiology    PLAN:    In order of problems listed above:  Persistent Atrial fibrillation -Was maintaining SR until found to be in afib early this month with c/o DOE.  -On Eliquis for stroke risk reduction with CHA2DS2/VAS Stroke Risk Score of  (CHF, HTN, Age (4), female). She has not missed any doses since 02/22/18.  -started on amiodarone 200 mg bid on 02/22/18.  -Continues to be in afib, rate controlled on amiodarone, with continued DOE. Her DOE is presumed to be related to poorly tolerated afib.  -Plan DCCV after 8/28- 3 weeks of uninterrupted anticoagulation.  -continue amiodarone 200 mg bid. Will reduce after DCCV to 200 mg daily. -if her DOE does  not improve with conversion to sinus rhythm, consider echo and/or stress Myoview.   Chronic diastolic CHF -She has DOE, possibly related to poor tolerance of afib.  -Home wt have been  146-154 range. She weighs daily per her insurance company and reports symptoms daily. She feels that she is very stable with no volume overload.  -Appears euvolemic. Will check BNP, continue lasix 3 times per week for now. May need to increase if BNP elevated.   Hypertension -Well controlled.   HOCM -Echo 08/2016 showed LVOT of 94mg  HG. Subsequent echo 12/2016 have not shown that degree of dynamic obstruction. May need repeat echo if no improvement.   Mitral regurg -Mild by most recent echo 12/27/2016     Medication Adjustments/Labs and Tests Ordered: Current medicines are reviewed at length with the patient today.  Concerns regarding medicines are outlined above. Labs and tests ordered and medication changes are outlined in the patient instructions below:  Patient Instructions  Medication Instructions: Your physician recommends that you continue on your current medications as directed. Please refer to the Current Medication list given to you today.   Labwork: TODAY: BNP  FUTURE: BMET CBC ( for cardioversion)   Procedures/Testing: Dear Cornell Barman You are scheduled for a TEE/Cardioversion/TEE Cardioversion on 03/21/18 with Dr. Acie Fredrickson.  Please arrive at the [facility] (Main Entrance A) at [facility]: [address] [place], [place] [address] at 12:00 PM  DIET: Nothing to eat or drink after midnight except a sip of water with medications (see medication instructions below)  Medication Instructions: Hold Your lasix the morning of your procedure  Continue your anticoagulant: Eliquis  You will need to continue your anticoagulant after your procedure until you  are told by your  Provider that it is safe to stop   Labs: If patient is on Coumadin, patient needs pt/INR, CBC, BMET within 3 days (No pt/INR needed for patients taking Xarelto, Eliquis, Pradaxa) For patients receiving anesthesia for TEE and all Cardioversion patients: BMET, CBC within 1 week  Come to: [place] to the lab at 03/17/18 [place] between the hours of 8:00 am and 5:00 pm. You do not have to be fasting.   You must have a responsible person to drive you home and stay in the waiting area during your procedure. Failure to do so could result in cancellation.  Bring your insurance cards.  *Special Note: Every effort is made to have your procedure done on time. Occasionally there are emergencies that occur at the hospital that may cause delays. Please be patient if a delay does occur.    Follow-Up: Follow up with Dr. Acie Fredrickson 2 weeks after your cardioversion   Any Additional Special Instructions Will Be Listed Below (If Applicable).     If you need a refill on your cardiac medications before your next appointment, please call your pharmacy.      Signed, Daune Perch, NP  03/10/2018 5:42 PM    [facility]

## 2018-03-10 NOTE — Patient Instructions (Addendum)
Medication Instructions: Your physician recommends that you continue on your current medications as directed. Please refer to the Current Medication list given to you today.   Labwork: TODAY: BNP  FUTURE: BMET CBC ( for cardioversion)   Procedures/Testing: Dear Stacy [name] You are scheduled for a TEE/Cardioversion/TEE Cardioversion on 03/21/18 with Dr. Acie Fredrickson.  Please arrive at the [facility] (Main Entrance A) at [facility]: [address] [place], [place] [address] at 12:00 PM  DIET: Nothing to eat or drink after midnight except a sip of water with medications (see medication instructions below)  Medication Instructions: Hold Your lasix the morning of your procedure  Continue your anticoagulant: Eliquis  You will need to continue your anticoagulant after your procedure until you  are told by your  Provider that it is safe to stop   Labs: If patient is on Coumadin, patient needs pt/INR, CBC, BMET within 3 days (No pt/INR needed for patients taking Xarelto, Eliquis, Pradaxa) For patients receiving anesthesia for TEE and all Cardioversion patients: BMET, CBC within 1 week  Come to: [place] to the lab at 03/17/18 [place] between the hours of 8:00 am and 5:00 pm. You do not have to be fasting.   You must have a responsible person to drive you home and stay in the waiting area during your procedure. Failure to do so could result in cancellation.  Bring your insurance cards.  *Special Note: Every effort is made to have your procedure done on time. Occasionally there are emergencies that occur at the hospital that may cause delays. Please be patient if a delay does occur.    Follow-Up: Follow up with Dr. Acie Fredrickson 2 weeks after your cardioversion   Any Additional Special Instructions Will Be Listed Below (If Applicable).     If you need a refill on your cardiac medications before your next appointment, please call your pharmacy.

## 2018-03-10 NOTE — Progress Notes (Signed)
Cardiology Office Note:    Date:  03/10/2018   ID:  Stacy [name], DOB 02/18/1929, MRN [id number]  PCP:  Perini, Mark, MD  Cardiologist:  Philip Nahser, MD  Referring MD: Perini, Mark, MD   Chief Complaint  Patient presents with  . Follow-up    atrial fibrillation    History of Present Illness:    Stacy [name] is a 84 y.o. female with a past medical history significant for chronic diastolic CHF, PAF on eliquis, HOCM, HTN, HLD  and Mitral regurgitation. She saw Vin Bhagat, PA on 02/22/18 at which time she was complaining of new DOE over the priro 2-3 weeks without chest tightness. She was noted to be in atypical atrial flutter with variable rate. She had missed a dose of Eliquis recently and so could not undergo DCCV at that time. She was started on an amiodarone load with 200 mg BID with plan for DCCV in 3 weeks if still in afib.   Today she is here for follow up of afib. She is still short of breath with activity like walking in from the parking deck. Prior to this she was very active with no exertional symptoms. She has an intermittent dull ache in the center of her chest that lasts for only a few seconds, not related to activity, once or twice a week. She denies orthopnea, PND, edema or palpitations. She has had occ lightheadedness but no syncope. She did have a fall in June after begin lightheadedness.  She has a history of dizziness for years and takes meclizine as needed.     Past Medical History:  Diagnosis Date  . Bowel obstruction (HCC)   . Diastolic dysfunction   . Gallbladder disease   . GERD (gastroesophageal reflux disease)   . HOCM (hypertrophic obstructive cardiomyopathy) (HCC) 09/27/2016   Echo 10/16: severe LVH, EF 55-60, peak 32, no RWMA, +SAM, mild to mod MR, severe LAE, PASP 28 // Echo 2/18: mod conc LVH, severe septal LVH c/w HCM, EF 60-65, peak LVOT 94, no RWMA, + SAM, severe MR, severe LAE, PASP 25 // Echo 6/18: severe conc LVH, EF 60-65, noRWMA, Gr 2 DD,  peak LVOT gradient 20, mild aortic stenosis, mild AI, mild MR, severe LAE, mild TR, PASP 33   . Hypertension   . LVH (left ventricular hypertrophy)    WITH ASYMMETRIC SEPTAL HYPERTROPHY  . Mitral regurgitation 09/09/2016   Severe by echo 08/2016  . Persistent atrial fibrillation (HCC) 09/09/2016  . Rheumatoid arthritis (HCC)     Past Surgical History:  Procedure Laterality Date  . CARDIOVASCULAR STRESS TEST  03/10/2006   EF 69%. NO EVIDENCE OF ISCHEMIA  . CHOLECYSTECTOMY    . TOTAL HIP ARTHROPLASTY    . US ECHOCARDIOGRAPHY  02/21/2008   EF 55-60%  . US ECHOCARDIOGRAPHY  07/21/2005   EF 55-60%    Current Medications: Current Meds  Medication Sig  . amiodarone (PACERONE) 200 MG tablet Take 1 tablet (200 mg total) by mouth 2 (two) times daily.  . apixaban (ELIQUIS) 5 MG TABS tablet Take 1 tablet (5 mg total) by mouth 2 (two) times daily.  . diltiazem (CARDIZEM CD) 180 MG 24 hr capsule Take 1 capsule (180 mg total) by mouth daily.  . escitalopram (LEXAPRO) 5 MG tablet Take 5 mg by mouth every morning.  . folic acid (FOLVITE) 1 MG tablet Take 3 mg by mouth daily.   . furosemide (LASIX) 20 MG tablet Take 1 tablet (20 mg total) by mouth   3 (three) times a week.  . gabapentin (NEURONTIN) 100 MG capsule Take 100 mg by mouth daily.  . irbesartan (AVAPRO) 150 MG tablet Take 150 mg by mouth daily.   . meclizine (ANTIVERT) 25 MG tablet Take 25 mg by mouth daily as needed. As needed for dizziness  . methotrexate (RHEUMATREX) 2.5 MG tablet Take 15 mg by mouth every Saturday. Caution:Chemotherapy. Protect from light.   . metoprolol tartrate (LOPRESSOR) 25 MG tablet TAKE 2 TABLETS BY MOUTH IN THE MORNING AND 1 TABLET  BY MOUTH AT BEDTIME  . omeprazole (PRILOSEC) 20 MG capsule Take 20 mg by mouth daily.   . potassium chloride (K-DUR) 10 MEQ tablet Take 1 tablet (10 mEq total) by mouth 3 (three) times a week.  . PROAIR RESPICLICK 108 (90 Base) MCG/ACT AEPB Inhale 5-6 puffs into the lungs daily.   .  ranitidine (ZANTAC) 150 MG tablet Take 150 mg by mouth every evening.   . rOPINIRole (REQUIP) 1 MG tablet Take 1 mg by mouth at bedtime.   . traMADol (ULTRAM) 50 MG tablet Take 50 mg by mouth daily as needed. As needed for moderate pain  . triamcinolone cream (KENALOG) 0.1 % Apply 1 application topically 2 (two) times daily.      Allergies:   Sulfa antibiotics   Social History   Socioeconomic History  . Marital status: Married    Spouse name: Not on file  . Number of children: Not on file  . Years of education: Not on file  . Highest education level: Not on file  Occupational History  . Not on file  Social Needs  . Financial resource strain: Not on file  . Food insecurity:    Worry: Not on file    Inability: Not on file  . Transportation needs:    Medical: Not on file    Non-medical: Not on file  Tobacco Use  . Smoking status: Never Smoker  . Smokeless tobacco: Never Used  Substance and Sexual Activity  . Alcohol use: No  . Drug use: No  . Sexual activity: Not on file  Lifestyle  . Physical activity:    Days per week: Not on file    Minutes per session: Not on file  . Stress: Not on file  Relationships  . Social connections:    Talks on phone: Not on file    Gets together: Not on file    Attends religious service: Not on file    Active member of club or organization: Not on file    Attends meetings of clubs or organizations: Not on file    Relationship status: Not on file  Other Topics Concern  . Not on file  Social History Narrative  . Not on file     Family History: The patient's family history includes Heart attack in her brother; Hypertension in her mother; Leukemia in her father; Stroke in her mother. There is no history of Breast cancer. ROS:   Please see the history of present illness.     All other systems reviewed and are negative.  EKGs/Labs/Other Studies Reviewed:    The following studies were reviewed today:  Echocardiogram 12/27/2016 Study  Conclusions - Left ventricle: The cavity size was normal. There was severe   concentric hypertrophy. Systolic function was normal. The   estimated ejection fraction was in the range of 60% to 65%. Wall   motion was normal; there were no regional wall motion   abnormalities. Features are consistent with a pseudonormal   left   ventricular filling pattern, with concomitant abnormal relaxation   and increased filling pressure (grade 2 diastolic dysfunction).   Doppler parameters are consistent with elevated ventricular   end-diastolic filling pressure. - Aortic valve: There was mild stenosis. There was mild   regurgitation. - Mitral valve: Calcified annulus. Mildly thickened leaflets .   There was mild regurgitation. - Left atrium: The atrium was severely dilated. - Right ventricle: The cavity size was normal. Wall thickness was   normal. Systolic function was normal. - Right atrium: The atrium was normal in size. - Tricuspid valve: There was mild regurgitation. - Pulmonary arteries: Systolic pressure was mildly increased. PA   peak pressure: 33 mm Hg (S). - Inferior vena cava: The vessel was normal in size.  EKG:  EKG is ordered today.  The ekg ordered today demonstrates atrial fibrillation, 61 bpm, incomplete RBBB.   Recent Labs: 11/06/2017: BUN 16; Creatinine, Ser 0.68; Hemoglobin 10.9; Platelets 173; Potassium 3.8; Sodium 134   Recent Lipid Panel    Component Value Date/Time   CHOL 185 09/10/2016 0012   TRIG 136 09/10/2016 0012   HDL 43 09/10/2016 0012   CHOLHDL 4.3 09/10/2016 0012   VLDL 27 09/10/2016 0012   LDLCALC 115 (H) 09/10/2016 0012    Physical Exam:    VS:  BP 126/60   Pulse 61   Ht 5' 2" (1.575 m)   Wt 154 lb (69.9 kg)   LMP  (LMP Unknown)   BMI 28.17 kg/m     Wt Readings from Last 3 Encounters:  03/10/18 154 lb (69.9 kg)  02/22/18 149 lb 12.8 oz (67.9 kg)  01/02/18 148 lb 9.6 oz (67.4 kg)     Physical Exam  Constitutional: She is oriented to person,  place, and time. She appears well-developed and well-nourished. No distress.  HENT:  Head: Normocephalic and atraumatic.  Neck: Normal range of motion. Neck supple. No JVD present.  Cardiovascular: Normal rate. An irregularly irregular rhythm present.  Murmur heard.  Systolic murmur is present with a grade of 2/6 at the upper left sternal border. Pulmonary/Chest: Effort normal and breath sounds normal. No respiratory distress. She has no wheezes. She has no rales.  Abdominal: Soft. Bowel sounds are normal.  Musculoskeletal: Normal range of motion. She exhibits no edema or deformity.  Neurological: She is alert and oriented to person, place, and time.  Skin: Skin is warm and dry.  Psychiatric: She has a normal mood and affect. Her behavior is normal. Judgment and thought content normal.  Vitals reviewed.    ASSESSMENT:    1. Persistent atrial fibrillation (HCC)   2. Chronic diastolic heart failure (HCC)   3. Hypertensive heart disease without CHF   4. HOCM (hypertrophic obstructive cardiomyopathy) (HCC)   5. Mitral valve insufficiency, unspecified etiology    PLAN:    In order of problems listed above:  Persistent Atrial fibrillation -Was maintaining SR until found to be in afib early this month with c/o DOE.  -On Eliquis for stroke risk reduction with CHA2DS2/VAS Stroke Risk Score of  (CHF, HTN, Age (4), female). She has not missed any doses since 02/22/18.  -started on amiodarone 200 mg bid on 02/22/18.  -Continues to be in afib, rate controlled on amiodarone, with continued DOE. Her DOE is presumed to be related to poorly tolerated afib.  -Plan DCCV after 8/28- 3 weeks of uninterrupted anticoagulation.  -continue amiodarone 200 mg bid. Will reduce after DCCV to 200 mg daily. -if her DOE does   not improve with conversion to sinus rhythm, consider echo and/or stress Myoview.   Chronic diastolic CHF -She has DOE, possibly related to poor tolerance of afib.  -Home wt have been  146-154 range. She weighs daily per her insurance company and reports symptoms daily. She feels that she is very stable with no volume overload.  -Appears euvolemic. Will check BNP, continue lasix 3 times per week for now. May need to increase if BNP elevated.   Hypertension -Well controlled.   HOCM -Echo 08/2016 showed LVOT of 94mg HG. Subsequent echo 12/2016 have not shown that degree of dynamic obstruction. May need repeat echo if no improvement.   Mitral regurg -Mild by most recent echo 12/27/2016     Medication Adjustments/Labs and Tests Ordered: Current medicines are reviewed at length with the patient today.  Concerns regarding medicines are outlined above. Labs and tests ordered and medication changes are outlined in the patient instructions below:  Patient Instructions  Medication Instructions: Your physician recommends that you continue on your current medications as directed. Please refer to the Current Medication list given to you today.   Labwork: TODAY: BNP  FUTURE: BMET CBC ( for cardioversion)   Procedures/Testing: Dear Lynett Daino You are scheduled for a TEE/Cardioversion/TEE Cardioversion on 03/21/18 with Dr. Nahser.  Please arrive at the [facility] (Main Entrance A) at [facility]: [address] [place], [place] [address] at 12:00 PM  DIET: Nothing to eat or drink after midnight except a sip of water with medications (see medication instructions below)  Medication Instructions: Hold Your lasix the morning of your procedure  Continue your anticoagulant: Eliquis  You will need to continue your anticoagulant after your procedure until you  are told by your  Provider that it is safe to stop   Labs: If patient is on Coumadin, patient needs pt/INR, CBC, BMET within 3 days (No pt/INR needed for patients taking Xarelto, Eliquis, Pradaxa) For patients receiving anesthesia for TEE and all Cardioversion patients: BMET, CBC within 1 week  Come to: [facility]  Come to the lab at 03/17/18 [address] between the hours of 8:00 am and 5:00 pm. You do not have to be fasting.   You must have a responsible person to drive you home and stay in the waiting area during your procedure. Failure to do so could result in cancellation.  Bring your insurance cards.  *Special Note: Every effort is made to have your procedure done on time. Occasionally there are emergencies that occur at the hospital that may cause delays. Please be patient if a delay does occur.    Follow-Up: Follow up with Dr. Nahser 2 weeks after your cardioversion   Any Additional Special Instructions Will Be Listed Below (If Applicable).     If you need a refill on your cardiac medications before your next appointment, please call your pharmacy.      Signed, Shelbee Apgar, NP  03/10/2018 5:42 PM    [facility] 

## 2018-03-11 LAB — PRO B NATRIURETIC PEPTIDE: NT-PRO BNP: 1410 pg/mL — AB (ref 0–738)

## 2018-03-13 ENCOUNTER — Other Ambulatory Visit: Payer: Self-pay | Admitting: Cardiology

## 2018-03-13 ENCOUNTER — Telehealth: Payer: Self-pay

## 2018-03-13 MED ORDER — FUROSEMIDE 20 MG PO TABS: 20.0000 mg | ORAL_TABLET | Freq: Every day | ORAL | 3 refills | Status: DC

## 2018-03-13 NOTE — Telephone Encounter (Signed)
Per Pecolia Ades NP to increase Lasix to 40 mg daily for 3 days then to 20 mg daily and return in a week for bmet, Pt verbalized understanding.

## 2018-03-17 ENCOUNTER — Other Ambulatory Visit: Payer: Medicare Other | Admitting: *Deleted

## 2018-03-17 DIAGNOSIS — I5032 Chronic diastolic (congestive) heart failure: Secondary | ICD-10-CM

## 2018-03-17 DIAGNOSIS — I4819 Other persistent atrial fibrillation: Secondary | ICD-10-CM

## 2018-03-17 DIAGNOSIS — I340 Nonrheumatic mitral (valve) insufficiency: Secondary | ICD-10-CM

## 2018-03-17 DIAGNOSIS — I421 Obstructive hypertrophic cardiomyopathy: Secondary | ICD-10-CM

## 2018-03-17 DIAGNOSIS — I119 Hypertensive heart disease without heart failure: Secondary | ICD-10-CM

## 2018-03-18 LAB — CBC
Hematocrit: 36.6 % (ref 34.0–46.6)
Hemoglobin: 11.5 g/dL (ref 11.1–15.9)
MCH: 31 pg (ref 26.6–33.0)
MCHC: 31.4 g/dL — ABNORMAL LOW (ref 31.5–35.7)
MCV: 99 fL — ABNORMAL HIGH (ref 79–97)
Platelets: 237 x10E3/uL (ref 150–450)
RBC: 3.71 x10E6/uL — ABNORMAL LOW (ref 3.77–5.28)
RDW: 14.5 % (ref 12.3–15.4)
WBC: 7.6 x10E3/uL (ref 3.4–10.8)

## 2018-03-18 LAB — BASIC METABOLIC PANEL
BUN / CREAT RATIO: 23 (ref 12–28)
BUN: 19 mg/dL (ref 8–27)
CO2: 27 mmol/L (ref 20–29)
CREATININE: 0.82 mg/dL (ref 0.57–1.00)
Calcium: 9.1 mg/dL (ref 8.7–10.3)
Chloride: 99 mmol/L (ref 96–106)
GFR calc Af Amer: 74 mL/min/{1.73_m2} (ref >59)
GFR, EST NON AFRICAN AMERICAN: 65 mL/min/{1.73_m2} (ref >59)
Glucose: 91 mg/dL (ref 65–99)
Potassium: 3.8 mmol/L (ref 3.5–5.2)
SODIUM: 141 mmol/L (ref 134–144)

## 2018-03-21 ENCOUNTER — Encounter (HOSPITAL_COMMUNITY): Payer: Self-pay | Admitting: Cardiovascular Disease

## 2018-03-21 ENCOUNTER — Encounter (HOSPITAL_COMMUNITY): Payer: Self-pay | Admitting: Anesthesiology

## 2018-03-21 ENCOUNTER — Ambulatory Visit (HOSPITAL_COMMUNITY)
Admission: RE | Admit: 2018-03-21 | Discharge: 2018-03-21 | Disposition: A | Discharge status: Home / Self Care | Payer: Medicare Other | Source: Ambulatory Visit | Attending: Cardiovascular Disease | Admitting: Cardiovascular Disease

## 2018-03-21 ENCOUNTER — Encounter (HOSPITAL_COMMUNITY)
Admission: RE | Disposition: A | Discharge status: Home / Self Care | Payer: Self-pay | Source: Ambulatory Visit | Attending: Cardiovascular Disease

## 2018-03-21 DIAGNOSIS — Z882 Allergy status to sulfonamides status: Secondary | ICD-10-CM | POA: Diagnosis not present

## 2018-03-21 DIAGNOSIS — Z7901 Long term (current) use of anticoagulants: Secondary | ICD-10-CM | POA: Diagnosis not present

## 2018-03-21 DIAGNOSIS — I421 Obstructive hypertrophic cardiomyopathy: Secondary | ICD-10-CM | POA: Diagnosis not present

## 2018-03-21 DIAGNOSIS — I5032 Chronic diastolic (congestive) heart failure: Secondary | ICD-10-CM | POA: Diagnosis not present

## 2018-03-21 DIAGNOSIS — I481 Persistent atrial fibrillation: Secondary | ICD-10-CM | POA: Insufficient documentation

## 2018-03-21 DIAGNOSIS — E785 Hyperlipidemia, unspecified: Secondary | ICD-10-CM | POA: Diagnosis not present

## 2018-03-21 DIAGNOSIS — I340 Nonrheumatic mitral (valve) insufficiency: Secondary | ICD-10-CM | POA: Insufficient documentation

## 2018-03-21 DIAGNOSIS — M069 Rheumatoid arthritis, unspecified: Secondary | ICD-10-CM | POA: Diagnosis not present

## 2018-03-21 DIAGNOSIS — K219 Gastro-esophageal reflux disease without esophagitis: Secondary | ICD-10-CM | POA: Insufficient documentation

## 2018-03-21 DIAGNOSIS — I110 Hypertensive heart disease with heart failure: Secondary | ICD-10-CM | POA: Insufficient documentation

## 2018-03-21 DIAGNOSIS — I484 Atypical atrial flutter: Secondary | ICD-10-CM | POA: Diagnosis not present

## 2018-03-21 HISTORY — PX: CARDIOVERSION: SHX1299

## 2018-03-21 SURGERY — CARDIOVERSION
Anesthesia: General

## 2018-03-21 MED ORDER — AMIODARONE HCL 200 MG PO TABS: 200.0000 mg | ORAL_TABLET | Freq: Every day | ORAL | 3 refills | Status: DC

## 2018-03-21 MED ORDER — METOPROLOL TARTRATE 25 MG PO TABS: 25.0000 mg | ORAL_TABLET | Freq: Two times a day (BID) | ORAL | 11 refills | Status: DC

## 2018-03-21 MED ORDER — FUROSEMIDE 20 MG PO TABS: 20.0000 mg | ORAL_TABLET | ORAL | 3 refills | Status: DC

## 2018-03-21 MED ORDER — POTASSIUM CHLORIDE ER 10 MEQ PO TBCR: 10.0000 meq | EXTENDED_RELEASE_TABLET | ORAL | 3 refills | Status: DC

## 2018-03-21 NOTE — Interval H&P Note (Signed)
History and Physical Interval Note:  03/21/2018 11:05 AM  Stacy [name]  has presented today for surgery, with the diagnosis of atrial fibrillation  The various methods of treatment have been discussed with the patient and family. After consideration of risks, benefits and other options for treatment, the patient has consented to  Procedure(s): CARDIOVERSION (N/A) as a surgical intervention .  The patient's history has been reviewed, patient examined, no change in status, stable for surgery.  I have reviewed the patient's chart and labs.  Questions were answered to the patient's satisfaction.     [name]

## 2018-03-21 NOTE — Anesthesia Preprocedure Evaluation (Deleted)
Anesthesia Evaluation  Patient identified by MRN, date of birth, ID band Patient awake    Reviewed: Allergy & Precautions, NPO status , Patient's Chart, lab work & pertinent test results  Airway        Dental   Pulmonary neg pulmonary ROS,           Cardiovascular Exercise Tolerance: Good hypertension,      Neuro/Psych negative neurological ROS  negative psych ROS   GI/Hepatic Neg liver ROS, GERD  ,  Endo/Other  negative endocrine ROS  Renal/GU negative Renal ROS  negative genitourinary   Musculoskeletal   Abdominal   Peds  Hematology negative hematology ROS (+)   Anesthesia Other Findings   Reproductive/Obstetrics                             Anesthesia Physical Anesthesia Plan  ASA: III  Anesthesia Plan: General   Post-op Pain Management:    Induction:   PONV Risk Score and Plan: Treatment may vary due to age or medical condition  Airway Management Planned: Nasal Cannula and Natural Airway  Additional Equipment:   Intra-op Plan:   Post-operative Plan:   Informed Consent: I have reviewed the patients History and Physical, chart, labs and discussed the procedure including the risks, benefits and alternatives for the proposed anesthesia with the patient or authorized representative who has indicated his/her understanding and acceptance.   Dental advisory given  Plan Discussed with: CRNA  Anesthesia Plan Comments:         Anesthesia Quick Evaluation

## 2018-03-21 NOTE — CV Procedure (Signed)
Pt was scheduled for cardioversion but she was in NSR when she presented to the endoscopy suite.  Will reduce the amiodarone to 200 mg a day   Her lasix and Kdur had been increased while she was in Afib Will decrease the Lasix and Kdur back to her previous dose ( 3 times a week )    Return to see an APP in 3-4 weeks    [name], MD  03/21/2018 11:31 AM    [facility] [address],  [place], [place]  [address] Pager [phone] Phone: [phone]; Fax: [phone]

## 2018-04-03 ENCOUNTER — Ambulatory Visit: Payer: Medicare Other | Admitting: Cardiovascular Disease

## 2018-04-03 ENCOUNTER — Encounter: Payer: Self-pay | Admitting: Cardiovascular Disease

## 2018-04-03 ENCOUNTER — Telehealth: Payer: Self-pay | Admitting: Cardiovascular Disease

## 2018-04-03 VITALS — BP 140/64 | HR 54 | Ht 62.0 in | Wt 155.0 lb

## 2018-04-03 DIAGNOSIS — I5032 Chronic diastolic (congestive) heart failure: Secondary | ICD-10-CM | POA: Diagnosis not present

## 2018-04-03 NOTE — Patient Instructions (Signed)
Medication Instructions:  Your physician recommends that you continue on your current medications as directed. Please refer to the Current Medication list given to you today.   Labwork: None Ordered   Testing/Procedures: None Ordered   Follow-Up: Your physician wants you to follow-up in: 6 months with Dr. Nahser.  You will receive a reminder letter in the mail two months in advance. If you don't receive a letter, please call our office to schedule the follow-up appointment.   If you need a refill on your cardiac medications before your next appointment, please call your pharmacy.   Thank you for choosing [facility]! Nuala Chiles, RN [phone]    

## 2018-04-03 NOTE — Progress Notes (Signed)
Stacy [name] Date of Birth  31-Jul-1928 [place] [address]    [place], [place]  [address]    [place],   [address] [phone]  Fax  [phone]  [phone]  Fax [phone]  Problems: 1. 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 Hyperlipidemia 3. Mitral regurgitation 4. Paroxysmal atrial fib (diagnosed Feb. 2018 )  CHads2VASc 5 ( female  age 84, HTN, diastolic CHF)     previous notes   Stacy [name] is done very well from a cardiac standpoint. She's not had any episodes of chest pain or shortness of breath. She's had a cough and bronchitis for the past several weeks. She denies any syncope or presyncope. She denies any chest pain.  Her blood pressure has been well-controlled on. She's not had any problems.   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  April 10 , 2014:  No chest pain.  No dyspnea.  BP has been ok.  She gets some exercise - not as much as she should - also has orthopedic issues that limit her.   Sept. 15, 2014:  She has a mild dynamic  LVOT obstruction.   We increased her metoprolol at the last visit and she is doing well.  No dizziness.  She does have some vertigo and takes meclizine.  Sept. 17, 2015: Doing well.  No CP or dyspnea.   Exercising regularly.  Sept. 23, 2016:  Doing well from a cardiac standpoint Had an episode of vertigo a week ago .  Lasted for several minutes.  Did not go to the ER.  Symptoms have resolved completely   October 14, 2015: Stacy [name] has a dynamic LVOT obstruction that we are managing with metoprolol.   Her HR has been a bit slow She is off the  HCTZ .   i've advised her to avoid being volume depleted.  Has had some dizziness and headaches recently  Taking metoprolol 50 mg in the am , 25 mg in the evening   January 03, 2017:  Stacy [name] is seen today for follow up visit She was found to have  Atrial fib with RVR ( FEb. 21-23 hospitalization) and has been started on Eliquis  She has converted back to NSR / sinus bradycardia  Echo shows normal LV systolic function, grade 2 diastolic dysfunction, mild AS   Has not been taking her Eliquis - having occasional bloody nose. Has some bleeding under her right great toe.  BP is typically better at home   September 26, 2017   Stacy [name] is an 84 year old female with a history of paroxysmal atrial fibrillation, hypertrophic obstructive cardiomyopathy, mitral regurgitation, hyperlipidemia and diastolic heart failure.  She was seen in October, 2018 by Richardson Dopp, PA.  Avoids salt.  Has some dyspnea.  Able to work out regularly .   Has some DOE with exercise  Has not taken her meds - BP is a bit elevated today  She was on Lasix and kdur.   These were stopped by Dr. Joylene Draft  -possibly because of her dynamic outflow tract obstruction. Has some leg swelling    January 02, 2018:  Stacy [name] is seen back today for follow-up of her chronic diastolic congestive heart failure and her LVOT obstruction.  We added Lasix 20 mg 3 times a week as well as potassium chloride 10 mEq 3 times a week. She  is feeling much better at this point.   No syncope . Getting regular exercise   April 03, 2018:  Stacy [name] is seen back today for follow-up visit.  She has had some volume overload.  She has normal left ventricular systolic function.  In the past we have been concerned about dynamic LVOT obstruction but recent echocardiograms have not demonstrated a significant LVOT obstruction.  She feels fine.  She is part of a program that has home health nurses monitor   her weight.  She recently gained several pounds in 1 day and we  were alerted.  She takes Lasix 20 mg 3 days a week but notes that she really does not get much urine output today after taking the Lasix 20 mg tablets. She is not short of breath.  She has not had any leg edema.  She really does not feel any  different today compared to last week with a week before.  Wt Readings from Last 3 Encounters:  04/03/18 155 lb (70.3 kg)  03/10/18 154 lb (69.9 kg)  02/22/18 149 lb 12.8 oz (67.9 kg)    Current Outpatient Medications on File Prior to Visit  Medication Sig Dispense Refill  . amiodarone (PACERONE) 200 MG tablet Take 1 tablet (200 mg total) by mouth daily. 90 tablet 3  . apixaban (ELIQUIS) 5 MG TABS tablet Take 1 tablet (5 mg total) by mouth 2 (two) times daily. 60 tablet 0  . diltiazem (CARDIZEM CD) 180 MG 24 hr capsule Take 1 capsule (180 mg total) by mouth daily. 30 capsule 0  . folic acid (FOLVITE) 1 MG tablet Take 3 mg by mouth daily.     . furosemide (LASIX) 20 MG tablet Take 1 tablet (20 mg total) by mouth 3 (three) times a week. 90 tablet 3  . irbesartan (AVAPRO) 150 MG tablet Take 150 mg by mouth daily.     . meclizine (ANTIVERT) 25 MG tablet Take 25 mg by mouth daily as needed for dizziness.     . methotrexate (RHEUMATREX) 2.5 MG tablet Take 15 mg by mouth every Saturday.     . metoprolol tartrate (LOPRESSOR) 25 MG tablet Take 1 tablet (25 mg total) by mouth 2 (two) times daily. . 60 tablet 11  . omeprazole (PRILOSEC) 20 MG capsule Take 20 mg by mouth every morning.     . potassium chloride (K-DUR) 10 MEQ tablet Take 1 tablet (10 mEq total) by mouth 3 (three) times a week. 45 tablet 3  . PROAIR RESPICLICK 161 (90 Base) MCG/ACT AEPB Inhale 2 puffs into the lungs every 6 (six) hours.     . ranitidine (ZANTAC) 150 MG tablet Take 150 mg by mouth every evening.     Marland Kitchen rOPINIRole (REQUIP) 1 MG tablet Take 1 mg by mouth at bedtime.     . traMADol (ULTRAM) 50 MG tablet Take 50 mg by mouth daily as needed for severe pain.      No current facility-administered medications on file prior to visit.     Allergies  Allergen Reactions  . Sulfa Antibiotics Nausea Only    Past Medical History:  Diagnosis Date  . Bowel obstruction ([facility])   . Diastolic dysfunction   . Gallbladder disease   .  GERD (gastroesophageal reflux disease)   . HOCM (hypertrophic obstructive cardiomyopathy) ([place]) 09/27/2016   Echo 10/16: severe LVH, EF 55-60, peak 32, no RWMA, +SAM, mild to mod MR, severe LAE, PASP 28 // Echo 2/18: mod conc LVH, severe septal  LVH c/w HCM, EF 60-65, peak LVOT 94, no RWMA, + SAM, severe MR, severe LAE, PASP 25 // Echo 6/18: severe conc LVH, EF 60-65, noRWMA, Gr 2 DD, peak LVOT gradient 20, mild aortic stenosis, mild AI, mild MR, severe LAE, mild TR, PASP 33   . Hypertension   . LVH (left ventricular hypertrophy)    WITH ASYMMETRIC SEPTAL HYPERTROPHY  . Mitral regurgitation 09/09/2016   Severe by echo 08/2016  . Persistent atrial fibrillation ([place]) 09/09/2016  . Rheumatoid arthritis [facility])     Past Surgical History:  Procedure Laterality Date  . CARDIOVASCULAR STRESS TEST  03/10/2006   EF 69%. NO EVIDENCE OF ISCHEMIA  . CARDIOVERSION N/A 03/21/2018   Procedure: Cancelled Cardioversion;  Surgeon: Thayer Headings, MD;  Location: [facility];  Service: Cardiovascular;  Laterality: N/A;  . CHOLECYSTECTOMY    . TOTAL HIP ARTHROPLASTY    . US ECHOCARDIOGRAPHY  02/21/2008   EF 55-60%  . US ECHOCARDIOGRAPHY  07/21/2005   EF 55-60%    Social History   Tobacco Use  Smoking Status Never Smoker  Smokeless Tobacco Never Used    Social History   Substance and Sexual Activity  Alcohol Use No    Family History  Problem Relation Age of Onset  . Leukemia Father   . Heart attack Brother   . Hypertension Mother   . Stroke Mother   . Breast cancer Neg Hx     Reviw of Systems:  Reviewed in the HPI.  All other systems are negative.   Physical Exam: Blood pressure 140/64, pulse (!) 54, height 5\' 2"  (1.575 m), weight 155 lb (70.3 kg), SpO2 98 %.  GEN: Elderly female, no acute distress. HEENT: Normal NECK: No JVD; No carotid bruits LYMPHATICS: No lymphadenopathy CARDIAC: Rate S1-S2. RESPIRATORY:  Clear to auscultation without rales, wheezing or rhonchi  ABDOMEN: Soft,  non-tender, non-distended MUSCULOSKELETAL:  No edema; No deformity  SKIN: Warm and dry NEUROLOGIC:  Alert and oriented x 3    ECG:   Assessment / Plan:   1. Paroxysmal atrial fib : CHADS2VASC score 5  ( female, age , HTN, CHF)  She still in normal sinus rhythm.  2.  Chronic diastolic congestive heart failure: She is gained a little bit of weight.  We were alerted by the home health monitoring system because she had gained 3 pounds of the past day.  There is no evidence of pulmonary edema and no leg edema.  She admits that she did not get much response from her Lasix this morning but I do not think that we need to change her medications.  I will see her again in 6 months.  She will call if she develops shortness of breath.   2. Hyperlipidemia  -  Labs are stable    3. Mitral regurgitation-        Stacy Moores, MD  04/03/2018 3:35 PM    [place],  [place], [place]  [address] Pager [phone] Phone: [phone]; Fax: [phone]

## 2018-04-03 NOTE — Telephone Encounter (Signed)
New Message          Pt c/o swelling: STAT is pt has developed SOB within 24 hours  1) How much weight have you gained and in what time span? 3 pounds in 4 days  2) If swelling, where is the swelling located? Knees and feet  3) Are you currently taking a fluid pill? Yes  4) Are you currently SOB? Yes with activity/dry cough and tiredness   5) Do you have a log of your daily weights (if so, list)? yes  6) Have you gained 3 pounds in a day or 5 pounds in a week? No       7)       Have you traveled recently? No              Christina with [employer] is calling to report swelling

## 2018-04-03 NOTE — Telephone Encounter (Signed)
Left detailed message for Stacy [name] with [facility] that patient was seen by Dr. Acie Fredrickson in the office today and is without symptoms of CHF. I advised her to call back with questions or concerns.

## 2018-04-25 ENCOUNTER — Emergency Department (HOSPITAL_COMMUNITY)
Admission: EM | Admit: 2018-04-25 | Discharge: 2018-04-26 | Disposition: A | Discharge status: Home / Self Care | Payer: Medicare Other | Attending: Emergency Medicine | Admitting: Emergency Medicine

## 2018-04-25 ENCOUNTER — Emergency Department (HOSPITAL_COMMUNITY): Payer: Medicare Other

## 2018-04-25 ENCOUNTER — Encounter (HOSPITAL_COMMUNITY): Payer: Self-pay | Admitting: Emergency Medicine

## 2018-04-25 ENCOUNTER — Other Ambulatory Visit: Payer: Self-pay

## 2018-04-25 DIAGNOSIS — Z96649 Presence of unspecified artificial hip joint: Secondary | ICD-10-CM | POA: Diagnosis not present

## 2018-04-25 DIAGNOSIS — Z7901 Long term (current) use of anticoagulants: Secondary | ICD-10-CM | POA: Insufficient documentation

## 2018-04-25 DIAGNOSIS — I5032 Chronic diastolic (congestive) heart failure: Secondary | ICD-10-CM | POA: Insufficient documentation

## 2018-04-25 DIAGNOSIS — Z79899 Other long term (current) drug therapy: Secondary | ICD-10-CM | POA: Insufficient documentation

## 2018-04-25 DIAGNOSIS — R51 Headache: Secondary | ICD-10-CM | POA: Insufficient documentation

## 2018-04-25 DIAGNOSIS — R519 Headache, unspecified: Secondary | ICD-10-CM

## 2018-04-25 DIAGNOSIS — R2689 Other abnormalities of gait and mobility: Secondary | ICD-10-CM | POA: Diagnosis not present

## 2018-04-25 DIAGNOSIS — I110 Hypertensive heart disease with heart failure: Secondary | ICD-10-CM | POA: Insufficient documentation

## 2018-04-25 LAB — I-STAT TROPONIN, ED: Troponin i, poc: 0 ng/mL (ref 0.00–0.08)

## 2018-04-25 LAB — I-STAT CHEM 8, ED
BUN: 23 mg/dL (ref 8–23)
CHLORIDE: 103 mmol/L (ref 98–111)
CREATININE: 0.8 mg/dL (ref 0.44–1.00)
Calcium, Ion: 1.24 mmol/L (ref 1.15–1.40)
Glucose, Bld: 90 mg/dL (ref 70–99)
HEMATOCRIT: 39 % (ref 36.0–46.0)
Hemoglobin: 13.3 g/dL (ref 12.0–15.0)
POTASSIUM: 3.6 mmol/L (ref 3.5–5.1)
SODIUM: 140 mmol/L (ref 135–145)
TCO2: 29 mmol/L (ref 22–32)

## 2018-04-25 LAB — COMPREHENSIVE METABOLIC PANEL
ALT: 17 U/L (ref 0–44)
ANION GAP: 9 (ref 5–15)
AST: 24 U/L (ref 15–41)
Albumin: 3.7 g/dL (ref 3.5–5.0)
Alkaline Phosphatase: 73 U/L (ref 38–126)
BILIRUBIN TOTAL: 0.7 mg/dL (ref 0.3–1.2)
BUN: 18 mg/dL (ref 8–23)
CO2: 26 mmol/L (ref 22–32)
Calcium: 9.5 mg/dL (ref 8.9–10.3)
Chloride: 104 mmol/L (ref 98–111)
Creatinine, Ser: 0.79 mg/dL (ref 0.44–1.00)
GFR calc Af Amer: >60 mL/min (ref >60)
GFR calc non Af Amer: >60 mL/min (ref >60)
GLUCOSE: 92 mg/dL (ref 70–99)
POTASSIUM: 3.6 mmol/L (ref 3.5–5.1)
SODIUM: 139 mmol/L (ref 135–145)
TOTAL PROTEIN: 6.6 g/dL (ref 6.5–8.1)

## 2018-04-25 LAB — DIFFERENTIAL
ABS IMMATURE GRANULOCYTES: 0.01 10*3/uL (ref 0.00–0.07)
BASOS ABS: 0 10*3/uL (ref 0.0–0.1)
Basophils Relative: 1 %
EOS ABS: 0.3 10*3/uL (ref 0.0–0.5)
Eosinophils Relative: 5 %
Immature Granulocytes: 0 %
LYMPHS ABS: 2.5 10*3/uL (ref 0.7–4.0)
LYMPHS PCT: 42 %
Monocytes Absolute: 0.5 10*3/uL (ref 0.1–1.0)
Monocytes Relative: 8 %
Neutro Abs: 2.7 10*3/uL (ref 1.7–7.7)
Neutrophils Relative %: 44 %

## 2018-04-25 LAB — CBC
HEMATOCRIT: 40.8 % (ref 36.0–46.0)
Hemoglobin: 12.6 g/dL (ref 12.0–15.0)
MCH: 30.2 pg (ref 26.0–34.0)
MCHC: 30.9 g/dL (ref 30.0–36.0)
MCV: 97.8 fL (ref 80.0–100.0)
NRBC: 0 % (ref 0.0–0.2)
PLATELETS: 224 10*3/uL (ref 150–400)
RBC: 4.17 MIL/uL (ref 3.87–5.11)
RDW: 13.9 % (ref 11.5–15.5)
WBC: 6 10*3/uL (ref 4.0–10.5)

## 2018-04-25 LAB — PROTIME-INR
INR: 1.1
PROTHROMBIN TIME: 14.1 s (ref 11.4–15.2)

## 2018-04-25 LAB — APTT: aPTT: 32 seconds (ref 24–36)

## 2018-04-25 NOTE — ED Triage Notes (Addendum)
Pt reports one week ago pt reports jaw pain and discombobulated mouth movements and was biting her tongue and lips, felt like her actions weren't "in sync". Pt reports waking up this morning with a headache and was having balance problems. Denies falling, no injuries, takes Eliquis. Headache 5/10 right now. No other neuro deficits noted.

## 2018-04-25 NOTE — ED Provider Notes (Signed)
[facility] EMERGENCY DEPARTMENT Provider Note   CSN: [id number] Arrival date & time: 04/25/18  1744     History   Chief Complaint Chief Complaint  Patient presents with  . Headache  . Balance Problems    HPI Stacy [name] is a 84 y.o. female.  HPI  Stacy [name] is an 84yo female with a history of atrial fibrillation (on Eliquis), mitral regurgitation, rheumatoid arthritis (on methotrexate), daily headaches, vertigo, hypertension and hyperlipidemia who presents to the emergency department for evaluation of headache and balance problems this morning.  Patient states that she woke up this morning with her usual severe right-sided temporal headache which radiates to the occiput.  She states that when she stood up at 4:30 AM to go to the bathroom she felt off balance and had to catch herself against the cabinet.  By the time she was walking back from the bathroom, she no longer felt off balance.  She did not hit her head, fall to the ground or lose consciousness.  No associated visual disturbance, numbness, weakness, dysarthria.  No room spinning.  She reports that she took Tylenol for the headache, which she usually takes for headaches and her symptoms resolved.  She now denies headache or balance problems, just wants to be checked out.  She also adds that last week she had a headache which gave her some discombobulated mouth movements and was biting her tongue and lips.  She takes ropinirole for restless leg syndrome.  She denies any new medications.  She does not use any assistive device to ambulate at home.  She denies any complaints currently.  She denies fever, neck stiffness, chest pain, palpitations, leg swelling, shortness of breath, abdominal pain, nausea/vomiting, dysuria, urinary frequency, cough, congestion, sore throat, lightheadedness, syncope. No jaw claudication.   Past Medical History:  Diagnosis Date  . Bowel obstruction ([place])   . Diastolic dysfunction    . Gallbladder disease   . GERD (gastroesophageal reflux disease)   . HOCM (hypertrophic obstructive cardiomyopathy) ([place]) 09/27/2016   Echo 10/16: severe LVH, EF 55-60, peak 32, no RWMA, +SAM, mild to mod MR, severe LAE, PASP 28 // Echo 2/18: mod conc LVH, severe septal LVH c/w HCM, EF 60-65, peak LVOT 94, no RWMA, + SAM, severe MR, severe LAE, PASP 25 // Echo 6/18: severe conc LVH, EF 60-65, noRWMA, Gr 2 DD, peak LVOT gradient 20, mild aortic stenosis, mild AI, mild MR, severe LAE, mild TR, PASP 33   . Hypertension   . LVH (left ventricular hypertrophy)    WITH ASYMMETRIC SEPTAL HYPERTROPHY  . Mitral regurgitation 09/09/2016   Severe by echo 08/2016  . Persistent atrial fibrillation 09/09/2016  . Rheumatoid arthritis [facility])     Patient Active Problem List   Diagnosis Date Noted  . Chronic diastolic heart failure ([place]) 05/02/2017  . Spider veins of both lower extremities 01/11/2017  . Essential hypertension 01/03/2017  . HOCM (hypertrophic obstructive cardiomyopathy) ([place]) 09/27/2016  . Persistent atrial fibrillation 09/09/2016  . GERD (gastroesophageal reflux disease) 09/09/2016  . Rheumatoid arthritis ([place]) 09/09/2016  . Mitral regurgitation 09/09/2016  . Elevated troponin 09/09/2016  . Rotator cuff tear 06/28/2011  . Shoulder pain 06/28/2011  . Hypertensive heart disease without CHF 06/02/2011  . LVH (left ventricular hypertrophy) 06/02/2011  . Hyperlipidemia 06/02/2011  . History of total hip arthroplasty 05/27/2011    Past Surgical History:  Procedure Laterality Date  . CARDIOVASCULAR STRESS TEST  03/10/2006   EF 69%. NO EVIDENCE OF ISCHEMIA  .  CARDIOVERSION N/A 03/21/2018   Procedure: Cancelled Cardioversion;  Surgeon: Thayer Headings, MD;  Location: [facility];  Service: Cardiovascular;  Laterality: N/A;  . CHOLECYSTECTOMY    . TOTAL HIP ARTHROPLASTY    . US ECHOCARDIOGRAPHY  02/21/2008   EF 55-60%  . US ECHOCARDIOGRAPHY  07/21/2005   EF 55-60%     OB History   None       Home Medications    Prior to Admission medications   Medication Sig Start Date End Date Taking? Authorizing Provider  amiodarone (PACERONE) 200 MG tablet Take 1 tablet (200 mg total) by mouth daily. 03/21/18   Nahser, Wonda Cheng, MD  apixaban (ELIQUIS) 5 MG TABS tablet Take 1 tablet (5 mg total) by mouth 2 (two) times daily. 09/10/16   Florencia Reasons, MD  diltiazem (CARDIZEM CD) 180 MG 24 hr capsule Take 1 capsule (180 mg total) by mouth daily. 09/11/16   Florencia Reasons, MD  folic acid (FOLVITE) 1 MG tablet Take 3 mg by mouth daily.     [provider]  furosemide (LASIX) 20 MG tablet Take 1 tablet (20 mg total) by mouth 3 (three) times a week. 03/22/18   Nahser, Wonda Cheng, MD  irbesartan (AVAPRO) 150 MG tablet Take 150 mg by mouth daily.  03/23/17   [provider]  meclizine (ANTIVERT) 25 MG tablet Take 25 mg by mouth daily as needed for dizziness.  02/07/15   [provider]  methotrexate (RHEUMATREX) 2.5 MG tablet Take 15 mg by mouth every Saturday.     [provider]  metoprolol tartrate (LOPRESSOR) 25 MG tablet Take 1 tablet (25 mg total) by mouth 2 (two) times daily. . 03/21/18   Nahser, Wonda Cheng, MD  omeprazole (PRILOSEC) 20 MG capsule Take 20 mg by mouth every morning.  03/26/15   [provider]  potassium chloride (K-DUR) 10 MEQ tablet Take 1 tablet (10 mEq total) by mouth 3 (three) times a week. 03/22/18   Nahser, Wonda Cheng, MD  PROAIR RESPICLICK 539 [phone] Base) MCG/ACT AEPB Inhale 2 puffs into the lungs every 6 (six) hours.  04/22/17   [provider]  ranitidine (ZANTAC) 150 MG tablet Take 150 mg by mouth every evening.  04/22/17   [provider]  rOPINIRole (REQUIP) 1 MG tablet Take 1 mg by mouth at bedtime.  03/27/15   [provider]  traMADol (ULTRAM) 50 MG tablet Take 50 mg by mouth daily as needed for severe pain.  02/12/15   [provider]    Family History Family History  Problem Relation Age of Onset  . Leukemia  Father   . Heart attack Brother   . Hypertension Mother   . Stroke Mother   . Breast cancer Neg Hx     Social History Social History   Tobacco Use  . Smoking status: Never Smoker  . Smokeless tobacco: Never Used  Substance Use Topics  . Alcohol use: No  . Drug use: No     Allergies   Sulfa antibiotics   Review of Systems Review of Systems  Constitutional: Negative for chills and fever.  HENT: Negative for congestion and sore throat.   Eyes: Negative for visual disturbance.  Respiratory: Negative for shortness of breath.   Cardiovascular: Negative for chest pain, palpitations and leg swelling.  Gastrointestinal: Negative for abdominal pain, nausea and vomiting.  Genitourinary: Negative for difficulty urinating and dysuria.  Musculoskeletal: Negative for back pain and gait problem.  Skin: Negative for rash.  Neurological: Positive for headaches. Negative for syncope, weakness, light-headedness and numbness.       Felt off balance earlier today  Psychiatric/Behavioral: Negative for agitation.     Physical Exam Updated Vital Signs BP (!) 154/70   Pulse (!) 50   Temp 97.7 F (36.5 C) (Oral)   Resp 16   Ht 5\' 3"  (1.6 m)   Wt 70.3 kg   LMP  (LMP Unknown)   SpO2 100%   BMI 27.46 kg/m   Physical Exam  Constitutional: She is oriented to person, place, and time. She appears well-developed and well-nourished. No distress.  NAD, non-toxic appearing.   HENT:  Head: Normocephalic and atraumatic.  Mouth/Throat: Oropharynx is clear and moist.  No temporal artery bruit or tenderness.   Eyes: Pupils are equal, round, and reactive to light. Conjunctivae and EOM are normal. Right eye exhibits no discharge. Left eye exhibits no discharge.  Neck: Normal range of motion. Neck supple.  Cardiovascular: Normal rate and regular rhythm.  Murmur heard. Pulmonary/Chest: Effort normal and breath sounds normal. No stridor. No respiratory distress. She has no wheezes. She has no rales.   Abdominal: Soft. There is no tenderness.  Neurological: She is alert and oriented to person, place, and time. Coordination normal.  Mental Status:  Alert, oriented, thought content appropriate, able to give a coherent history. Speech fluent without evidence of aphasia. Able to follow 2 step commands without difficulty.  Cranial Nerves:  II:  Peripheral visual fields grossly normal, pupils equal, round, reactive to light III,IV, VI: ptosis not present, extra-ocular motions intact bilaterally  V,VII: smile symmetric, facial light touch sensation equal VIII: hearing grossly normal to voice  X: uvula elevates symmetrically  XI: bilateral shoulder shrug symmetric and strong XII: midline tongue extension without fassiculations Motor:  Normal tone. 5/5 in upper and lower extremities bilaterally including strong and equal grip strength and dorsiflexion/plantar flexion Sensory: Light touch normal in all extremities.  Cerebellar: normal finger-to-nose with bilateral upper extremities. Negative HINTs exam Gait: normal gait and balance, ambulates independently CV: distal pulses palpable throughout   Skin: Skin is warm and dry. She is not diaphoretic.  Psychiatric: She has a normal mood and affect. Her behavior is normal.  Nursing note and vitals reviewed.    ED Treatments / Results  Labs (all labs ordered are listed, but only abnormal results are displayed) Labs Reviewed  PROTIME-INR  APTT  CBC  DIFFERENTIAL  COMPREHENSIVE METABOLIC PANEL  I-STAT TROPONIN, ED  I-STAT CHEM 8, ED  CBG MONITORING, ED    EKG None  Radiology Ct Head Wo Contrast  Result Date: 04/25/2018 CLINICAL DATA:  Headache, ataxia. EXAM: CT HEAD WITHOUT CONTRAST TECHNIQUE: Contiguous axial images were obtained from the base of the skull through the vertex without intravenous contrast. COMPARISON:  CT scan of June 26, 2015. FINDINGS: Brain: Mild diffuse cortical atrophy is noted. No mass effect or midline shift is  noted. Ventricular size is within normal limits. There is no evidence of mass lesion, hemorrhage or acute infarction. Vascular: No hyperdense vessel or unexpected calcification. Skull: Normal. Negative for fracture or focal lesion. Sinuses/Orbits: No acute finding. Other: None. IMPRESSION: Mild diffuse cortical atrophy. No acute intracranial abnormality seen. Electronically Signed   By: Marijo Conception, M.D.   On: 04/25/2018 19:46    Procedures Procedures (including critical care time)  Medications Ordered in ED Medications - No data to display   Initial Impression / Assessment and Plan / ED Course  I have reviewed the  triage vital signs and the nursing notes.  Pertinent labs & imaging results that were available during my care of the patient were reviewed by me and considered in my medical decision making (see chart for details).      84yo female presents with daily headaches and feeling off balance upon standing to go to the bathroom early this morning at 4:30AM. She denies fall, hitting her head or LOC. On Eliquis for afib.  She now reports that she is baseline, no headache or dizziness. On exam, no neurological deficits.  Negative hints exam.  CT head without acute abnormality.  EKG without significant changes from last tracing and troponin negative.  CBC, CMP, CBG within normal limits.   Her symptoms are likely related to orthostatic hypotension in the setting of standing up abruptly in the middle of the night, given resolved shortly after getting up and no room spinning.  She has a normal neurological exam and I doubt acute stroke.  I also doubt TIA given no focal numbness, weakness or visual disturbance at the time of her symptoms which resolved shortly after standing. In terms of her daily headaches, no mass on CT.  I doubt temporal arteritis given no torturous temporal arteries on exam, no temporal artery bruit, no jaw claudication, no fever/chills or constitutional symptoms, no visual  disturbance.  Plan to have patient follow-up with her regular doctor regarding her regular headaches.  I have discussed strict return precautions and she agrees and appears reliable.  This was a shared visit with Dr. Leonette Monarch who also saw the patient and agrees with plan to discharge.    Final Clinical Impressions(s) / ED Diagnoses   Final diagnoses:  Headache disorder  Balance problem    ED Discharge Orders    None       Glyn Ade, PA-C 04/26/18 0148    Fatima Blank, MD 04/26/18 [phone]

## 2018-04-26 NOTE — Discharge Instructions (Addendum)
Follow up with your regular doctor regarding your daily headaches.   Come back to the ER if you have any new or worsening symptoms like worst headache of your life, numbness, weakness, problems with your vision, fever.

## 2018-04-26 NOTE — ED Notes (Signed)
PT states understanding of care given, follow up care. PT ambulated from ED to car with a steady gait.  

## 2018-07-03 ENCOUNTER — Ambulatory Visit: Payer: Medicare Other | Admitting: Cardiovascular Disease

## 2018-07-03 ENCOUNTER — Encounter: Payer: Self-pay | Admitting: Cardiovascular Disease

## 2018-07-03 VITALS — BP 130/60 | HR 55 | Ht 63.0 in | Wt 157.0 lb

## 2018-07-03 DIAGNOSIS — I119 Hypertensive heart disease without heart failure: Secondary | ICD-10-CM | POA: Diagnosis not present

## 2018-07-03 DIAGNOSIS — I480 Paroxysmal atrial fibrillation: Secondary | ICD-10-CM

## 2018-07-03 NOTE — Patient Instructions (Signed)

## 2018-07-03 NOTE — Progress Notes (Signed)
[name] Date of Birth  31-Jul-1928 [place] [address]    [place], [place]  [address]    [place],   [address] [phone]  Fax  [phone]  [phone]  Fax [phone]  Problems: 1. 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 Hyperlipidemia 3. Mitral regurgitation 4. Paroxysmal atrial fib (diagnosed Feb. 2018 )  CHads2VASc 5 ( female  age 84, HTN, diastolic CHF)     previous notes   [name] is done very well from a cardiac standpoint. She's not had any episodes of chest pain or shortness of breath. She's had a cough and bronchitis for the past several weeks. She denies any syncope or presyncope. She denies any chest pain.  Her blood pressure has been well-controlled on. She's not had any problems.   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  April 10 , 2014:  No chest pain.  No dyspnea.  BP has been ok.  She gets some exercise - not as much as she should - also has orthopedic issues that limit her.   Sept. 15, 2014:  She has a mild dynamic  LVOT obstruction.   We increased her metoprolol at the last visit and she is doing well.  No dizziness.  She does have some vertigo and takes meclizine.  Sept. 17, 2015: Doing well.  No CP or dyspnea.   Exercising regularly.  Sept. 23, 2016:  Doing well from a cardiac standpoint Had an episode of vertigo a week ago .  Lasted for several minutes.  Did not go to the ER.  Symptoms have resolved completely   October 14, 2015: [name] has a dynamic LVOT obstruction that we are managing with metoprolol.   Her HR has been a bit slow She is off the  HCTZ .   i've advised her to avoid being volume depleted.  Has had some dizziness and headaches recently  Taking metoprolol 50 mg in the am , 25 mg in the evening   January 03, 2017:  Delorse is seen today for follow up visit She was found to have  Atrial fib with RVR ( FEb. 21-23 hospitalization) and has been started on Eliquis  She has converted back to NSR / sinus bradycardia  Echo shows normal LV systolic function, grade 2 diastolic dysfunction, mild AS   Has not been taking her Eliquis - having occasional bloody nose. Has some bleeding under her right great toe.  BP is typically better at home   September 26, 2017   [name] is an 84 year old female with a history of paroxysmal atrial fibrillation, hypertrophic obstructive cardiomyopathy, mitral regurgitation, hyperlipidemia and diastolic heart failure.  She was seen in October, 2018 by Richardson Dopp, PA.  Avoids salt.  Has some dyspnea.  Able to work out regularly .   Has some DOE with exercise  Has not taken her meds - BP is a bit elevated today  She was on Lasix and kdur.   These were stopped by Dr. Joylene Draft  -possibly because of her dynamic outflow tract obstruction. Has some leg swelling    January 02, 2018:  [name] is seen back today for follow-up of her chronic diastolic congestive heart failure and her LVOT obstruction.  We added Lasix 20 mg 3 times a week as well as potassium chloride 10 mEq 3 times a week. She  is feeling much better at this point.   No syncope . Getting regular exercise   April 03, 2018:  Ms. Mennen is seen back today for follow-up visit.  She has had some volume overload.  She has normal left ventricular systolic function.  In the past we have been concerned about dynamic LVOT obstruction but recent echocardiograms have not demonstrated a significant LVOT obstruction.  She feels fine.  She is part of a program that has home health nurses monitor   her weight.  She recently gained several pounds in 1 day and we  were alerted.  She takes Lasix 20 mg 3 days a week but notes that she really does not get much urine output today after taking the Lasix 20 mg tablets. She is not short of breath.  She has not had any leg edema.  She really does not feel any  different today compared to last week with a week before.  Dec. 16, 2019: Ms. Berzins is seen back today for work in visit.  She was recently seen by her primary medical doctor and was markedly hypertensive. She did not eat any extra salt. He increased the Lasix and Kdur to 5 times a week . Now her BP readings are great.  shes tolerating the increased meds.     Wt Readings from Last 3 Encounters:  07/03/18 157 lb (71.2 kg)  04/25/18 155 lb (70.3 kg)  04/03/18 155 lb (70.3 kg)    Current Outpatient Medications on File Prior to Visit  Medication Sig Dispense Refill  . amiodarone (PACERONE) 200 MG tablet Take 1 tablet (200 mg total) by mouth daily. 90 tablet 3  . apixaban (ELIQUIS) 5 MG TABS tablet Take 1 tablet (5 mg total) by mouth 2 (two) times daily. 60 tablet 0  . diltiazem (CARDIZEM CD) 180 MG 24 hr capsule Take 1 capsule (180 mg total) by mouth daily. 30 capsule 0  . folic acid (FOLVITE) 1 MG tablet Take 3 mg by mouth daily.     . furosemide (LASIX) 20 MG tablet Take 20 mg by mouth as directed. Patient takes 1 tablet 5 days a week; Monday-Friday    . irbesartan (AVAPRO) 150 MG tablet Take 150 mg by mouth daily.     . meclizine (ANTIVERT) 25 MG tablet Take 25 mg by mouth daily as needed for dizziness.     . methotrexate (RHEUMATREX) 2.5 MG tablet Take 15 mg by mouth every Saturday.     . metoprolol tartrate (LOPRESSOR) 25 MG tablet Take 1 tablet (25 mg total) by mouth 2 (two) times daily. . 60 tablet 11  . omeprazole (PRILOSEC) 20 MG capsule Take 20 mg by mouth daily.     . potassium chloride (K-DUR) 10 MEQ tablet Take 10 mEq by mouth as directed. Patient takes 1 tablet 5 times a week; Monday-Friday with Lasix    . PROAIR RESPICLICK 063 (90 Base) MCG/ACT AEPB Inhale 2 puffs into the lungs every 6 (six) hours as needed.     . ranitidine (ZANTAC) 150 MG tablet Take 150 mg by mouth every evening.     Marland Kitchen rOPINIRole (REQUIP) 1 MG tablet Take 1 mg by mouth at bedtime.     . traMADol  (ULTRAM) 50 MG tablet Take 50 mg by mouth daily as needed for severe pain.      No current facility-administered medications on file prior to visit.     Allergies  Allergen Reactions  . Sulfa Antibiotics Nausea Only  Past Medical History:  Diagnosis Date  . Bowel obstruction ([place])   . Diastolic dysfunction   . Gallbladder disease   . GERD (gastroesophageal reflux disease)   . HOCM (hypertrophic obstructive cardiomyopathy) ([place]) 09/27/2016   Echo 10/16: severe LVH, EF 55-60, peak 32, no RWMA, +SAM, mild to mod MR, severe LAE, PASP 28 // Echo 2/18: mod conc LVH, severe septal LVH c/w HCM, EF 60-65, peak LVOT 94, no RWMA, + SAM, severe MR, severe LAE, PASP 25 // Echo 6/18: severe conc LVH, EF 60-65, noRWMA, Gr 2 DD, peak LVOT gradient 20, mild aortic stenosis, mild AI, mild MR, severe LAE, mild TR, PASP 33   . Hypertension   . LVH (left ventricular hypertrophy)    WITH ASYMMETRIC SEPTAL HYPERTROPHY  . Mitral regurgitation 09/09/2016   Severe by echo 08/2016  . Persistent atrial fibrillation 09/09/2016  . Rheumatoid arthritis [facility])     Past Surgical History:  Procedure Laterality Date  . CARDIOVASCULAR STRESS TEST  03/10/2006   EF 69%. NO EVIDENCE OF ISCHEMIA  . CARDIOVERSION N/A 03/21/2018   Procedure: Cancelled Cardioversion;  Surgeon: Thayer Headings, MD;  Location: [facility];  Service: Cardiovascular;  Laterality: N/A;  . CHOLECYSTECTOMY    . TOTAL HIP ARTHROPLASTY    . US ECHOCARDIOGRAPHY  02/21/2008   EF 55-60%  . US ECHOCARDIOGRAPHY  07/21/2005   EF 55-60%    Social History   Tobacco Use  Smoking Status Never Smoker  Smokeless Tobacco Never Used    Social History   Substance and Sexual Activity  Alcohol Use No    Family History  Problem Relation Age of Onset  . Leukemia Father   . Heart attack Brother   . Hypertension Mother   . Stroke Mother   . Breast cancer Neg Hx     Reviw of Systems:  Reviewed in the HPI.  All other systems are  negative.   Physical Exam: Blood pressure 130/60, pulse (!) 55, height 5\' 3"  (1.6 m), weight 157 lb (71.2 kg), SpO2 95 %.  GEN: Elderly female, no acute distress. HEENT: Normal NECK: No JVD; No carotid bruits LYMPHATICS: No lymphadenopathy CARDIAC: Rate S1-S2. RESPIRATORY:  Clear to auscultation without rales, wheezing or rhonchi  ABDOMEN: Soft, non-tender, non-distended MUSCULOSKELETAL:  No edema; No deformity  SKIN: Warm and dry NEUROLOGIC:  Alert and oriented x 3    ECG:   Assessment / Plan:   1. Paroxysmal atrial fib : CHADS2VASC score 5  ( female, age , HTN, CHF)   is maintaining NSR   2.  Chronic diastolic congestive heart failure: Seems to be stable    2. Hyperlipidemia  -    Managed by Dr. Joylene Draft   3. Mitral regurgitation-     4.  Essential hypertension: She was markedly hypertensive last week.  Her blood pressure today is normal.  I am inclined to stay with her current medications. Although bit higher dose of Lasix and potassium.  She has not had any episodes of syncope or presyncope.  To be careful not to volume deplete her too much.  4 dynamic left ventricular obstruction: She has history of hypertrophic obstructive cardiomyopathy.  She seems to be   [name], MD  07/03/2018 2:05 PM    [address] [place],  [place], [place]  [address] Pager [phone] Phone: [phone]; Fax: [phone]

## 2018-07-07 ENCOUNTER — Other Ambulatory Visit: Payer: Self-pay | Admitting: Physician Assistant

## 2018-07-07 DIAGNOSIS — R1031 Right lower quadrant pain: Secondary | ICD-10-CM

## 2018-07-25 ENCOUNTER — Other Ambulatory Visit: Payer: Self-pay | Admitting: Internal Medicine

## 2018-07-25 DIAGNOSIS — Z1231 Encounter for screening mammogram for malignant neoplasm of breast: Secondary | ICD-10-CM

## 2018-08-08 ENCOUNTER — Ambulatory Visit: Payer: Medicare Other | Admitting: Gastroenterology

## 2018-08-08 ENCOUNTER — Encounter: Payer: Self-pay | Admitting: Gastroenterology

## 2018-08-08 VITALS — BP 130/70 | HR 76 | Ht 62.0 in | Wt 152.0 lb

## 2018-08-08 DIAGNOSIS — R143 Flatulence: Secondary | ICD-10-CM

## 2018-08-08 NOTE — Progress Notes (Signed)
Review of pertinent gastrointestinal problems: 1. family history of colon cancer, mother 2. personal history of adenomatous colon polyps, colonoscopy September 2011. Also noted diverticulosis, hemorrhoids, small tubular adenoma. No recommended surveillance colonoscopy given her age, would have been 77 at the time of repeat colonoscopy    HPI: This is a very pleasant 85 year old woman who was referred to me by [name], MD  to evaluate chronic flatulence.   I last saw her here in the office about 8 years ago.  Chief complaint is chronic flatulence  She has had problems with chronic flatulence for about a year or 2 now.  She has not been able to pinpoint any particular foods that would cause it.  She has no issues with her bowels.  No diarrhea, no constipation, no overt bleeding.  She has no abdominal pains.  Her weight has been overall stable.  She does seem to eat a diet that is high in fruits and vegetables, she eats 2 or 3 salads weekly.  Low in lean meats   Old Data Reviewed:  Blood work October 2019 ; CBC was normal, complete metabolic profile was normal      Review of systems: Pertinent positive and negative review of systems were noted in the above HPI section. All other review negative.   Past Medical History:  Diagnosis Date  . Bowel obstruction ([place])   . Diastolic dysfunction   . Gallbladder disease   . GERD (gastroesophageal reflux disease)   . HOCM (hypertrophic obstructive cardiomyopathy) ([place]) 09/27/2016   Echo 10/16: severe LVH, EF 55-60, peak 32, no RWMA, +SAM, mild to mod MR, severe LAE, PASP 28 // Echo 2/18: mod conc LVH, severe septal LVH c/w HCM, EF 60-65, peak LVOT 94, no RWMA, + SAM, severe MR, severe LAE, PASP 25 // Echo 6/18: severe conc LVH, EF 60-65, noRWMA, Gr 2 DD, peak LVOT gradient 20, mild aortic stenosis, mild AI, mild MR, severe LAE, mild TR, PASP 33   . Hypertension   . LVH (left ventricular hypertrophy)    WITH ASYMMETRIC SEPTAL  HYPERTROPHY  . Mitral regurgitation 09/09/2016   Severe by echo 08/2016  . Persistent atrial fibrillation 09/09/2016  . Rheumatoid arthritis [facility])     Past Surgical History:  Procedure Laterality Date  . CARDIOVASCULAR STRESS TEST  03/10/2006   EF 69%. NO EVIDENCE OF ISCHEMIA  . CARDIOVERSION N/A 03/21/2018   Procedure: Cancelled Cardioversion;  Surgeon: Thayer Headings, MD;  Location: [facility];  Service: Cardiovascular;  Laterality: N/A;  . CHOLECYSTECTOMY    . TOTAL HIP ARTHROPLASTY    . US ECHOCARDIOGRAPHY  02/21/2008   EF 55-60%  . US ECHOCARDIOGRAPHY  07/21/2005   EF 55-60%    Current Outpatient Medications  Medication Sig Dispense Refill  . amiodarone (PACERONE) 200 MG tablet Take 1 tablet (200 mg total) by mouth daily. 90 tablet 3  . apixaban (ELIQUIS) 5 MG TABS tablet Take 1 tablet (5 mg total) by mouth 2 (two) times daily. 60 tablet 0  . diltiazem (CARDIZEM CD) 180 MG 24 hr capsule Take 1 capsule (180 mg total) by mouth daily. 30 capsule 0  . folic acid (FOLVITE) 1 MG tablet Take 3 mg by mouth daily.     . furosemide (LASIX) 20 MG tablet Take 20 mg by mouth as directed. Patient takes 1 tablet 5 days a week; Monday-Friday    . irbesartan (AVAPRO) 150 MG tablet Take 150 mg by mouth daily.     . meclizine (ANTIVERT) 25  MG tablet Take 25 mg by mouth daily as needed for dizziness.     . methotrexate (RHEUMATREX) 2.5 MG tablet Take 15 mg by mouth every Saturday.     . metoprolol tartrate (LOPRESSOR) 25 MG tablet Take 1 tablet (25 mg total) by mouth 2 (two) times daily. . 60 tablet 11  . omeprazole (PRILOSEC) 20 MG capsule Take 20 mg by mouth daily.     . potassium chloride (K-DUR) 10 MEQ tablet Take 10 mEq by mouth as directed. Patient takes 1 tablet 5 times a week; Monday-Friday with Lasix    . PROAIR RESPICLICK 259 (90 Base) MCG/ACT AEPB Inhale 2 puffs into the lungs every 6 (six) hours as needed.     Marland Kitchen rOPINIRole (REQUIP) 1 MG tablet Take 1 mg by mouth at bedtime.     . traMADol  (ULTRAM) 50 MG tablet Take 50 mg by mouth daily as needed for severe pain.      No current facility-administered medications for this visit.     Allergies as of 08/08/2018 - Review Complete 08/08/2018  Allergen Reaction Noted  . Sulfa antibiotics Nausea Only 10/14/2015    Family History  Problem Relation Age of Onset  . Leukemia Father   . Heart attack Brother   . Hypertension Mother   . Stroke Mother   . Breast cancer Neg Hx     Social History   Socioeconomic History  . Marital status: Married    Spouse name: Not on file  . Number of children: Not on file  . Years of education: Not on file  . Highest education level: Not on file  Occupational History  . Not on file  Social Needs  . Financial resource strain: Not on file  . Food insecurity:    Worry: Not on file    Inability: Not on file  . Transportation needs:    Medical: Not on file    Non-medical: Not on file  Tobacco Use  . Smoking status: Never Smoker  . Smokeless tobacco: Never Used  Substance and Sexual Activity  . Alcohol use: No  . Drug use: No  . Sexual activity: Not on file  Lifestyle  . Physical activity:    Days per week: Not on file    Minutes per session: Not on file  . Stress: Not on file  Relationships  . Social connections:    Talks on phone: Not on file    Gets together: Not on file    Attends religious service: Not on file    Active member of club or organization: Not on file    Attends meetings of clubs or organizations: Not on file    Relationship status: Not on file  . Intimate partner violence:    Fear of current or ex partner: Not on file    Emotionally abused: Not on file    Physically abused: Not on file    Forced sexual activity: Not on file  Other Topics Concern  . Not on file  Social History Narrative  . Not on file     Physical Exam: BP 130/70   Pulse 76   Ht 5\' 2"  (1.575 m)   Wt 152 lb (68.9 kg)   LMP  (LMP Unknown)   BMI 27.80 kg/m  Constitutional:  generally well-appearing Psychiatric: alert and oriented x3 Eyes: extraocular movements intact Mouth: oral pharynx moist, no lesions Neck: supple no lymphadenopathy Cardiovascular: heart regular rate and rhythm Lungs: clear to auscultation bilaterally Abdomen: soft,  nontender, nondistended, no obvious ascites, no peritoneal signs, normal bowel sounds Extremities: no lower extremity edema bilaterally Skin: no lesions on visible extremities   Assessment and plan: 85 y.o. female with chronic flatulence  I explained to her that chronic flatulence is almost always dietary related.  She eats a very healthy diet overall but it is often healthier diets that cause a lot of gas.  I recommended that she start to modify her diet by cutting out lettuce, cabbage and salads.  Given her a gas-forming diet handout and she will return to see me in 6 weeks and sooner if needed.  There are no alarm symptoms.  I see no reason for any further blood tests or imaging studies.    Please see the "Patient Instructions" section for addition details about the plan.   Owens Loffler, MD [facility] 08/08/2018, 9:02 AM  Cc: [name], MD

## 2018-08-08 NOTE — Patient Instructions (Addendum)
Cut out lettuce, cabbage and salads from your diet. Gas handout given. Please return to see Dr. Ardis Hughs in 6 weeks.09/13/18 at [place]  Thank you for entrusting me with your care and choosing [facility].  Dr Ardis Hughs

## 2018-08-23 ENCOUNTER — Ambulatory Visit
Admission: RE | Admit: 2018-08-23 | Discharge: 2018-08-23 | Disposition: A | Discharge status: Home / Self Care | Payer: Medicare Other | Source: Ambulatory Visit | Attending: Internal Medicine | Admitting: Internal Medicine

## 2018-08-23 DIAGNOSIS — Z1231 Encounter for screening mammogram for malignant neoplasm of breast: Secondary | ICD-10-CM

## 2018-09-13 ENCOUNTER — Encounter: Payer: Self-pay | Admitting: Gastroenterology

## 2018-09-13 ENCOUNTER — Ambulatory Visit: Payer: Medicare Other | Admitting: Gastroenterology

## 2018-09-13 ENCOUNTER — Encounter (INDEPENDENT_AMBULATORY_CARE_PROVIDER_SITE_OTHER): Payer: Self-pay

## 2018-09-13 VITALS — BP 140/64 | HR 63 | Ht 62.0 in | Wt 147.8 lb

## 2018-09-13 DIAGNOSIS — R143 Flatulence: Secondary | ICD-10-CM | POA: Diagnosis not present

## 2018-09-13 NOTE — Progress Notes (Signed)
Review of pertinent gastrointestinal problems: 1. family history of colon cancer, mother 2. personal history of adenomatous colon polyps, colonoscopy September 2011.Also noted diverticulosis, hemorrhoids, small tubular adenoma. No recommended surveillance colonoscopy given her age, would have been 45 at the time of repeat colonoscopy 3.  Significant flatulence, evaluated January 2020 felt this was dietary related.  Symptoms improved significantly when she avoided lettuce, cabbage and wheat-based cereals  HPI: This is a very pleasant 85 year old woman whom I last saw about 5 or 6 weeks ago  Since then  she cut out cabbage and almost all salads in the past 5 weeks.  She has cut out wheat based cereal and is instead eating a rice based cereal. She is at least 50% better in terms of gas.  She is quite happy with the results and feels that she will be able to continue these changes indefinitely    Chief complaint is flatulence  ROS: complete GI ROS as described in HPI, all other review negative.  Constitutional:  No unintentional weight loss   Past Medical History:  Diagnosis Date  . Bowel obstruction ([place])   . Diastolic dysfunction   . Gallbladder disease   . GERD (gastroesophageal reflux disease)   . HOCM (hypertrophic obstructive cardiomyopathy) ([place]) 09/27/2016   Echo 10/16: severe LVH, EF 55-60, peak 32, no RWMA, +SAM, mild to mod MR, severe LAE, PASP 28 // Echo 2/18: mod conc LVH, severe septal LVH c/w HCM, EF 60-65, peak LVOT 94, no RWMA, + SAM, severe MR, severe LAE, PASP 25 // Echo 6/18: severe conc LVH, EF 60-65, noRWMA, Gr 2 DD, peak LVOT gradient 20, mild aortic stenosis, mild AI, mild MR, severe LAE, mild TR, PASP 33   . Hypertension   . LVH (left ventricular hypertrophy)    WITH ASYMMETRIC SEPTAL HYPERTROPHY  . Mitral regurgitation 09/09/2016   Severe by echo 08/2016  . Persistent atrial fibrillation 09/09/2016  . Rheumatoid arthritis [facility])     Past Surgical History:   Procedure Laterality Date  . CARDIOVASCULAR STRESS TEST  03/10/2006   EF 69%. NO EVIDENCE OF ISCHEMIA  . CARDIOVERSION N/A 03/21/2018   Procedure: Cancelled Cardioversion;  Surgeon: Thayer Headings, MD;  Location: [facility];  Service: Cardiovascular;  Laterality: N/A;  . CHOLECYSTECTOMY    . TOTAL HIP ARTHROPLASTY    . US ECHOCARDIOGRAPHY  02/21/2008   EF 55-60%  . US ECHOCARDIOGRAPHY  07/21/2005   EF 55-60%    Current Outpatient Medications  Medication Sig Dispense Refill  . amiodarone (PACERONE) 200 MG tablet Take 1 tablet (200 mg total) by mouth daily. 90 tablet 3  . apixaban (ELIQUIS) 5 MG TABS tablet Take 1 tablet (5 mg total) by mouth 2 (two) times daily. 60 tablet 0  . diltiazem (CARDIZEM CD) 180 MG 24 hr capsule Take 1 capsule (180 mg total) by mouth daily. 30 capsule 0  . folic acid (FOLVITE) 1 MG tablet Take 3 mg by mouth daily.     . furosemide (LASIX) 20 MG tablet Take 20 mg by mouth as directed. Patient takes 1 tablet 5 days a week; Monday-Friday    . irbesartan (AVAPRO) 150 MG tablet Take 150 mg by mouth daily.     . meclizine (ANTIVERT) 25 MG tablet Take 25 mg by mouth daily as needed for dizziness.     . methotrexate (RHEUMATREX) 2.5 MG tablet Take 15 mg by mouth every Saturday.     . metoprolol tartrate (LOPRESSOR) 25 MG tablet Take 1 tablet (25  mg total) by mouth 2 (two) times daily. . 60 tablet 11  . omeprazole (PRILOSEC) 20 MG capsule Take 20 mg by mouth daily.     . potassium chloride (K-DUR) 10 MEQ tablet Take 10 mEq by mouth as directed. Patient takes 1 tablet 5 times a week; Monday-Friday with Lasix    . PROAIR RESPICLICK 086 (90 Base) MCG/ACT AEPB Inhale 2 puffs into the lungs every 6 (six) hours as needed.     Marland Kitchen rOPINIRole (REQUIP) 1 MG tablet Take 1 mg by mouth at bedtime.     . traMADol (ULTRAM) 50 MG tablet Take 50 mg by mouth daily as needed for severe pain.      No current facility-administered medications for this visit.     Allergies as of 09/13/2018 -  Review Complete 09/13/2018  Allergen Reaction Noted  . Sulfa antibiotics Nausea Only 10/14/2015    Family History  Problem Relation Age of Onset  . Leukemia Father   . Heart attack Brother   . Hypertension Mother   . Stroke Mother   . Breast cancer Daughter     Social History   Socioeconomic History  . Marital status: Married    Spouse name: Not on file  . Number of children: Not on file  . Years of education: Not on file  . Highest education level: Not on file  Occupational History  . Not on file  Social Needs  . Financial resource strain: Not on file  . Food insecurity:    Worry: Not on file    Inability: Not on file  . Transportation needs:    Medical: Not on file    Non-medical: Not on file  Tobacco Use  . Smoking status: Never Smoker  . Smokeless tobacco: Never Used  Substance and Sexual Activity  . Alcohol use: No  . Drug use: No  . Sexual activity: Not on file  Lifestyle  . Physical activity:    Days per week: Not on file    Minutes per session: Not on file  . Stress: Not on file  Relationships  . Social connections:    Talks on phone: Not on file    Gets together: Not on file    Attends religious service: Not on file    Active member of club or organization: Not on file    Attends meetings of clubs or organizations: Not on file    Relationship status: Not on file  . Intimate partner violence:    Fear of current or ex partner: Not on file    Emotionally abused: Not on file    Physically abused: Not on file    Forced sexual activity: Not on file  Other Topics Concern  . Not on file  Social History Narrative  . Not on file     Physical Exam: BP 140/64   Pulse 63   Ht 5\' 2"  (1.575 m)   Wt 147 lb 12.8 oz (67 kg)   LMP  (LMP Unknown)   BMI 27.03 kg/m  Constitutional: generally well-appearing Psychiatric: alert and oriented x3 Abdomen: soft, nontender, nondistended, no obvious ascites, no peritoneal signs, normal bowel sounds No peripheral  edema noted in lower extremities  Assessment and plan: 85 y.o. female with flatulence dietary related  Her flatulence is much improved since she cut out lettuce and cabbage and wheat-based cereals in her diet.  She is happy with the results and feels that she can continue these changes indefinitely.  I see no  reason for any further treatments or tests.  She knows to call if she has any further questions or concerns.  Please see the "Patient Instructions" section for addition details about the plan.  Owens Loffler, MD [facility] 09/13/2018, 9:18 AM

## 2018-09-13 NOTE — Patient Instructions (Addendum)
Continue avoiding the foods that tend to cause you to have a lot of gas.  Follow up as needed.  Thank you for entrusting me with your care and choosing [facility].  Dr Ardis Hughs

## 2018-10-02 ENCOUNTER — Ambulatory Visit: Payer: Medicare Other | Admitting: Cardiovascular Disease

## 2018-10-27 ENCOUNTER — Telehealth: Payer: Self-pay | Admitting: Cardiovascular Disease

## 2018-10-27 NOTE — Telephone Encounter (Signed)
Christina returned call and I spoke to her about the patient's BP and will notify her with recommendations per Dr Acie Fredrickson.

## 2018-10-27 NOTE — Telephone Encounter (Signed)
Stacy [name] from [place] called to report some elevated BP readings  Today: 160/77 (7:16am) Yesterday: 168/78 (7:35am) Tuesday :178/76  These are all prior to her morning medications. Telephonic care states that the pt does not check bp after taking morning meds. Pt agrees to start taking her bp 1 hr post morning meds to see if the bp gets decreased.  Telephonic care just wanted to make the Doctor aware of recent  readings prior to medication  Telephonic Care would like to be informed of parameters of when to report bp to the office.  As of now it is set to report anything over 150/90.

## 2018-10-27 NOTE — Telephone Encounter (Signed)
Left [employer] nurse Margreta Journey) message to call back. 4/10

## 2018-10-27 NOTE — Telephone Encounter (Signed)
I spoke with the patient in regards to her elevated BP in the morning.  Margreta Journey, RN from [facility] called to report Bps greater than the parameter of 150/90.  160/77, 168/78 and 178/76 all in AM.    The patient states that she also has headaches when she awakens in the morning.  She takes 50 mg of Metoprolol Tartrate in the AM and 25 mg in the PM.  She also takes her Irbesartan 150 mg in the AM.

## 2018-10-30 NOTE — Telephone Encounter (Signed)
Please have her take her blood pressure readings approximately 1 hour after taking her blood pressure medications.  This same issue has occurred in the past.  She has elevated home blood pressure readings but her blood pressure was normal when she came into the office.  We want to make sure that we do not overmedicate her which would likely cause orthostatic hypotension.

## 2018-10-31 NOTE — Telephone Encounter (Signed)
Called and spoke with patient who states she has been taking her BP before she takes her medication in the mornings and these have been the high readings. States when she checks her BP later in the day it is better. Reports that she has a telephone appointment with Dr. Joylene Draft on Friday and is supposed to report BP readings to him. I advised her to record the readings 1-2 hours after she takes her medication. I advised her to call back with questions or concerns. She verbalized understanding and agreement and thanked me for the call.

## 2018-10-31 NOTE — Telephone Encounter (Signed)
Called to speak with patient who family states is not at home currently [facility] with [facility] and left detailed message that Dr. Acie Fredrickson has reviewed the message and has reviewed BP readings from previous office visits. I advised that I will contact the patient to make certain her equipment and technique are accurate and that we will advise her if there are any medication changes to make. I thanked her for notifying us of the situation.

## 2018-12-19 ENCOUNTER — Telehealth: Payer: Self-pay | Admitting: Cardiovascular Disease

## 2018-12-19 ENCOUNTER — Telehealth: Payer: Self-pay

## 2018-12-19 NOTE — Telephone Encounter (Signed)
Left message about upcoming appt and switching to phone or video visit.

## 2018-12-19 NOTE — Telephone Encounter (Signed)
Christina at [facility] needs to report some elevated BP to a nurse.

## 2018-12-19 NOTE — Telephone Encounter (Signed)
Stacy [name], [facility] RN, called to report elevated BP readings.   6/2 at 7902: 163/84 5/30 at 4097: 176/84 5/29 at 0934: 167/84  The readings are taking about 1 hour after taking morning medications. Margreta Journey states the patient has complaints of mild headaches that start in the morning and resolve throughout the day.  She reports the patient is taking her medications as directed and is avoiding salt.  She requests Dr. Acie Fredrickson review BP and give 1) medication recommendations and 2) BP guidelines for when to notify the office.  Margreta Journey, RN: Phone: [phone] ex: [phone]

## 2018-12-20 ENCOUNTER — Telehealth: Payer: Self-pay | Admitting: Cardiovascular Disease

## 2018-12-20 NOTE — Telephone Encounter (Signed)
New Message     Pt is calling to confirm appt is a Telephone visit

## 2018-12-20 NOTE — Telephone Encounter (Signed)
Left message on home phone for patient to call back; mobile phone does not have voice mail set up

## 2018-12-20 NOTE — Telephone Encounter (Signed)
error 

## 2018-12-21 MED ORDER — IRBESARTAN 150 MG PO TABS: 300.0000 mg | ORAL_TABLET | Freq: Every day | ORAL | 0 refills | Status: DC

## 2018-12-21 NOTE — Telephone Encounter (Signed)
Lets try Avapro 300 mg QD . She is fairly sensitive to meds so she will need to watch her BP .

## 2018-12-21 NOTE — Telephone Encounter (Signed)
YOUR CARDIOLOGY TEAM HAS ARRANGED FOR AN E-VISIT FOR YOUR APPOINTMENT - PLEASE REVIEW IMPORTANT INFORMATION BELOW SEVERAL DAYS PRIOR TO YOUR APPOINTMENT  Due to the recent COVID-19 pandemic, we are transitioning in-person office visits to tele-medicine visits in an effort to decrease unnecessary exposure to our patients, their families, and staff. These visits are billed to your insurance just like a normal visit is. We also encourage you to sign up for MyChart if you have not already done so. You will need a smartphone if possible. For patients that do not have this, we can still complete the visit using a regular telephone but do prefer a smartphone to enable video when possible. You may have a family member that lives with you that can help. If possible, we also ask that you have a blood pressure cuff and scale at home to measure your blood pressure, heart rate and weight prior to your scheduled appointment. Patients with clinical needs that need an in-person evaluation and testing will still be able to come to the office if absolutely necessary. If you have any questions, feel free to call our office.     YOUR PROVIDER WILL BE USING THE FOLLOWING PLATFORM TO COMPLETE YOUR VISIT:  telephone    2-3 DAYS BEFORE YOUR APPOINTMENT  You will receive a telephone call from one of our [facility] team members - your caller ID may say "Unknown caller." If this is a video visit, we will walk you through how to get the video launched on your phone. We will remind you check your blood pressure, heart rate and weight prior to your scheduled appointment. If you have an Apple Watch or Kardia, please upload any pertinent ECG strips the day before or morning of your appointment to [facility]. Our staff will also make sure you have reviewed the consent and agree to move forward with your scheduled tele-health visit.     THE DAY OF YOUR APPOINTMENT  Approximately 15 minutes prior to your scheduled appointment, you will  receive a telephone call from one of [facility] team - your caller ID may say "Unknown caller."  Our staff will confirm medications, vital signs for the day and any symptoms you may be experiencing. Please have this information available prior to the time of visit start. It may also be helpful for you to have a pad of paper and pen handy for any instructions given during your visit. They will also walk you through joining the smartphone meeting if this is a video visit.    CONSENT FOR TELE-HEALTH VISIT - PLEASE REVIEW  I hereby voluntarily request, consent and authorize [facility] and its employed or contracted physicians, physician assistants, nurse practitioners or other licensed health care professionals (the Practitioner), to provide me with telemedicine health care services (the "Services") as deemed necessary by the treating Practitioner. I acknowledge and consent to receive the Services by the Practitioner via telemedicine. I understand that the telemedicine visit will involve communicating with the Practitioner through live audiovisual communication technology and the disclosure of certain medical information by electronic transmission. I acknowledge that I have been given the opportunity to request an in-person assessment or other available alternative prior to the telemedicine visit and am voluntarily participating in the telemedicine visit.  I understand that I have the right to withhold or withdraw my consent to the use of telemedicine in the course of my care at any time, without affecting my right to future care or treatment, and that the Practitioner or I may  terminate the telemedicine visit at any time. I understand that I have the right to inspect all information obtained and/or recorded in the course of the telemedicine visit and may receive copies of available information for a reasonable fee.  I understand that some of the potential risks of receiving the Services via telemedicine  include:  Marland Kitchen Delay or interruption in medical evaluation due to technological equipment failure or disruption; . Information transmitted may not be sufficient (e.g. poor resolution of images) to allow for appropriate medical decision making by the Practitioner; and/or  . In rare instances, security protocols could fail, causing a breach of personal health information.  Furthermore, I acknowledge that it is my responsibility to provide information about my medical history, conditions and care that is complete and accurate to the best of my ability. I acknowledge that Practitioner's advice, recommendations, and/or decision may be based on factors not within their control, such as incomplete or inaccurate data provided by me or distortions of diagnostic images or specimens that may result from electronic transmissions. I understand that the practice of medicine is not an exact science and that Practitioner makes no warranties or guarantees regarding treatment outcomes. I acknowledge that I will receive a copy of this consent concurrently upon execution via email to the email address I last provided but may also request a printed copy by calling the office of [facility].    I understand that my insurance will be billed for this visit.   I have read or had this consent read to me. . I understand the contents of this consent, which adequately explains the benefits and risks of the Services being provided via telemedicine.  . I have been provided ample opportunity to ask questions regarding this consent and the Services and have had my questions answered to my satisfaction. . I give my informed consent for the services to be provided through the use of telemedicine in my medical care  By participating in this telemedicine visit I agree to the above.

## 2018-12-21 NOTE — Telephone Encounter (Signed)
Spoke with patient who states BP today is 120 "over something low but BP is never this low."  States she checks pulse, BP and weight daily for [employer] and systolic BP is consistently >150 mmHg. I advised her to increase her Avapro to 300 mg unless her systolic BP is <448 mmHg. She verbalized understanding and agreement and will call back prior to virtual visit on 6/12 with Dr. Acie Fredrickson if she has any questions or concerns. She thanked me for the call.

## 2018-12-29 ENCOUNTER — Encounter: Payer: Self-pay | Admitting: Cardiovascular Disease

## 2018-12-29 ENCOUNTER — Other Ambulatory Visit: Payer: Self-pay

## 2018-12-29 ENCOUNTER — Telehealth (INDEPENDENT_AMBULATORY_CARE_PROVIDER_SITE_OTHER): Payer: Medicare Other | Admitting: Cardiovascular Disease

## 2018-12-29 VITALS — BP 153/76 | Ht 62.0 in | Wt 145.0 lb

## 2018-12-29 DIAGNOSIS — Z7189 Other specified counseling: Secondary | ICD-10-CM | POA: Diagnosis not present

## 2018-12-29 DIAGNOSIS — I10 Essential (primary) hypertension: Secondary | ICD-10-CM

## 2018-12-29 DIAGNOSIS — I5032 Chronic diastolic (congestive) heart failure: Secondary | ICD-10-CM

## 2018-12-29 MED ORDER — HYDRALAZINE HCL 25 MG PO TABS: ORAL_TABLET | ORAL | 3 refills | Status: DC

## 2018-12-29 MED ORDER — IRBESARTAN 300 MG PO TABS: 300.0000 mg | ORAL_TABLET | Freq: Every day | ORAL | 3 refills | Status: DC

## 2018-12-29 MED ORDER — AMIODARONE HCL 200 MG PO TABS: 200.0000 mg | ORAL_TABLET | Freq: Every day | ORAL | 3 refills | Status: DC

## 2018-12-29 NOTE — Progress Notes (Signed)
Virtual Visit via Telephone Note   This visit type was conducted due to national recommendations for restrictions regarding the COVID-19 Pandemic (e.g. social distancing) in an effort to limit this patient's exposure and mitigate transmission in our community.  Due to her co-morbid illnesses, this patient is at least at moderate risk for complications without adequate follow up.  This format is felt to be most appropriate for this patient at this time.  The patient did not have access to video technology/had technical difficulties with video requiring transitioning to audio format only (telephone).  All issues noted in this document were discussed and addressed.  No physical exam could be performed with this format.  Please refer to the patient's chart for her  consent to telehealth for [facility].   Date:  12/29/2018   ID:  Stacy [name], DOB Oct 09, 1928, MRN [id number]  Patient Location: Home Provider Location: Home  PCP:  [name], MD  Cardiologist:  [name], MD  Electrophysiologist:  None   Problems: 1. 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 Hyperlipidemia 3. Mitral regurgitation 4. Paroxysmal atrial fib (diagnosed Feb. 2018 )  CHads2VASc 5 ( female  age 77, HTN, diastolic CHF)     previous notes   Stacy [name] is done very well from a cardiac standpoint. She's not had any episodes of chest pain or shortness of breath. She's had a cough and bronchitis for the past several weeks. She denies any syncope or presyncope. She denies any chest pain.  Her blood pressure has been well-controlled on. She's not had any problems.   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  April 10 , 2014:  No chest pain.  No dyspnea.  BP has been ok.  She gets some exercise - not as much as she should - also has orthopedic issues that limit her.   Sept. 15, 2014:  She has a mild dynamic  LVOT obstruction.   We increased her  metoprolol at the last visit and she is doing well.  No dizziness.  She does have some vertigo and takes meclizine.  Sept. 17, 2015: Doing well.  No CP or dyspnea.   Exercising regularly.  Sept. 23, 2016:  Doing well from a cardiac standpoint Had an episode of vertigo a week ago .  Lasted for several minutes.  Did not go to the ER.  Symptoms have resolved completely   October 14, 2015: Stacy [name] has a dynamic LVOT obstruction that we are managing with metoprolol.   Her HR has been a bit slow She is off the  HCTZ .   i've advised her to avoid being volume depleted.  Has had some dizziness and headaches recently  Taking metoprolol 50 mg in the am , 25 mg in the evening   January 03, 2017:  Stacy [name] is seen today for follow up visit She was found to have Atrial fib with RVR ( FEb. 21-23 hospitalization) and has been started on Eliquis  She has converted back to NSR / sinus bradycardia  Echo shows normal LV systolic function, grade 2 diastolic dysfunction, mild AS   Has not been taking her Eliquis - having occasional bloody nose. Has some bleeding under her right great toe.  BP is typically better at home   September 26, 2017   Stacy [name] is an 85 year old female with a history of paroxysmal atrial fibrillation, hypertrophic obstructive cardiomyopathy, mitral regurgitation, hyperlipidemia and diastolic heart failure.  She was  seen in October, 2018 by Richardson Dopp, PA.  Avoids salt.  Has some dyspnea.  Able to work out regularly .   Has some DOE with exercise  Has not taken her meds - BP is a bit elevated today  She was on Lasix and kdur.   These were stopped by Dr. Joylene Draft  -possibly because of her dynamic outflow tract obstruction. Has some leg swelling    January 02, 2018:  Stacy [name] is seen back today for follow-up of her chronic diastolic congestive heart failure and her LVOT obstruction.  We added Lasix 20 mg 3 times a week as well as potassium chloride 10 mEq 3 times a  week. She is feeling much better at this point.   No syncope . Getting regular exercise   April 03, 2018:  Stacy [name] is seen back today for follow-up visit.  She has had some volume overload.  She has normal left ventricular systolic function.  In the past we have been concerned about dynamic LVOT obstruction but recent echocardiograms have not demonstrated a significant LVOT obstruction.  She feels fine.  She is part of a program that has home health nurses monitor   her weight.  She recently gained several pounds in 1 day and we  were alerted.  She takes Lasix 20 mg 3 days a week but notes that she really does not get much urine output today after taking the Lasix 20 mg tablets. She is not short of breath.  She has not had any leg edema.  She really does not feel any different today compared to last week with a week before.     Wt Readings from Last 3 Encounters:  04/03/18 155 lb (70.3 kg)  03/10/18 154 lb (69.9 kg)  02/22/18 149 lb 12.8 oz (67.9 kg)       Evaluation Performed:  Follow-Up Visit  Chief Complaint:   Chronic diastolic CHF   History of Present Illness:    Stacy [name] is a 85 y.o. female with chronic diastolic CHF.  BP is still elevated.  Has not gained weight .    The patient does not have symptoms concerning for COVID-19 infection (fever, chills, cough, or new shortness of breath).    Past Medical History:  Diagnosis Date  . Bowel obstruction ([place])   . Diastolic dysfunction   . Gallbladder disease   . GERD (gastroesophageal reflux disease)   . HOCM (hypertrophic obstructive cardiomyopathy) ([place]) 09/27/2016   Echo 10/16: severe LVH, EF 55-60, peak 32, no RWMA, +SAM, mild to mod MR, severe LAE, PASP 28 // Echo 2/18: mod conc LVH, severe septal LVH c/w HCM, EF 60-65, peak LVOT 94, no RWMA, + SAM, severe MR, severe LAE, PASP 25 // Echo 6/18: severe conc LVH, EF 60-65, noRWMA, Gr 2 DD, peak LVOT gradient 20, mild aortic stenosis, mild AI, mild MR,  severe LAE, mild TR, PASP 33   . Hypertension   . LVH (left ventricular hypertrophy)    WITH ASYMMETRIC SEPTAL HYPERTROPHY  . Mitral regurgitation 09/09/2016   Severe by echo 08/2016  . Persistent atrial fibrillation 09/09/2016  . Rheumatoid arthritis [facility])    Past Surgical History:  Procedure Laterality Date  . CARDIOVASCULAR STRESS TEST  03/10/2006   EF 69%. NO EVIDENCE OF ISCHEMIA  . CARDIOVERSION N/A 03/21/2018   Procedure: Cancelled Cardioversion;  Surgeon: Thayer Headings, MD;  Location: [place];  Service: Cardiovascular;  Laterality: N/A;  . CHOLECYSTECTOMY    . TOTAL HIP  ARTHROPLASTY    . US ECHOCARDIOGRAPHY  02/21/2008   EF 55-60%  . US ECHOCARDIOGRAPHY  07/21/2005   EF 55-60%     Current Meds  Medication Sig  . amiodarone (PACERONE) 200 MG tablet Take 1 tablet (200 mg total) by mouth daily. (Patient taking differently: Take 400 mg by mouth daily. )  . apixaban (ELIQUIS) 5 MG TABS tablet Take 1 tablet (5 mg total) by mouth 2 (two) times daily.  Marland Kitchen diltiazem (CARDIZEM CD) 180 MG 24 hr capsule Take 1 capsule (180 mg total) by mouth daily.  . folic acid (FOLVITE) 1 MG tablet Take 3 mg by mouth daily.   . furosemide (LASIX) 20 MG tablet Take 20 mg by mouth as directed. Patient takes 1 tablet 5 days a week; Monday-Friday  . irbesartan (AVAPRO) 150 MG tablet Take 2 tablets (300 mg total) by mouth daily.  . meclizine (ANTIVERT) 25 MG tablet Take 25 mg by mouth daily as needed for dizziness.   . methotrexate (RHEUMATREX) 2.5 MG tablet Take 15 mg by mouth every Saturday.   . metoprolol tartrate (LOPRESSOR) 25 MG tablet Take 1 tablet (25 mg total) by mouth 2 (two) times daily. .  . omeprazole (PRILOSEC) 20 MG capsule Take 20 mg by mouth daily.   . potassium chloride (K-DUR) 10 MEQ tablet Take 10 mEq by mouth as directed. Patient takes 1 tablet 5 times a week; Monday-Friday with Lasix  . PROAIR RESPICLICK 542 (90 Base) MCG/ACT AEPB Inhale 2 puffs into the lungs every 6 (six) hours as  needed.   Marland Kitchen rOPINIRole (REQUIP) 1 MG tablet Take 1 mg by mouth at bedtime.   . traMADol (ULTRAM) 50 MG tablet Take 50 mg by mouth daily as needed for severe pain.      Allergies:   Sulfa antibiotics   Social History   Tobacco Use  . Smoking status: Never Smoker  . Smokeless tobacco: Never Used  Substance Use Topics  . Alcohol use: No  . Drug use: No     Family Hx: The patient's family history includes Breast cancer in her daughter; Heart attack in her brother; Hypertension in her mother; Leukemia in her father; Stroke in her mother.  ROS:   Please see the history of present illness.     All other systems reviewed and are negative.   Prior CV studies:   The following studies were reviewed today:    Labs/Other Tests and Data Reviewed:    EKG:  No ECG reviewed.  Recent Labs: 03/10/2018: NT-Pro BNP 1,410 04/25/2018: ALT 17; BUN 23; Creatinine, Ser 0.80; Hemoglobin 13.3; Platelets 224; Potassium 3.6; Sodium 140   Recent Lipid Panel Lab Results  Component Value Date/Time   CHOL 185 09/10/2016 12:12 AM   TRIG 136 09/10/2016 12:12 AM   HDL 43 09/10/2016 12:12 AM   CHOLHDL 4.3 09/10/2016 12:12 AM   LDLCALC 115 (H) 09/10/2016 12:12 AM    Wt Readings from Last 3 Encounters:  12/29/18 145 lb (65.8 kg)  09/13/18 147 lb 12.8 oz (67 kg)  08/08/18 152 lb (68.9 kg)     Objective:    Vital Signs:  BP (!) 153/76   Ht 5\' 2"  (1.575 m)   Wt 145 lb (65.8 kg)   LMP  (LMP Unknown)   BMI 26.52 kg/m      ASSESSMENT & PLAN:    1. Chronic diastolic chf:  Cont to avoid salt.   Will work on BP   2.  HTN:  Add hydralazine 25 TID.  She avids salt.   Unable to get a HR for [place] so will not change dilt or metoprolol    3.  Atrial fib - cont amio 200 mg a day for now   COVID-19 Education: The signs and symptoms of COVID-19 were discussed with the patient and how to seek care for testing (follow up with PCP or arrange E-visit).  The importance of social distancing was discussed  today.  Time:   Today, I have spent  17  minutes with the patient with telehealth technology discussing the above problems.     Medication Adjustments/Labs and Tests Ordered: Current medicines are reviewed at length with the patient today.  Concerns regarding medicines are outlined above.   Tests Ordered: No orders of the defined types were placed in this encounter.   Medication Changes: No orders of the defined types were placed in this encounter.   Follow Up:  In Person in 3 month(s)  Signed, [name], MD  12/29/2018 8:53 AM    [place]

## 2018-12-29 NOTE — Patient Instructions (Addendum)
Medication Instructions:  Your physician has recommended you make the following change in your medication:  DECREASE Amiodarone to 200 mg daily START Hydralazine 25 mg three times per day for your high blood pressure - take only if BP >140 (top number)   If you need a refill on your cardiac medications before your next appointment, please call your pharmacy.   Lab work: None Ordered  If you have labs (blood work) drawn today and your tests are completely normal, you will receive your results only by: Marland Kitchen MyChart Message (if you have MyChart) OR . A paper copy in the mail If you have any lab test that is abnormal or we need to change your treatment, we will call you to review the results.  Testing/Procedures: None Ordered   Follow-Up: Your physician recommends that you return for a follow-up appointment on Friday September 11 at 11:00 am with Dr. Acie Fredrickson

## 2019-03-29 NOTE — Progress Notes (Signed)
Stacy [name] Date of Birth  31-Jul-1928 [place] [address]    [place], [place]  [address]    [place],   [address] [phone]  Fax  [phone]  [phone]  Fax [phone]  Problems: 1. 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 Hyperlipidemia 3. Mitral regurgitation 4. Paroxysmal atrial fib (diagnosed Feb. 2018 )  CHads2VASc 5 ( female  age 85, HTN, diastolic CHF)     previous notes   Stacy [name] is done very well from a cardiac standpoint. She's not had any episodes of chest pain or shortness of breath. She's had a cough and bronchitis for the past several weeks. She denies any syncope or presyncope. She denies any chest pain.  Her blood pressure has been well-controlled on. She's not had any problems.   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  April 10 , 2014:  No chest pain.  No dyspnea.  BP has been ok.  She gets some exercise - not as much as she should - also has orthopedic issues that limit her.   Sept. 15, 2014:  She has a mild dynamic  LVOT obstruction.   We increased her metoprolol at the last visit and she is doing well.  No dizziness.  She does have some vertigo and takes meclizine.  Sept. 17, 2015: Doing well.  No CP or dyspnea.   Exercising regularly.  Sept. 23, 2016:  Doing well from a cardiac standpoint Had an episode of vertigo a week ago .  Lasted for several minutes.  Did not go to the ER.  Symptoms have resolved completely   October 14, 2015: Stacy [name] has a dynamic LVOT obstruction that we are managing with metoprolol.   Her HR has been a bit slow She is off the  HCTZ .   i've advised her to avoid being volume depleted.  Has had some dizziness and headaches recently  Taking metoprolol 50 mg in the am , 25 mg in the evening   January 03, 2017:  Stacy [name] is seen today for follow up visit She was found to have  Atrial fib with RVR ( FEb. 21-23 hospitalization) and has been started on Eliquis  She has converted back to NSR / sinus bradycardia  Echo shows normal LV systolic function, grade 2 diastolic dysfunction, mild AS   Has not been taking her Eliquis - having occasional bloody nose. Has some bleeding under her right great toe.  BP is typically better at home   September 26, 2017   Stacy [name] is an 85 year old female with a history of paroxysmal atrial fibrillation, hypertrophic obstructive cardiomyopathy, mitral regurgitation, hyperlipidemia and diastolic heart failure.  She was seen in October, 2018 by Richardson Dopp, PA.  Avoids salt.  Has some dyspnea.  Able to work out regularly .   Has some DOE with exercise  Has not taken her meds - BP is a bit elevated today  She was on Lasix and kdur.   These were stopped by Dr. Joylene Draft  -possibly because of her dynamic outflow tract obstruction. Has some leg swelling    January 02, 2018:  Stacy [name] is seen back today for follow-up of her chronic diastolic congestive heart failure and her LVOT obstruction.  We added Lasix 20 mg 3 times a week as well as potassium chloride 10 mEq 3 times a week. She  is feeling much better at this point.   No syncope . Getting regular exercise   April 03, 2018:  Stacy [name] is seen back today for follow-up visit.  She has had some volume overload.  She has normal left ventricular systolic function.  In the past we have been concerned about dynamic LVOT obstruction but recent echocardiograms have not demonstrated a significant LVOT obstruction.  She feels fine.  She is part of a program that has home health nurses monitor   her weight.  She recently gained several pounds in 1 day and we  were alerted.  She takes Lasix 20 mg 3 days a week but notes that she really does not get much urine output today after taking the Lasix 20 mg tablets. She is not short of breath.  She has not had any leg edema.  She really does not feel any  different today compared to last week with a week before.  Dec. 16, 2019: Stacy [name] is seen back today for work in visit.  She was recently seen by her primary medical doctor and was markedly hypertensive. She did not eat any extra salt. He increased the Lasix and Kdur to 5 times a week . Now her BP readings are great.  shes tolerating the increased meds.   Sept. 11, 2020  Stacy [name] is seen today for follow up of her PAF, HTN, HOCM .  Wakes up with headaches  BP looks great.  Stays active.  Works in her yard, garden, housework  No syncope,    Wt Readings from Last 3 Encounters:  12/29/18 145 lb (65.8 kg)  09/13/18 147 lb 12.8 oz (67 kg)  08/08/18 152 lb (68.9 kg)    Current Outpatient Medications on File Prior to Visit  Medication Sig Dispense Refill  . amiodarone (PACERONE) 200 MG tablet Take 1 tablet (200 mg total) by mouth daily. 90 tablet 3  . apixaban (ELIQUIS) 5 MG TABS tablet Take 1 tablet (5 mg total) by mouth 2 (two) times daily. 60 tablet 0  . diltiazem (CARDIZEM CD) 180 MG 24 hr capsule Take 1 capsule (180 mg total) by mouth daily. 30 capsule 0  . folic acid (FOLVITE) 1 MG tablet Take 3 mg by mouth daily.     . furosemide (LASIX) 20 MG tablet Take 20 mg by mouth as directed. Patient takes 1 tablet 5 days a week; Monday-Friday    . hydrALAZINE (APRESOLINE) 25 MG tablet Take 1 tablet 3 times daily if BP >140 270 tablet 3  . irbesartan (AVAPRO) 300 MG tablet Take 1 tablet (300 mg total) by mouth daily. 90 tablet 3  . meclizine (ANTIVERT) 25 MG tablet Take 25 mg by mouth daily as needed for dizziness.     . methotrexate (RHEUMATREX) 2.5 MG tablet Take 15 mg by mouth every Saturday.     . metoprolol tartrate (LOPRESSOR) 25 MG tablet Take 25 mg by mouth 2 (two) times daily. Patient takes 50 mg in the AM and 25 mg in the PM    . omeprazole (PRILOSEC) 20 MG capsule Take 20 mg by mouth daily.     . potassium chloride (K-DUR) 10 MEQ tablet Take 10 mEq by mouth as directed.  Patient takes 1 tablet 5 times a week; Monday-Friday with Lasix    . PROAIR RESPICLICK 123XX123 (90 Base) MCG/ACT AEPB Inhale 2 puffs into the lungs every 6 (six) hours as needed.     Marland Kitchen rOPINIRole (REQUIP) 1 MG tablet Take 1 mg  by mouth at bedtime.     . traMADol (ULTRAM) 50 MG tablet Take 50 mg by mouth daily as needed for severe pain.      No current facility-administered medications on file prior to visit.     Allergies  Allergen Reactions  . Sulfa Antibiotics Nausea Only    Past Medical History:  Diagnosis Date  . Bowel obstruction ([place])   . Diastolic dysfunction   . Gallbladder disease   . GERD (gastroesophageal reflux disease)   . HOCM (hypertrophic obstructive cardiomyopathy) ([place]) 09/27/2016   Echo 10/16: severe LVH, EF 55-60, peak 32, no RWMA, +SAM, mild to mod MR, severe LAE, PASP 28 // Echo 2/18: mod conc LVH, severe septal LVH c/w HCM, EF 60-65, peak LVOT 94, no RWMA, + SAM, severe MR, severe LAE, PASP 25 // Echo 6/18: severe conc LVH, EF 60-65, noRWMA, Gr 2 DD, peak LVOT gradient 20, mild aortic stenosis, mild AI, mild MR, severe LAE, mild TR, PASP 33   . Hypertension   . LVH (left ventricular hypertrophy)    WITH ASYMMETRIC SEPTAL HYPERTROPHY  . Mitral regurgitation 09/09/2016   Severe by echo 08/2016  . Persistent atrial fibrillation 09/09/2016  . Rheumatoid arthritis [facility])     Past Surgical History:  Procedure Laterality Date  . CARDIOVASCULAR STRESS TEST  03/10/2006   EF 69%. NO EVIDENCE OF ISCHEMIA  . CARDIOVERSION N/A 03/21/2018   Procedure: Cancelled Cardioversion;  Surgeon: Thayer Headings, MD;  Location: [facility];  Service: Cardiovascular;  Laterality: N/A;  . CHOLECYSTECTOMY    . TOTAL HIP ARTHROPLASTY    . US ECHOCARDIOGRAPHY  02/21/2008   EF 55-60%  . US ECHOCARDIOGRAPHY  07/21/2005   EF 55-60%    Social History   Tobacco Use  Smoking Status Never Smoker  Smokeless Tobacco Never Used    Social History   Substance and Sexual Activity  Alcohol Use No     Family History  Problem Relation Age of Onset  . Leukemia Father   . Heart attack Brother   . Hypertension Mother   . Stroke Mother   . Breast cancer Daughter     Reviw of Systems:  Reviewed in the HPI.  All other systems are negative.  Physical Exam: There were no vitals taken for this visit.  GEN:  Well nourished, well developed in no acute distress HEENT: Normal NECK: No JVD; No carotid bruits LYMPHATICS: No lymphadenopathy CARDIAC: RRR 99991111 systolic murmur  RESPIRATORY:  Clear to auscultation without rales, wheezing or rhonchi  ABDOMEN: Soft, non-tender, non-distended MUSCULOSKELETAL:  No edema; No deformity  SKIN: Warm and dry NEUROLOGIC:  Alert and oriented x 3     ECG:  Sept. 11, 2020:  Sinus brady at 51.  1st degree AV block , inc. LBBB    Assessment / Plan:   1. Paroxysmal atrial fib : CHADS2VASC score 5  ( female, age , HTN, CHF)  Continue amiodarone.  Continue Eliquis.  2.  Chronic diastolic congestive heart failure: She is very doing very well.  Continue current medications.   2. Hyperlipidemia  -    Managed by Dr. Joylene Draft   3. Mitral regurgitation-   stable.  4.  Essential hypertension: Blood pressure is well controlled.  Continue current meds.  Her heart rate is low but she is asymptomatic and denies any syncope or presyncope.  4 dynamic left ventricular obstruction stable.  Continue current medications.   [name], MD  03/29/2019 8:41 PM    [facility] [address]  [place], [place]  [address] Pager [phone] Phone: [phone]; Fax: [phone]

## 2019-03-30 ENCOUNTER — Other Ambulatory Visit: Payer: Self-pay

## 2019-03-30 ENCOUNTER — Ambulatory Visit (INDEPENDENT_AMBULATORY_CARE_PROVIDER_SITE_OTHER): Payer: Medicare Other | Admitting: Cardiovascular Disease

## 2019-03-30 ENCOUNTER — Encounter: Payer: Self-pay | Admitting: Cardiovascular Disease

## 2019-03-30 VITALS — BP 126/60 | HR 51 | Ht 62.0 in | Wt 149.2 lb

## 2019-03-30 DIAGNOSIS — I5032 Chronic diastolic (congestive) heart failure: Secondary | ICD-10-CM | POA: Diagnosis not present

## 2019-03-30 DIAGNOSIS — I4819 Other persistent atrial fibrillation: Secondary | ICD-10-CM

## 2019-03-30 DIAGNOSIS — I10 Essential (primary) hypertension: Secondary | ICD-10-CM

## 2019-03-30 DIAGNOSIS — I480 Paroxysmal atrial fibrillation: Secondary | ICD-10-CM

## 2019-03-30 DIAGNOSIS — I119 Hypertensive heart disease without heart failure: Secondary | ICD-10-CM | POA: Diagnosis not present

## 2019-03-30 NOTE — Patient Instructions (Signed)

## 2019-04-25 ENCOUNTER — Telehealth: Payer: Self-pay | Admitting: Cardiovascular Disease

## 2019-04-25 NOTE — Telephone Encounter (Signed)
New message  Christina (nurse) with [facility] is calling in to report blood pressure trends of patient.   04/23/19- 162/66 hr 51 04/24/19- 153/66 hr 54 04/25/19- 168/74 hr 50  These blood pressures were prior to morning meds.

## 2019-04-25 NOTE — Telephone Encounter (Signed)
Received BP readings from [place] with [facility] and the readings were taken and recorded in the morning prior to the pt taking her morning BP meds.Stacy [name] for Christina to please instruct the pt take her BP and HR a couple of hours after taking her morning meds and to record it the next few days and to be sure she rests for about 15-20 min prior to taking it and be sure she has both feet on th e ground when taking it. To please call us back with the added readings.   Also, LM for pt to talk with her further about how she has been feeling.

## 2019-05-02 ENCOUNTER — Other Ambulatory Visit: Payer: Self-pay | Admitting: Cardiovascular Disease

## 2019-05-02 NOTE — Telephone Encounter (Signed)
Called patient to check on her BP readings. She states she has not "felt normal" since getting a flu shot greater than 1 week ago; states the flu shot makes her feel this way every year.  BP today is 154/60 mmHg, one hour after taking medications but she states when she goes into a physician's office, her BP readings are lower. She states she thinks her machine measures high. I advised her that we will continue current medications and to call back with questions or concerns. She verbalized understanding and agreement with plan and thanked me for the call.

## 2019-09-11 IMAGING — DX DG HIP (WITH OR WITHOUT PELVIS) 2-3V*L*
3 series · 3 of 3 positions shown · non-contrast
Comparison: 04/24/2016

CLINICAL DATA: Left hip pain and unable to bear weight.

EXAM:
DG HIP (WITH OR WITHOUT PELVIS) 2-3V LEFT

[pelvis ap]
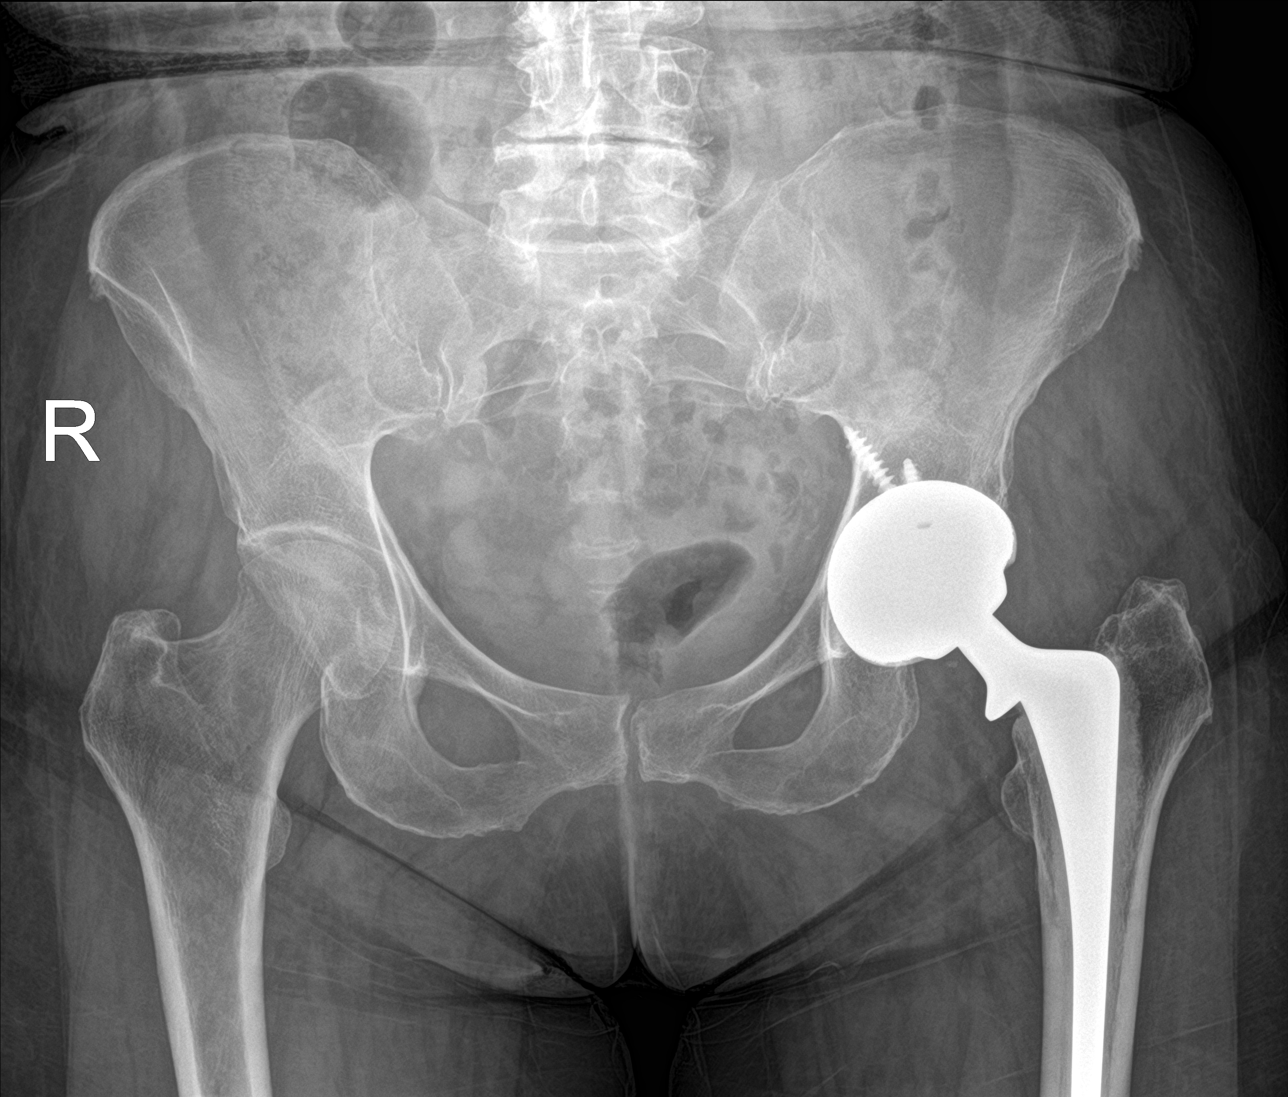

[hip ap]
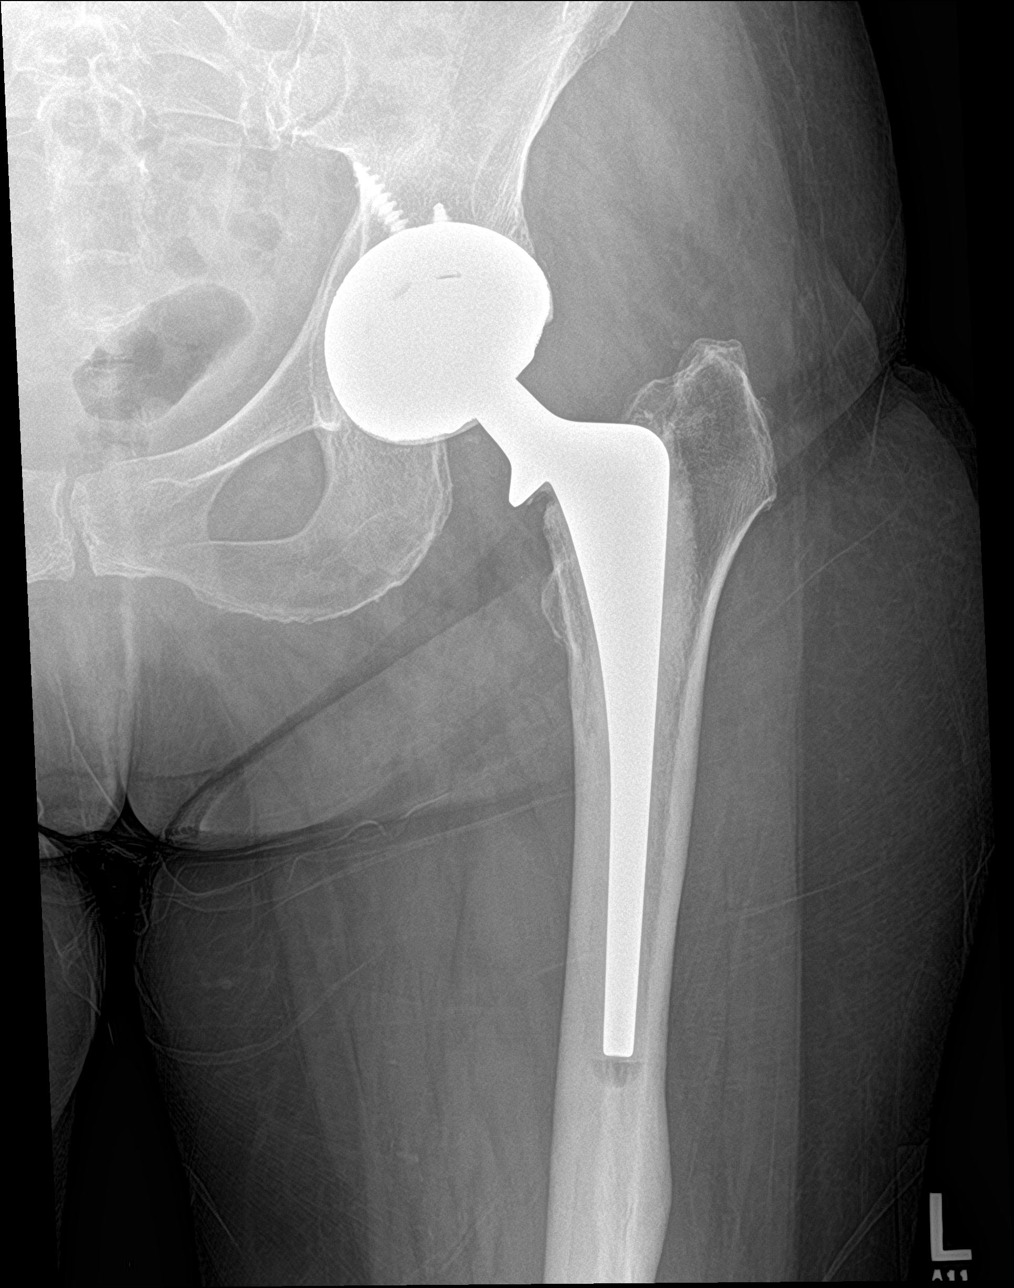

[hip lat]
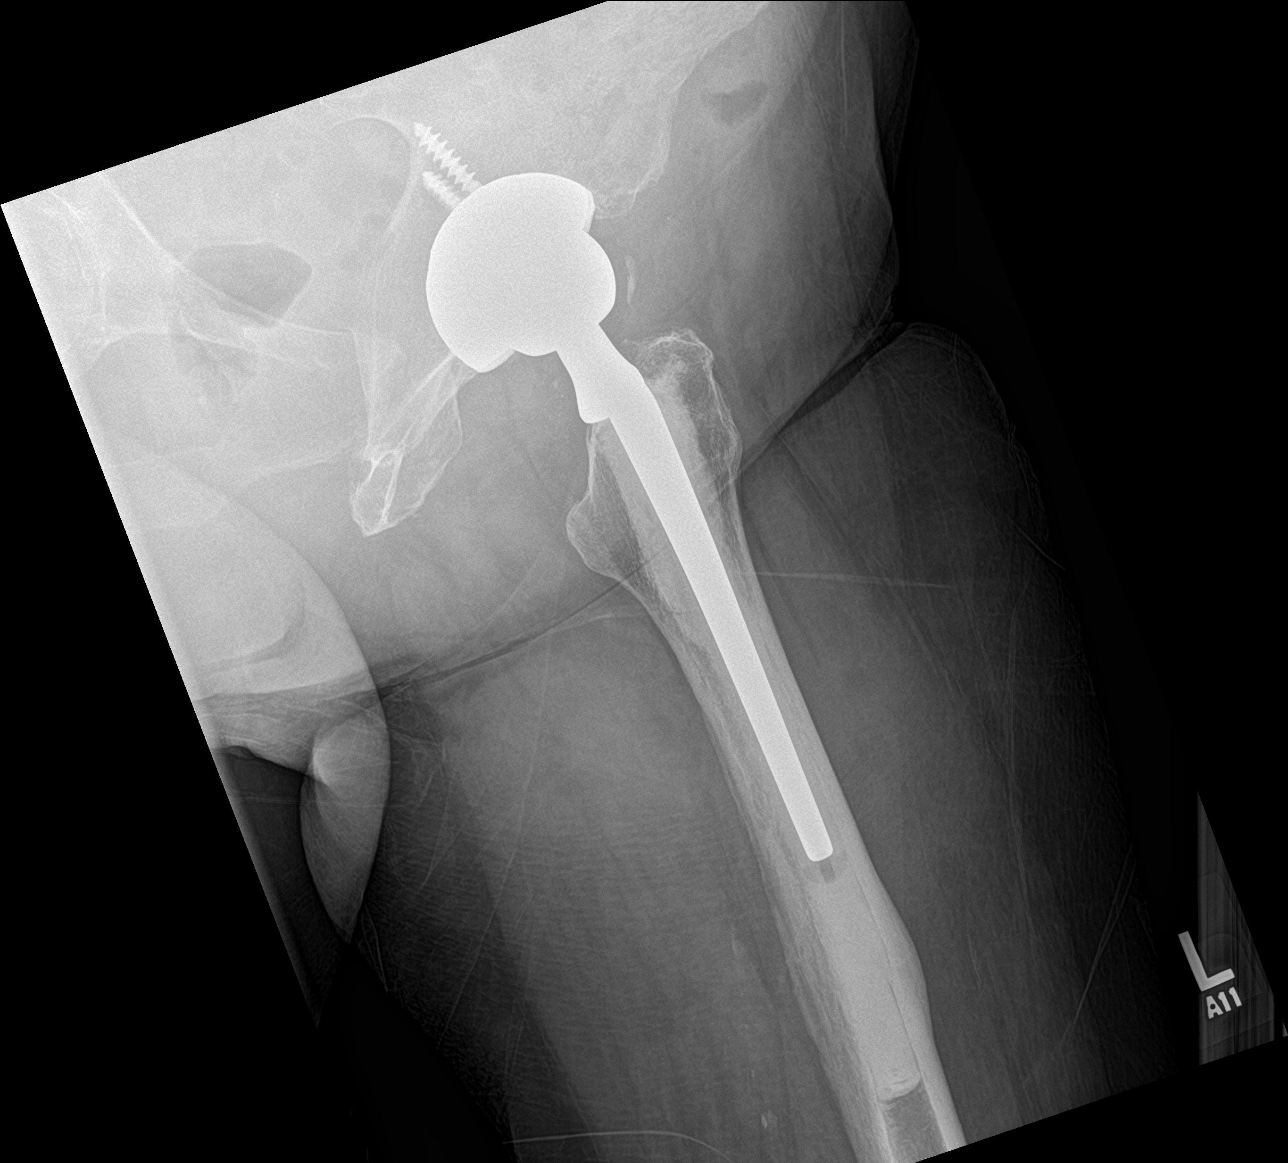

[3 of 3 positions shown; findings below may reference images not displayed]

FINDINGS: Status post left total hip replacement. No evidence for hardware
complication. Femoral component appears located on this exam. SI
joints and symphysis pubis are unremarkable.
IMPRESSION: Negative.

## 2019-09-27 ENCOUNTER — Encounter: Payer: Self-pay | Admitting: Cardiovascular Disease

## 2019-09-27 ENCOUNTER — Other Ambulatory Visit: Payer: Self-pay

## 2019-09-27 ENCOUNTER — Ambulatory Visit: Payer: Medicare Other | Admitting: Cardiovascular Disease

## 2019-09-27 VITALS — BP 112/52 | HR 55 | Ht 62.0 in | Wt 146.2 lb

## 2019-09-27 DIAGNOSIS — I4819 Other persistent atrial fibrillation: Secondary | ICD-10-CM

## 2019-09-27 DIAGNOSIS — I5032 Chronic diastolic (congestive) heart failure: Secondary | ICD-10-CM

## 2019-09-27 DIAGNOSIS — E782 Mixed hyperlipidemia: Secondary | ICD-10-CM

## 2019-09-27 DIAGNOSIS — I421 Obstructive hypertrophic cardiomyopathy: Secondary | ICD-10-CM

## 2019-09-27 MED ORDER — AMIODARONE HCL 200 MG PO TABS: 100.0000 mg | ORAL_TABLET | Freq: Every day | ORAL | 3 refills | Status: DC

## 2019-09-27 NOTE — Progress Notes (Signed)
Stacy [name] Date of Birth  31-Jul-1928 [place] [address]    [place], [place]  [address]    [place],   [address] [phone]  Fax  [phone]  [phone]  Fax [phone]  Problems: 1. 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 Hyperlipidemia 3. Mitral regurgitation 4. Paroxysmal atrial fib (diagnosed Feb. 2018 )  CHads2VASc 5 ( female  age 86, HTN, diastolic CHF)     previous notes   Stacy [name] is done very well from a cardiac standpoint. She's not had any episodes of chest pain or shortness of breath. She's had a cough and bronchitis for the past several weeks. She denies any syncope or presyncope. She denies any chest pain.  Her blood pressure has been well-controlled on. She's not had any problems.   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  April 10 , 2014:  No chest pain.  No dyspnea.  BP has been ok.  She gets some exercise - not as much as she should - also has orthopedic issues that limit her.   Sept. 15, 2014:  She has a mild dynamic  LVOT obstruction.   We increased her metoprolol at the last visit and she is doing well.  No dizziness.  She does have some vertigo and takes meclizine.  Sept. 17, 2015: Doing well.  No CP or dyspnea.   Exercising regularly.  Sept. 23, 2016:  Doing well from a cardiac standpoint Had an episode of vertigo a week ago .  Lasted for several minutes.  Did not go to the ER.  Symptoms have resolved completely   October 14, 2015: Stacy [name] has a dynamic LVOT obstruction that we are managing with metoprolol.   Her HR has been a bit slow She is off the  HCTZ .   i've advised her to avoid being volume depleted.  Has had some dizziness and headaches recently  Taking metoprolol 50 mg in the am , 25 mg in the evening   January 03, 2017:  Stacy [name] is seen today for follow up visit She was found to have  Atrial fib with RVR ( FEb. 21-23 hospitalization) and has been started on Eliquis  She has converted back to NSR / sinus bradycardia  Echo shows normal LV systolic function, grade 2 diastolic dysfunction, mild AS   Has not been taking her Eliquis - having occasional bloody nose. Has some bleeding under her right great toe.  BP is typically better at home   September 26, 2017   Stacy [name] is an 86 year old female with a history of paroxysmal atrial fibrillation, hypertrophic obstructive cardiomyopathy, mitral regurgitation, hyperlipidemia and diastolic heart failure.  She was seen in October, 2018 by Richardson Dopp, PA.  Avoids salt.  Has some dyspnea.  Able to work out regularly .   Has some DOE with exercise  Has not taken her meds - BP is a bit elevated today  She was on Lasix and kdur.   These were stopped by Dr. Joylene Draft  -possibly because of her dynamic outflow tract obstruction. Has some leg swelling    January 02, 2018:  Stacy [name] is seen back today for follow-up of her chronic diastolic congestive heart failure and her LVOT obstruction.  We added Lasix Stacy [name] mg 3 times a week as well as potassium chloride 10 mEq 3 times a week. She  is feeling much better at this point.   No syncope . Getting regular exercise   April 03, 2018:  Stacy [name] is seen back today for follow-up visit.  She has had some volume overload.  She has normal left ventricular systolic function.  In the past we have been concerned about dynamic LVOT obstruction but recent echocardiograms have not demonstrated a significant LVOT obstruction.  She feels fine.  She is part of a program that has home health nurses monitor   her weight.  She recently gained several pounds in 1 day and we  were alerted.  She takes Lasix Stacy [name] mg 3 days a week but notes that she really does not get much urine output today after taking the Lasix Stacy [name] mg tablets. She is not short of breath.  She has not had any leg edema.  She really does not feel any  different today compared to last week with a week before.  Dec. 16, 2019: Stacy [name] is seen back today for work in visit.  She was recently seen by her primary medical doctor and was markedly hypertensive. She did not eat any extra salt. He increased the Lasix and Kdur to 5 times a week . Now her BP readings are great.  shes tolerating the increased meds.   Sept. 11, 2020  Stacy [name] is seen today for follow up of her PAF, HTN, HOCM .  Wakes up with headaches  BP looks great.  Stays active.  Works in her yard, garden, housework  No syncope,   September 27, 2019:  Stacy [name] seen today for follow-up of her paroxysmal atrial fibrillation, hypertension mild hypertrophic obstructive cardiomyopathy. No CP ,  Occasional dyspnea.  No syncope  Walks , works in her garden regularly ,  Does her own housework    Wt Readings from Last 3 Encounters:  09/27/19 146 lb 4 oz (66.3 kg)  09/11/Stacy [name] 149 lb 3.2 oz (67.7 kg)  06/12/Stacy [name] 145 lb (65.8 kg)    Current Outpatient Medications on File Prior to Visit  Medication Sig Dispense Refill  . amiodarone (PACERONE) 200 MG tablet Take 1 tablet (200 mg total) by mouth daily. 90 tablet 3  . apixaban (ELIQUIS) 5 MG TABS tablet Take 1 tablet (5 mg total) by mouth 2 (two) times daily. 60 tablet 0  . buPROPion (WELLBUTRIN SR) 100 MG 12 hr tablet Take 100 mg by mouth every morning.    . diltiazem (CARDIZEM CD) 180 MG 24 hr capsule Take 1 capsule (180 mg total) by mouth daily. 30 capsule 0  . folic acid (FOLVITE) 1 MG tablet Take 3 mg by mouth daily.     . furosemide (LASIX) Stacy [name] MG tablet Take Stacy [name] mg by mouth as directed. Patient takes 1 tablet 5 days a week; Monday-Friday    . hydrALAZINE (APRESOLINE) 25 MG tablet Take 1 tablet 3 times daily if BP >140 270 tablet 3  . irbesartan (AVAPRO) 300 MG tablet Take 1 tablet (300 mg total) by mouth daily. 90 tablet 3  . meclizine (ANTIVERT) 25 MG tablet Take 25 mg by mouth daily as needed for dizziness.     . methotrexate  (RHEUMATREX) 2.5 MG tablet Take 15 mg by mouth every Saturday.     . metoprolol tartrate (LOPRESSOR) 25 MG tablet Take 25 mg by mouth 2 (two) times daily. Patient takes 50 mg in the AM and 25 mg in the PM    . Omega-3 Fatty Acids (KP FISH OIL) 1200 MG CAPS Take 1  capsule by mouth 2 (two) times daily.    . potassium chloride (KLOR-CON) 10 MEQ tablet TAKE 1 TABLET BY MOUTH THREE TIMES A WEEK 45 tablet 2  . PROAIR RESPICLICK 123XX123 (90 Base) MCG/ACT AEPB Inhale 2 puffs into the lungs every 6 (six) hours as needed.     Marland Kitchen rOPINIRole (REQUIP) 1 MG tablet Take 1 mg by mouth at bedtime.     . traMADol (ULTRAM) 50 MG tablet Take 50 mg by mouth daily as needed for severe pain.      No current facility-administered medications on file prior to visit.    Allergies  Allergen Reactions  . Sulfa Antibiotics Nausea Only    Past Medical History:  Diagnosis Date  . Bowel obstruction ([place])   . Diastolic dysfunction   . Gallbladder disease   . GERD (gastroesophageal reflux disease)   . HOCM (hypertrophic obstructive cardiomyopathy) ([place]) 09/27/2016   Echo 10/16: severe LVH, EF 55-60, peak 32, no RWMA, +SAM, mild to mod MR, severe LAE, PASP 28 // Echo 2/18: mod conc LVH, severe septal LVH c/w HCM, EF 60-65, peak LVOT 94, no RWMA, + SAM, severe MR, severe LAE, PASP 25 // Echo 6/18: severe conc LVH, EF 60-65, noRWMA, Gr 2 DD, peak LVOT gradient Stacy [name], mild aortic stenosis, mild AI, mild MR, severe LAE, mild TR, PASP 33   . Hypertension   . LVH (left ventricular hypertrophy)    WITH ASYMMETRIC SEPTAL HYPERTROPHY  . Mitral regurgitation 09/09/2016   Severe by echo 08/2016  . Persistent atrial fibrillation ([place]) 09/09/2016  . Rheumatoid arthritis [facility])     Past Surgical History:  Procedure Laterality Date  . CARDIOVASCULAR STRESS TEST  03/10/2006   EF 69%. NO EVIDENCE OF ISCHEMIA  . CARDIOVERSION N/A 03/21/2018   Procedure: Cancelled Cardioversion;  Surgeon: Thayer Headings, MD;  Location: [facility];  Service:  Cardiovascular;  Laterality: N/A;  . CHOLECYSTECTOMY    . TOTAL HIP ARTHROPLASTY    . US ECHOCARDIOGRAPHY  02/21/2008   EF 55-60%  . US ECHOCARDIOGRAPHY  07/21/2005   EF 55-60%    Social History   Tobacco Use  Smoking Status Never Smoker  Smokeless Tobacco Never Used    Social History   Substance and Sexual Activity  Alcohol Use No    Family History  Problem Relation Age of Onset  . Leukemia Father   . Heart attack Brother   . Hypertension Mother   . Stroke Mother   . Breast cancer Daughter     Reviw of Systems:  Reviewed in the HPI.  All other systems are negative.  Physical Exam: Blood pressure (!) 112/52, pulse (!) 55, height 5\' 2"  (1.575 m), weight 146 lb 4 oz (66.3 kg), SpO2 96 %.  GEN:  Elderly female,  Very healthy,   NAD  HEENT: Normal NECK: No JVD; No carotid bruits LYMPHATICS: No lymphadenopathy CARDIAC: RRR ,  99991111 systolic mumrur  RESPIRATORY:  Clear to auscultation without rales, wheezing or rhonchi  ABDOMEN: Soft, non-tender, non-distended MUSCULOSKELETAL:  No edema; No deformity  SKIN: Warm and dry NEUROLOGIC:  Alert and oriented x 3  ECG:    Assessment / Plan:   1. Paroxysmal atrial fib : CHADS2VASC score 5  ( female, age , HTN, CHF)  Remains in normal sinus rhythm.  She has been on amiodarone for a long time.  We will reduce the dose to 100 mg a day.  She sees Dr. Joylene Draft and he checks lab work including TSH and  liver enzymes.  2.  Chronic diastolic congestive heart failure:   She denies any shortness of breath .     2. Hyperlipidemia  -   Managed by Dr. Joylene Draft   3. Mitral regurgitation-   Stable   4.  Essential hypertension:  BP is well controlled.   4 dynamic left ventricular obstruction stable.  Continue current medications.   [name], MD  09/27/2019 3:40 PM    [facility] [place],  [place], [place]  [address] Pager [phone] Phone: [phone]; Fax: [phone]

## 2019-09-27 NOTE — Patient Instructions (Signed)
Medication Instructions:  Please decrease Amiodarone to 100 mg a day (or 1/2 tablet daily) Continue all other medications as listed.  *If you need a refill on your cardiac medications before your next appointment, please call your pharmacy*  Follow-Up: At [facility], you and your health needs are our priority.  As part of our continuing mission to provide you with exceptional heart care, we have created designated Provider Care Teams.  These Care Teams include your primary Cardiologist (physician) and Advanced Practice Providers (APPs -  Physician Assistants and Nurse Practitioners) who all work together to provide you with the care you need, when you need it.  We recommend signing up for the patient portal called "MyChart".  Sign up information is provided on this After Visit Summary.  MyChart is used to connect with patients for Virtual Visits (Telemedicine).  Patients are able to view lab/test results, encounter notes, upcoming appointments, etc.  Non-urgent messages can be sent to your provider as well.   To learn more about what you can do with MyChart, go to [email or web address].    Your next appointment:   6 month(s)  The format for your next appointment:   In Person  Provider:   [name], MD   Thank you for choosing [facility]!!

## 2019-10-05 ENCOUNTER — Telehealth: Payer: Self-pay | Admitting: Cardiology

## 2019-10-05 NOTE — Telephone Encounter (Signed)
Stacy [name] daughter Caren Griffins called to report that Stacy [name] has had pain behind her right ear for the past 24 hours and has a headache tonight. She has been evaluated recently by PCP for headaches. She does not have difficulty moving, balance issues, change in vision, paresthesias, change in speech. I advised her to make sure she is well hydrated and take Tylenol for pain. If any of the above symptoms develop, they will call 911.   Osvaldo Shipper, MD

## 2019-10-07 NOTE — Telephone Encounter (Signed)
This issue does not sound like a cardiac problem.  Will defer to pcp

## 2020-01-18 ENCOUNTER — Other Ambulatory Visit: Payer: Self-pay | Admitting: Cardiovascular Disease

## 2020-02-25 DIAGNOSIS — N3000 Acute cystitis without hematuria: Secondary | ICD-10-CM | POA: Diagnosis not present

## 2020-02-25 DIAGNOSIS — Z7901 Long term (current) use of anticoagulants: Secondary | ICD-10-CM | POA: Insufficient documentation

## 2020-02-25 DIAGNOSIS — I10 Essential (primary) hypertension: Secondary | ICD-10-CM | POA: Diagnosis not present

## 2020-02-25 DIAGNOSIS — R531 Weakness: Secondary | ICD-10-CM | POA: Insufficient documentation

## 2020-02-25 DIAGNOSIS — Z79899 Other long term (current) drug therapy: Secondary | ICD-10-CM | POA: Diagnosis not present

## 2020-02-25 DIAGNOSIS — R0602 Shortness of breath: Secondary | ICD-10-CM | POA: Diagnosis present

## 2020-02-26 ENCOUNTER — Encounter (HOSPITAL_COMMUNITY): Payer: Self-pay | Admitting: Emergency Medicine

## 2020-02-26 ENCOUNTER — Emergency Department (HOSPITAL_COMMUNITY): Payer: Medicare Other

## 2020-02-26 ENCOUNTER — Other Ambulatory Visit: Payer: Self-pay

## 2020-02-26 ENCOUNTER — Emergency Department (HOSPITAL_COMMUNITY)
Admission: EM | Admit: 2020-02-26 | Discharge: 2020-02-26 | Disposition: A | Discharge status: Home / Self Care | Payer: Medicare Other | Attending: Emergency Medicine | Admitting: Emergency Medicine

## 2020-02-26 DIAGNOSIS — R531 Weakness: Secondary | ICD-10-CM

## 2020-02-26 DIAGNOSIS — N3000 Acute cystitis without hematuria: Secondary | ICD-10-CM

## 2020-02-26 LAB — URINALYSIS, ROUTINE W REFLEX MICROSCOPIC
Bilirubin Urine: NEGATIVE
Glucose, UA: NEGATIVE mg/dL
Hgb urine dipstick: NEGATIVE
Ketones, ur: NEGATIVE mg/dL
Nitrite: NEGATIVE
Protein, ur: NEGATIVE mg/dL
Specific Gravity, Urine: 1.012 (ref 1.005–1.030)
WBC, UA: >50 WBC/hpf — ABNORMAL HIGH (ref 0–5)
pH: 6 (ref 5.0–8.0)

## 2020-02-26 LAB — CBC
HCT: 41.4 % (ref 36.0–46.0)
Hemoglobin: 12.9 g/dL (ref 12.0–15.0)
MCH: 30.4 pg (ref 26.0–34.0)
MCHC: 31.2 g/dL (ref 30.0–36.0)
MCV: 97.6 fL (ref 80.0–100.0)
Platelets: 198 10*3/uL (ref 150–400)
RBC: 4.24 MIL/uL (ref 3.87–5.11)
RDW: 14.7 % (ref 11.5–15.5)
WBC: 9.9 10*3/uL (ref 4.0–10.5)
nRBC: 0 % (ref 0.0–0.2)

## 2020-02-26 LAB — BASIC METABOLIC PANEL
Anion gap: 11 (ref 5–15)
BUN: 28 mg/dL — ABNORMAL HIGH (ref 8–23)
CO2: 26 mmol/L (ref 22–32)
Calcium: 9.1 mg/dL (ref 8.9–10.3)
Chloride: 101 mmol/L (ref 98–111)
Creatinine, Ser: 0.89 mg/dL (ref 0.44–1.00)
GFR calc Af Amer: >60 mL/min (ref >60)
GFR calc non Af Amer: 57 mL/min — ABNORMAL LOW (ref >60)
Glucose, Bld: 90 mg/dL (ref 70–99)
Potassium: 4.1 mmol/L (ref 3.5–5.1)
Sodium: 138 mmol/L (ref 135–145)

## 2020-02-26 MED ORDER — CEPHALEXIN 500 MG PO CAPS
500.0000 mg | ORAL_CAPSULE | Freq: Three times a day (TID) | ORAL | 0 refills | Status: DC
Start: 2020-02-26 — End: 2020-03-11

## 2020-02-26 MED ORDER — CEPHALEXIN 250 MG PO CAPS
500.0000 mg | ORAL_CAPSULE | Freq: Once | ORAL | Status: AC
  Administered 2020-02-26: 500 mg via ORAL
  Filled 2020-02-26: qty 2

## 2020-02-26 MED ORDER — SODIUM CHLORIDE 0.9% FLUSH: 3.0000 mL | Freq: Once | INTRAVENOUS | Status: DC

## 2020-02-26 NOTE — ED Notes (Signed)
Patient transported to CT 

## 2020-02-26 NOTE — ED Notes (Signed)
RN assumed care of pt, pt denies any needs at this time, pt made aware of plan of care, family at bedside

## 2020-02-26 NOTE — ED Triage Notes (Signed)
Pt reports an episode of SOB, weakness, dizziness and "I got very sweaty."  Reports only feels very weak now.

## 2020-02-26 NOTE — ED Notes (Signed)
Pt back from CT

## 2020-02-26 NOTE — ED Notes (Signed)
Stacy [name]-- son- would like an update or when pt goes back to room - [phone]

## 2020-02-26 NOTE — ED Provider Notes (Signed)
[facility] EMERGENCY DEPARTMENT Provider Note  CSN: [id number] Arrival date & time: 02/25/20 2317    History Chief Complaint  Patient presents with  . Shortness of Breath  . Weakness    HPI  Stacy [name] is a 86 y.o. female reports that last night after doing some dishes she was walking to her bedroom when she had sudden onset of diffuse weakness, unable to stand on her own and slumped to the ground, she did not pass out. No chest pain, SOB or fever. She reports she felt dizzy for a brief time. She was feeling sweating but denies vomiting. She called her son who in turn called EMS. She was still feeling week on arrival but in the time while she was in the waiting room she has returned to her baseline. She has been ambulatory without difficulty and has eaten a meal.    Past Medical History:  Diagnosis Date  . Bowel obstruction ([place])   . Diastolic dysfunction   . Gallbladder disease   . GERD (gastroesophageal reflux disease)   . HOCM (hypertrophic obstructive cardiomyopathy) ([place]) 09/27/2016   Echo 10/16: severe LVH, EF 55-60, peak 32, no RWMA, +SAM, mild to mod MR, severe LAE, PASP 28 // Echo 2/18: mod conc LVH, severe septal LVH c/w HCM, EF 60-65, peak LVOT 94, no RWMA, + SAM, severe MR, severe LAE, PASP 25 // Echo 6/18: severe conc LVH, EF 60-65, noRWMA, Gr 2 DD, peak LVOT gradient 20, mild aortic stenosis, mild AI, mild MR, severe LAE, mild TR, PASP 33   . Hypertension   . LVH (left ventricular hypertrophy)    WITH ASYMMETRIC SEPTAL HYPERTROPHY  . Mitral regurgitation 09/09/2016   Severe by echo 08/2016  . Persistent atrial fibrillation ([place]) 09/09/2016  . Rheumatoid arthritis [facility])     Past Surgical History:  Procedure Laterality Date  . CARDIOVASCULAR STRESS TEST  03/10/2006   EF 69%. NO EVIDENCE OF ISCHEMIA  . CARDIOVERSION N/A 03/21/2018   Procedure: Cancelled Cardioversion;  Surgeon: Thayer Headings, MD;  Location: [facility];  Service: Cardiovascular;  Laterality:  N/A;  . CHOLECYSTECTOMY    . TOTAL HIP ARTHROPLASTY    . US ECHOCARDIOGRAPHY  02/21/2008   EF 55-60%  . US ECHOCARDIOGRAPHY  07/21/2005   EF 55-60%    Family History  Problem Relation Age of Onset  . Leukemia Father   . Heart attack Brother   . Hypertension Mother   . Stroke Mother   . Breast cancer Daughter     Social History   Tobacco Use  . Smoking status: Never Smoker  . Smokeless tobacco: Never Used  Vaping Use  . Vaping Use: Never used  Substance Use Topics  . Alcohol use: No  . Drug use: No     Home Medications Prior to Admission medications   Medication Sig Start Date End Date Taking? Authorizing Provider  amiodarone (PACERONE) 200 MG tablet Take 0.5 tablets (100 mg total) by mouth daily. 09/27/19  Yes Nahser, Wonda Cheng, MD  apixaban (ELIQUIS) 5 MG TABS tablet Take 1 tablet (5 mg total) by mouth 2 (two) times daily. 09/10/16  Yes Florencia Reasons, MD  [facility] SR) 100 MG 12 hr tablet Take 100 mg by mouth every morning. 09/17/19  Yes [provider]  diltiazem (CARDIZEM CD) 180 MG 24 hr capsule Take 1 capsule (180 mg total) by mouth daily. 09/11/16  Yes Florencia Reasons, MD  folic acid (FOLVITE) 1 MG tablet Take 3 mg by  mouth daily.    Yes [provider]  furosemide (LASIX) 20 MG tablet Take 20 mg by mouth every Monday, Wednesday, and Friday.    Yes [provider]  hydrALAZINE (APRESOLINE) 25 MG tablet Take 1 tablet 3 times daily if BP >140 Patient taking differently: Take 25 mg by mouth 3 (three) times daily as needed (BP >70).  12/29/18  Yes Nahser, Wonda Cheng, MD  irbesartan (AVAPRO) 300 MG tablet TAKE ONE (1) TABLET BY MOUTH ONCE DAILY Patient taking differently: Take 300 mg by mouth daily.  01/18/20  Yes Nahser, Wonda Cheng, MD  meclizine (ANTIVERT) 25 MG tablet Take 25 mg by mouth daily as needed for dizziness.  02/07/15  Yes [provider]  methotrexate (RHEUMATREX) 2.5 MG tablet Take 15 mg by mouth every Saturday.    Yes [provider]  metoprolol tartrate (LOPRESSOR) 25 MG tablet Take 25-50 mg by mouth See admin instructions. Take 2 tablets (50mg ) by mouth every morning and 1 tablet (25mg ) by mouth every evening.   Yes [provider]  Multiple Vitamins-Minerals (EYE VITAMINS PO) Take 1 tablet by mouth daily.   Yes [provider]  Omega-3 Fatty Acids (KP FISH OIL) 1200 MG CAPS Take 1 capsule by mouth 2 (two) times daily. 01/09/09  Yes [provider]  omeprazole (PRILOSEC) 20 MG capsule Take 20 mg by mouth 2 (two) times daily as needed (indigestion, heartburn).  02/12/20  Yes [provider]  potassium chloride (KLOR-CON) 10 MEQ tablet TAKE 1 TABLET BY MOUTH THREE TIMES A WEEK Patient taking differently: Take 10 mEq by mouth every Monday, Wednesday, and Friday. At night 05/02/19  Yes Nahser, Wonda Cheng, MD  PROAIR RESPICLICK [phone] Base) MCG/ACT AEPB Inhale 2 puffs into the lungs every 6 (six) hours as needed (wheezing, shortnes of breath).  04/22/17  Yes [provider]  rOPINIRole (REQUIP) 1 MG tablet Take 1 mg by mouth at bedtime.  03/27/15  Yes [provider]  traMADol (ULTRAM) 50 MG tablet Take 50 mg by mouth daily as needed for severe pain.  02/12/15  Yes [provider]  cephALEXin (KEFLEX) 500 MG capsule Take 1 capsule (500 mg total) by mouth 3 (three) times daily. 02/26/20   Truddie Hidden, MD     Allergies    Sulfa antibiotics   Review of Systems   Review of Systems A comprehensive review of systems was completed and negative except as noted in HPI.    Physical Exam BP (!) 127/59   Pulse (!) 56   Temp 98.6 F (37 C) (Oral)   Resp 18   Ht 5\' 2"  (1.575 m)   Wt 64 kg   LMP  (LMP Unknown)   SpO2 98%   BMI 25.79 kg/m   Physical Exam Vitals and nursing note reviewed.  Constitutional:      Appearance: Normal appearance.  HENT:     Head: Normocephalic and atraumatic.     Nose: Nose normal.     Mouth/Throat:     Mouth: Mucous  membranes are moist.  Eyes:     Extraocular Movements: Extraocular movements intact.     Conjunctiva/sclera: Conjunctivae normal.  Cardiovascular:     Rate and Rhythm: Normal rate.  Pulmonary:     Effort: Pulmonary effort is normal.     Breath sounds: Normal breath sounds.  Abdominal:     General: Abdomen is flat.     Palpations: Abdomen is soft.     Tenderness: There  is no abdominal tenderness.  Musculoskeletal:        General: No swelling. Normal range of motion.     Cervical back: Neck supple.  Skin:    General: Skin is warm and dry.  Neurological:     General: No focal deficit present.     Mental Status: She is alert and oriented to person, place, and time.     Cranial Nerves: No cranial nerve deficit.     Sensory: No sensory deficit.     Motor: No weakness.     Gait: Gait normal.     Deep Tendon Reflexes: Reflexes normal.  Psychiatric:        Mood and Affect: Mood normal.      ED Results / Procedures / Treatments   Labs (all labs ordered are listed, but only abnormal results are displayed) Labs Reviewed  BASIC METABOLIC PANEL - Abnormal; Notable for the following components:      Result Value   BUN 28 (*)    GFR calc non Af Amer 57 (*)    All other components within normal limits  URINALYSIS, ROUTINE W REFLEX MICROSCOPIC - Abnormal; Notable for the following components:   APPearance HAZY (*)    Leukocytes,Ua LARGE (*)    WBC, UA >50 (*)    Bacteria, UA FEW (*)    Non Squamous Epithelial 0-5 (*)    All other components within normal limits  URINE CULTURE  CBC  CBG MONITORING, ED    EKG EKG Interpretation  Date/Time:  Tuesday February 26 2020 00:14:14 EDT Ventricular Rate:  50 PR Interval:  272 QRS Duration: 122 QT Interval:  510 QTC Calculation: 464 R Axis:   37 Text Interpretation: Sinus bradycardia with 1st degree A-V block Non-specific intra-ventricular conduction delay Minimal voltage criteria for LVH, may be normal variant ( Cornell product )  Borderline ECG When compared with ECG of 04/25/2018, Left ventricular hypertrophy is now present Confirmed by Delora Fuel ([id number]) on 02/26/2020 3:01:19 AM Also confirmed by Delora Fuel ([id number]), editor Hattie Perch ([id number])  on 02/26/2020 10:54:33 AM   Radiology CT Head Wo Contrast  Result Date: 02/26/2020 CLINICAL DATA:  Dizziness EXAM: CT HEAD WITHOUT CONTRAST TECHNIQUE: Contiguous axial images were obtained from the base of the skull through the vertex without intravenous contrast. COMPARISON:  04/25/2018 FINDINGS: Brain: Extensive chronic small vessel disease throughout the deep white matter. No acute intracranial abnormality. Specifically, no hemorrhage, hydrocephalus, mass lesion, acute infarction, or significant intracranial injury. Vascular: No hyperdense vessel or unexpected calcification. Skull: No acute calvarial abnormality. Sinuses/Orbits: Visualized paranasal sinuses and mastoids clear. Orbital soft tissues unremarkable. Other: None IMPRESSION: Extensive chronic small vessel disease. No acute intracranial abnormality. Electronically Signed   By: Rolm Baptise M.D.   On: 02/26/2020 20:37    Procedures Procedures  Medications Ordered in the ED Medications  sodium chloride flush (NS) 0.9 % injection 3 mL (has no administration in time range)  cephALEXin (KEFLEX) capsule 500 mg (500 mg Oral Given 02/26/20 2130)     MDM Rules/Calculators/A&P MDM Patient with a single brief episode of global weakness, no reported focal deficits and back to baseline now for several hours while in the ED waiting room. She had BMP and CBC which were neg. EKG without any acute changes. Added UA and head CT.  ED Course  I have reviewed the triage vital signs and the nursing notes.  Pertinent labs & imaging results that were available during my care of the patient were reviewed by  me and considered in my medical decision making (see chart for details).  Clinical Course as of Feb 25 2245  Tue Feb 26, 2020  2126 UA concerning for UTI which may account for her weakness. No signs of sepsis in ED. CT head without acute process. Plan discharge with Abx and PCP followup.    [CS]    Clinical Course User Index [CS] Truddie Hidden, MD    Final Clinical Impression(s) / ED Diagnoses Final diagnoses:  Generalized weakness  Acute cystitis without hematuria    Rx / DC Orders ED Discharge Orders         Ordered    cephALEXin (KEFLEX) 500 MG capsule  3 times daily     Discontinue  Reprint     02/26/20 2245           Truddie Hidden, MD 02/26/20 2246

## 2020-02-27 ENCOUNTER — Telehealth: Payer: Self-pay | Admitting: Cardiovascular Disease

## 2020-02-27 LAB — URINE CULTURE

## 2020-02-27 NOTE — Telephone Encounter (Signed)
New Message:   Margreta Journey from [facility] called. She wanted to report fluciating in pt's blood pressure.   Pt c/o BP issue: STAT if pt c/o blurred vision, one-sided weakness or slurred speech  1. What are your last 5 BP readings?  02-22-20- 153/64  Pulse 50 at 6:20 P.M.-  02-18-20-  150/65 pulse 51 at 4:49 P.M.    02-15-20- 124/53 pulse 57 at 9:05 P.M.  2. Are you having any other symptoms (ex. Dizziness, headache, blurred vision, passed out)?   3. What is your BP issue? Blood pressure is fluctuating, never consistent,- please call patient with advice

## 2020-02-27 NOTE — Telephone Encounter (Signed)
Spoke with pt who states she is enrolled in a program with [employer] and Clare with [employer] must have misunderstood what the pt was saying.  Pt states she has no concerns re: her B/P and that her B/P has been very good.  Pt reports she did go to the ED yesterday but not for her B/P.  Pt reports she feels much better today and has Rx's to take.  Pt advised to take medications as prescribed and follow up with providers as recommended.  Pt thanked Therapist, sports for call.

## 2020-03-11 ENCOUNTER — Other Ambulatory Visit: Payer: Self-pay

## 2020-03-11 ENCOUNTER — Ambulatory Visit: Payer: Medicare Other | Admitting: Sports Medicine

## 2020-03-11 ENCOUNTER — Encounter: Payer: Self-pay | Admitting: Sports Medicine

## 2020-03-11 DIAGNOSIS — Z7901 Long term (current) use of anticoagulants: Secondary | ICD-10-CM | POA: Diagnosis not present

## 2020-03-11 DIAGNOSIS — M79674 Pain in right toe(s): Secondary | ICD-10-CM | POA: Diagnosis not present

## 2020-03-11 DIAGNOSIS — L602 Onychogryphosis: Secondary | ICD-10-CM | POA: Diagnosis not present

## 2020-03-11 DIAGNOSIS — L608 Other nail disorders: Secondary | ICD-10-CM

## 2020-03-11 MED ORDER — AMOXICILLIN-POT CLAVULANATE 875-125 MG PO TABS
1.0000 | ORAL_TABLET | Freq: Two times a day (BID) | ORAL | 0 refills | Status: DC
Start: 2020-03-11 — End: 2021-09-08

## 2020-03-11 MED ORDER — NEOMYCIN-POLYMYXIN-HC 3.5-10000-1 OT SOLN
OTIC | 0 refills | Status: DC
Start: 2020-03-11 — End: 2020-08-27

## 2020-03-11 NOTE — Progress Notes (Signed)
Subjective: Stacy [name] is a 86 y.o. female patient presents to office today complaining of a bleeding toenail and lifting of the nail for the last 2 years but every since been on Eliquis has bled off and on with most bleeding last night and this morning. Patient has treated this by paper towel. Patient denies fever/chills/nausea/vomitting/any other related constitutional symptoms at this time.  Review of Systems  All other systems reviewed and are negative.    Patient Active Problem List   Diagnosis Date Noted  . Chronic diastolic heart failure ([place]) 05/02/2017  . Spider veins of both lower extremities 01/11/2017  . Essential hypertension 01/03/2017  . HOCM (hypertrophic obstructive cardiomyopathy) ([place]) 09/27/2016  . Persistent atrial fibrillation ([place]) 09/09/2016  . GERD (gastroesophageal reflux disease) 09/09/2016  . Rheumatoid arthritis ([place]) 09/09/2016  . Mitral regurgitation 09/09/2016  . Elevated troponin 09/09/2016  . Rotator cuff tear 06/28/2011  . Shoulder pain 06/28/2011  . Hypertensive heart disease without CHF 06/02/2011  . LVH (left ventricular hypertrophy) 06/02/2011  . Hyperlipidemia 06/02/2011  . History of total hip arthroplasty 05/27/2011    Current Outpatient Medications on File Prior to Visit  Medication Sig Dispense Refill  . amiodarone (PACERONE) 200 MG tablet Take 0.5 tablets (100 mg total) by mouth daily. 45 tablet 3  . apixaban (ELIQUIS) 5 MG TABS tablet Take 1 tablet (5 mg total) by mouth 2 (two) times daily. 60 tablet 0  . diltiazem (CARDIZEM CD) 180 MG 24 hr capsule Take 1 capsule (180 mg total) by mouth daily. 30 capsule 0  . folic acid (FOLVITE) 1 MG tablet Take 3 mg by mouth daily.     . furosemide (LASIX) 20 MG tablet Take 20 mg by mouth every Monday, Wednesday, and Friday.     . hydrALAZINE (APRESOLINE) 25 MG tablet Take 1 tablet 3 times daily if BP >140 (Patient taking differently: Take 25 mg by mouth 3 (three) times daily as needed (BP >70).  ) 270 tablet 3  . irbesartan (AVAPRO) 300 MG tablet TAKE ONE (1) TABLET BY MOUTH ONCE DAILY (Patient taking differently: Take 300 mg by mouth daily. ) 90 tablet 2  . meclizine (ANTIVERT) 25 MG tablet Take 25 mg by mouth daily as needed for dizziness.     . methotrexate (RHEUMATREX) 2.5 MG tablet Take 15 mg by mouth every Saturday.     . metoprolol tartrate (LOPRESSOR) 25 MG tablet Take 25-50 mg by mouth See admin instructions. Take 2 tablets (50mg ) by mouth every morning and 1 tablet (25mg ) by mouth every evening.    Marland Kitchen omeprazole (PRILOSEC) 20 MG capsule Take 20 mg by mouth 2 (two) times daily as needed (indigestion, heartburn).     . potassium chloride (KLOR-CON) 10 MEQ tablet TAKE 1 TABLET BY MOUTH THREE TIMES A WEEK (Patient taking differently: Take 10 mEq by mouth every Monday, Wednesday, and Friday. At night) 45 tablet 2  . PROAIR RESPICLICK 878 (90 Base) MCG/ACT AEPB Inhale 2 puffs into the lungs every 6 (six) hours as needed (wheezing, shortnes of breath).     Marland Kitchen rOPINIRole (REQUIP) 1 MG tablet Take 1 mg by mouth at bedtime.     . traMADol (ULTRAM) 50 MG tablet Take 50 mg by mouth daily as needed for severe pain.      No current facility-administered medications on file prior to visit.    Allergies  Allergen Reactions  . Sulfa Antibiotics Nausea Only    Objective:  There were no vitals filed for this  visit.  General: Well developed, nourished, in no acute distress, alert and oriented x3   Dermatology: Skin is warm, dry and supple bilateral. Right hallux nail appears to be partially detached with 100% hematoma of the nailbed and severe thickening of the hallux nail with incurvation. (+) Erythema. (+) Edema. (+) Bloody drainagedrainage present. The remaining nails are short and thickened but currently asymptomatic.  Vascular: Dorsalis Pedis artery and Posterior Tibial artery pedal pulses are 1/4 bilateral with immedate capillary fill time.  Minimal pedal hair growth present. No  significant lower extremity edema.  Mild varicosities bilateral.  Neruologic: Grossly intact via light touch bilateral.  Musculoskeletal: Tenderness to palpation of the right hallux. Muscular strength within normal limits in all groups bilateral.   Assesement and Plan: Problem List Items Addressed This Visit    None    Visit Diagnoses    Nail hemorrhage    -  Primary   Onychogryposis of toenail       Toe pain, right       Current use of anticoagulant therapy          -Discussed treatment alternatives and plan of care; Explained permanent/temporary nail avulsion and post procedure course to patient. Patient elects for permanent removal of right hallux toenail and evacuation of hematoma - After a verbal and written consent, injected 3 ml of a 50:50 mixture of 2% plain  lidocaine and 0.5% plain marcaine in a normal hallux block fashion. Next, a  betadine prep was performed. Anesthesia was tested and found to be appropriate.  The offending right hallux toenail was completely incised from the hyponychium to the epinychium. The nail was removed and cleared from the field. The area was curretted for any remaining nail or spicules and removal of hematoma, there was no active bleeders in the nail bed, Lumicaine was applied followed by Phenol application performed and the area was then flushed with alcohol and dressed with antibiotic cream and a dry sterile dressing. -Patient was instructed to leave the dressing intact for today and begin soaking  in a weak solution of betadine or Epsom salt and water tomorrow. Patient was instructed to  soak for 15-20 minutes each day and apply neosporin/corticosporin and a gauze or bandaid dressing each day. -Patient was instructed to monitor the toe for signs of infection and return to office if toe becomes red, hot or swollen. -Advised ice, elevation, and tylenol or motrin if needed for pain.  -Discussed with patient and Dr. Joylene Draft eliqius who was given the ok  to hold for the next day or two to ensure bleeding stops  -Patient is to return in 2 weeks for follow up care/nail check or sooner if problems arise.  Landis Martins, DPM

## 2020-03-11 NOTE — Patient Instructions (Signed)

## 2020-03-19 ENCOUNTER — Ambulatory Visit: Payer: Medicare Other | Admitting: Sports Medicine

## 2020-03-20 ENCOUNTER — Telehealth: Payer: Self-pay | Admitting: Cardiovascular Disease

## 2020-03-20 NOTE — Telephone Encounter (Signed)
Pt c/o BP issue: STAT if pt c/o blurred vision, one-sided weakness or slurred speech  1. What are your last 5 BP readings?  03/17/20: 128/59 HR: 55 03/14/20: 128/59 HR: 57 03/10/20: 128/57 HR: 60   2. Are you having any other symptoms (ex. Dizziness, headache, blurred vision, passed out)? No additional symptoms reported.  3. What is your BP issue? 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 Acie Fredrickson aware that the patient's diastolic BP number has been below 60. She states she would like a call back to discuss. Christina's call back number is [phone] (ext: [phone]).

## 2020-03-20 NOTE — Telephone Encounter (Signed)
Left message to call back  

## 2020-03-25 ENCOUNTER — Ambulatory Visit: Payer: Medicare Other | Admitting: Cardiovascular Disease

## 2020-03-26 ENCOUNTER — Encounter: Payer: Self-pay | Admitting: Cardiovascular Disease

## 2020-03-26 NOTE — Progress Notes (Signed)
Stacy [name] Date of Birth  31-Jul-1928 [place] [address]    [place], [place]  [address]    [place],   [address] [phone]  Fax  [phone]  [phone]  Fax [phone]  Problems: 1. 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 Hyperlipidemia 3. Mitral regurgitation 4. Paroxysmal atrial fib (diagnosed Feb. 2018 )  CHads2VASc 5 ( female  age 86, HTN, diastolic CHF)     previous notes   Stacy [name] is done very well from a cardiac standpoint. She's not had any episodes of chest pain or shortness of breath. She's had a cough and bronchitis for the past several weeks. She denies any syncope or presyncope. She denies any chest pain.  Her blood pressure has been well-controlled on. She's not had any problems.   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  April 10 , 2014:  No chest pain.  No dyspnea.  BP has been ok.  She gets some exercise - not as much as she should - also has orthopedic issues that limit her.   Sept. 15, 2014:  She has a mild dynamic  LVOT obstruction.   We increased her metoprolol at the last visit and she is doing well.  No dizziness.  She does have some vertigo and takes meclizine.  Sept. 17, 2015: Doing well.  No CP or dyspnea.   Exercising regularly.  Sept. 23, 2016:  Doing well from a cardiac standpoint Had an episode of vertigo a week ago .  Lasted for several minutes.  Did not go to the ER.  Symptoms have resolved completely   October 14, 2015: Stacy [name] has a dynamic LVOT obstruction that we are managing with metoprolol.   Her HR has been a bit slow She is off the  HCTZ .   i've advised her to avoid being volume depleted.  Has had some dizziness and headaches recently  Taking metoprolol 50 mg in the am , 25 mg in the evening   January 03, 2017:  Stacy [name] is seen today for follow up visit She was found to have  Atrial fib with RVR ( FEb. 21-23 hospitalization) and has been started on Eliquis  She has converted back to NSR / sinus bradycardia  Echo shows normal LV systolic function, grade 2 diastolic dysfunction, mild AS   Has not been taking her Eliquis - having occasional bloody nose. Has some bleeding under her right great toe.  BP is typically better at home   September 26, 2017   Stacy [name] is an 86 year old female with a history of paroxysmal atrial fibrillation, hypertrophic obstructive cardiomyopathy, mitral regurgitation, hyperlipidemia and diastolic heart failure.  She was seen in October, 2018 by Richardson Dopp, PA.  Avoids salt.  Has some dyspnea.  Able to work out regularly .   Has some DOE with exercise  Has not taken her meds - BP is a bit elevated today  She was on Lasix and kdur.   These were stopped by Dr. Joylene Draft  -possibly because of her dynamic outflow tract obstruction. Has some leg swelling    January 02, 2018:  Stacy [name] is seen back today for follow-up of her chronic diastolic congestive heart failure and her LVOT obstruction.  We added Lasix 20 mg 3 times a week as well as potassium chloride 10 mEq 3 times a week. She  is feeling much better at this point.   No syncope . Getting regular exercise   April 03, 2018:  Stacy [name] is seen back today for follow-up visit.  She has had some volume overload.  She has normal left ventricular systolic function.  In the past we have been concerned about dynamic LVOT obstruction but recent echocardiograms have not demonstrated a significant LVOT obstruction.  She feels fine.  She is part of a program that has home health nurses monitor   her weight.  She recently gained several pounds in 1 day and we  were alerted.  She takes Lasix 20 mg 3 days a week but notes that she really does not get much urine output today after taking the Lasix 20 mg tablets. She is not short of breath.  She has not had any leg edema.  She really does not feel any  different today compared to last week with a week before.  Dec. 16, 2019: Stacy [name] is seen back today for work in visit.  She was recently seen by her primary medical doctor and was markedly hypertensive. She did not eat any extra salt. He increased the Lasix and Kdur to 5 times a week . Now her BP readings are great.  shes tolerating the increased meds.   Sept. 11, 2020  Stacy [name] is seen today for follow up of her PAF, HTN, HOCM .  Wakes up with headaches  BP looks great.  Stays active.  Works in her yard, garden, housework  No syncope,   September 27, 2019:  22 seen today for follow-up of her paroxysmal atrial fibrillation, hypertension mild hypertrophic obstructive cardiomyopathy. No CP ,  Occasional dyspnea.  No syncope  Walks , works in her garden regularly ,  Does her own housework   Sept, 9, 2021: Stacy [name] is seen today for follow up of her PAF, HTN, mild HOCM. She checks her BP daily , is frequently a bit elevate.  Avoids salt   Was hospitalized a month ago.   Had syncope due to a UTI . Feeling great   Wt Readings from Last 3 Encounters:  03/27/20 147 lb 6.4 oz (66.9 kg)  02/26/20 141 lb (64 kg)  09/27/19 146 lb 4 oz (66.3 kg)    Current Outpatient Medications on File Prior to Visit  Medication Sig Dispense Refill  . amiodarone (PACERONE) 200 MG tablet Take 0.5 tablets (100 mg total) by mouth daily. 45 tablet 3  . amoxicillin-clavulanate (AUGMENTIN) 875-125 MG tablet Take 1 tablet by mouth 2 (two) times daily. 28 tablet 0  . apixaban (ELIQUIS) 5 MG TABS tablet Take 1 tablet (5 mg total) by mouth 2 (two) times daily. 60 tablet 0  . buPROPion (WELLBUTRIN SR) 100 MG 12 hr tablet Take 100 mg by mouth every morning.    . diltiazem (CARDIZEM CD) 180 MG 24 hr capsule Take 1 capsule (180 mg total) by mouth daily. 30 capsule 0  . folic acid (FOLVITE) 1 MG tablet Take 3 mg by mouth daily.     . furosemide (LASIX) 20 MG tablet Take 20 mg by mouth every Monday, Wednesday, and  Friday.     . hydrALAZINE (APRESOLINE) 25 MG tablet Take 1 tablet 3 times daily if BP >140 270 tablet 3  . irbesartan (AVAPRO) 300 MG tablet TAKE ONE (1) TABLET BY MOUTH ONCE DAILY 90 tablet 2  . meclizine (ANTIVERT) 25 MG tablet Take 25 mg by mouth daily as needed for dizziness.     Marland Kitchen  methotrexate (RHEUMATREX) 2.5 MG tablet Take 15 mg by mouth every Saturday.     . metoprolol tartrate (LOPRESSOR) 25 MG tablet Take 25-50 mg by mouth See admin instructions. Take 2 tablets (50mg ) by mouth every morning and 1 tablet (25mg ) by mouth every evening.    . neomycin-polymyxin-hydrocortisone (CORTISPORIN) OTIC solution Apply 2-3 drops to the ingrown toenail site twice daily. Cover with band-aid. 10 mL 0  . omeprazole (PRILOSEC) 20 MG capsule Take 20 mg by mouth 2 (two) times daily as needed (indigestion, heartburn).     . potassium chloride (KLOR-CON) 10 MEQ tablet TAKE 1 TABLET BY MOUTH THREE TIMES A WEEK 45 tablet 2  . PROAIR RESPICLICK 735 (90 Base) MCG/ACT AEPB Inhale 2 puffs into the lungs every 6 (six) hours as needed (wheezing, shortnes of breath).     Marland Kitchen rOPINIRole (REQUIP) 1 MG tablet Take 1 mg by mouth at bedtime.     . traMADol (ULTRAM) 50 MG tablet Take 50 mg by mouth daily as needed for severe pain.      No current facility-administered medications on file prior to visit.    Allergies  Allergen Reactions  . Sulfa Antibiotics Nausea Only    Past Medical History:  Diagnosis Date  . Bowel obstruction ([facility])   . Diastolic dysfunction   . Gallbladder disease   . GERD (gastroesophageal reflux disease)   . HOCM (hypertrophic obstructive cardiomyopathy) ([facility]) 09/27/2016   Echo 10/16: severe LVH, EF 55-60, peak 32, no RWMA, +SAM, mild to mod MR, severe LAE, PASP 28 // Echo 2/18: mod conc LVH, severe septal LVH c/w HCM, EF 60-65, peak LVOT 94, no RWMA, + SAM, severe MR, severe LAE, PASP 25 // Echo 6/18: severe conc LVH, EF 60-65, noRWMA, Gr 2 DD, peak LVOT gradient 20, mild aortic stenosis, mild  AI, mild MR, severe LAE, mild TR, PASP 33   . Hypertension   . LVH (left ventricular hypertrophy)    WITH ASYMMETRIC SEPTAL HYPERTROPHY  . Mitral regurgitation 09/09/2016   Severe by echo 08/2016  . Persistent atrial fibrillation ([facility]) 09/09/2016  . Rheumatoid arthritis [facility])     Past Surgical History:  Procedure Laterality Date  . CARDIOVASCULAR STRESS TEST  03/10/2006   EF 69%. NO EVIDENCE OF ISCHEMIA  . CARDIOVERSION N/A 03/21/2018   Procedure: Cancelled Cardioversion;  Surgeon: Thayer Headings, MD;  Location: [facility];  Service: Cardiovascular;  Laterality: N/A;  . CHOLECYSTECTOMY    . TOTAL HIP ARTHROPLASTY    . US ECHOCARDIOGRAPHY  02/21/2008   EF 55-60%  . US ECHOCARDIOGRAPHY  07/21/2005   EF 55-60%    Social History   Tobacco Use  Smoking Status Never Smoker  Smokeless Tobacco Never Used    Social History   Substance and Sexual Activity  Alcohol Use No    Family History  Problem Relation Age of Onset  . Leukemia Father   . Heart attack Brother   . Hypertension Mother   . Stroke Mother   . Breast cancer Daughter     Reviw of Systems:  Reviewed in the HPI.  All other systems are negative.  Physical Exam: Blood pressure 140/76, pulse (!) 56, height 5\' 2"  (1.575 m), weight 147 lb 6.4 oz (66.9 kg), SpO2 96 %.  GEN:  Elderly female,  Looks great  HEENT: Normal NECK: No JVD; No carotid bruits LYMPHATICS: No lymphadenopathy CARDIAC: RRR , 2/6 systolic murmur  RESPIRATORY:  Clear to auscultation without rales, wheezing or rhonchi  ABDOMEN: Soft, non-tender,  non-distended MUSCULOSKELETAL:  No edema; No deformity  SKIN: Warm and dry NEUROLOGIC:  Alert and oriented x 3   ECG:    Assessment / Plan:   1. Paroxysmal atrial fib : CHADS2VASC score 5  ( female, age , HTN, CHF)  Is in NSR   2.  Chronic diastolic congestive heart failure:   No symptoms of CHF    2. Hyperlipidemia  -    3. Mitral regurgitation-   Stable   4.  Essential hypertension:   BP  is normal after recheck .  Cont same meds.   4 dynamic left ventricular obstruction stable.  Murmur is stble     [name], MD  03/27/2020 12:24 PM    [place] [address],  [place], [place]  [address] Pager [phone] Phone: [phone]; Fax: [phone]

## 2020-03-27 ENCOUNTER — Other Ambulatory Visit: Payer: Self-pay

## 2020-03-27 ENCOUNTER — Encounter: Payer: Self-pay | Admitting: Cardiovascular Disease

## 2020-03-27 ENCOUNTER — Ambulatory Visit: Payer: Medicare Other | Admitting: Cardiovascular Disease

## 2020-03-27 VITALS — BP 110/65 | HR 56 | Ht 62.0 in | Wt 147.4 lb

## 2020-03-27 DIAGNOSIS — I10 Essential (primary) hypertension: Secondary | ICD-10-CM | POA: Diagnosis not present

## 2020-03-27 DIAGNOSIS — I421 Obstructive hypertrophic cardiomyopathy: Secondary | ICD-10-CM | POA: Diagnosis not present

## 2020-03-27 NOTE — Patient Instructions (Signed)
Medication Instructions:  Your physician recommends that you continue on your current medications as directed. Please refer to the Current Medication list given to you today.  *If you need a refill on your cardiac medications before your next appointment, please call your pharmacy*   Lab Work: None  If you have labs (blood work) drawn today and your tests are completely normal, you will receive your results only by: . MyChart Message (if you have MyChart) OR . A paper copy in the mail If you have any lab test that is abnormal or we need to change your treatment, we will call you to review the results.   Testing/Procedures: None   Follow-Up: At [facility], you and your health needs are our priority.  As part of our continuing mission to provide you with exceptional heart care, we have created designated Provider Care Teams.  These Care Teams include your primary Cardiologist (physician) and Advanced Practice Providers (APPs -  Physician Assistants and Nurse Practitioners) who all work together to provide you with the care you need, when you need it.  We recommend signing up for the patient portal called "MyChart".  Sign up information is provided on this After Visit Summary.  MyChart is used to connect with patients for Virtual Visits (Telemedicine).  Patients are able to view lab/test results, encounter notes, upcoming appointments, etc.  Non-urgent messages can be sent to your provider as well.   To learn more about what you can do with MyChart, go to [email or web address].    Your next appointment:   12 month(s)  The format for your next appointment:   In Person  Provider:   You may see Philip Nahser, MD or one of the following Advanced Practice Providers on your designated Care Team:    Scott Weaver, PA-C  Vin Bhagat, PA-C    Other Instructions None  

## 2020-04-10 ENCOUNTER — Other Ambulatory Visit: Payer: Self-pay | Admitting: Cardiovascular Disease

## 2020-04-15 ENCOUNTER — Ambulatory Visit: Payer: Medicare Other | Admitting: Sports Medicine

## 2020-06-19 ENCOUNTER — Telehealth: Payer: Self-pay | Admitting: Cardiovascular Disease

## 2020-06-19 NOTE — Telephone Encounter (Signed)
I agree with note by Dala Dock, RN She should take her hydralazine 25 mg 3 times a day . Watch her salt ( prepared meals, processed meats, take out meals) Her BP is not high enough to suggest that her headaches are coming from her BP She should consult with her primary about her headaches

## 2020-06-19 NOTE — Telephone Encounter (Signed)
Spoke with the patient who states that she has been having headaches and has been more short of breath than usual. Denies chest pain.  Patient reports that she is taking medications as prescribed.  She reports that she does take her hydralazine but only once per day. I advised her that she can take hydralazine 25mg   up to three times per day if systolic remains above 844. Patient verbalized understanding. She will continue to monitor blood pressure

## 2020-06-19 NOTE — Telephone Encounter (Signed)
[employer] is calling to give BP readings  167/66 169/74 160/65 152/61

## 2020-06-19 NOTE — Telephone Encounter (Signed)
Attempted to call patient. Unable to leave voicemail.  

## 2020-06-20 NOTE — Telephone Encounter (Signed)
Attempted to call patient back, unable to leave a voicemail

## 2020-07-07 ENCOUNTER — Telehealth: Payer: Self-pay | Admitting: Cardiovascular Disease

## 2020-07-07 NOTE — Telephone Encounter (Signed)
Pt states her SOB has been worsening over the last month.  Denies swelling.  Occurs mostly with exertion.  For the first time, had issue when lying down last night.  Weight is stable at 144lbs.  Wears compression stockings daily.  Denies increased salt in diet.  Denies CP, lightheadedness or dizziness. Most recent BPs are 123/55 and 157/66.  Took BP while on the phone with me and it was 128/61, 69.  HR always in the 60s. Pt very concerned and would like to be seen.  Scheduled first available on 07/22/20.  Advised I will send message to Dr. Acie Fredrickson for review to see if any recommendations in the meantime.

## 2020-07-07 NOTE — Telephone Encounter (Signed)
Pt c/o Shortness Of Breath: STAT if SOB developed within the last 24 hours or pt is noticeably SOB on the phone  1. Are you currently SOB (can you hear that pt is SOB on the phone)? No   2. How long have you been experiencing SOB? A while, but has worsened over past month.   3. Are you SOB when sitting or when up moving around? Moving around   4. Are you currently experiencing any other symptoms? No   Stacy [name] is requesting an appointment in regards to this. Please advise.

## 2020-07-08 NOTE — Telephone Encounter (Signed)
Keep apt. As scheduled.   She should go the the ER if she acutely worsens .

## 2020-07-08 NOTE — Telephone Encounter (Signed)
Spoke with pt and made her aware Dr. Acie Fredrickson wants her to keep appt but go to ED in the meantime if anything worsens.  Pt verbalized understanding and was appreciative for call.

## 2020-07-20 ENCOUNTER — Encounter: Payer: Self-pay | Admitting: Cardiovascular Disease

## 2020-07-20 NOTE — Progress Notes (Signed)
Stacy [name] Date of Birth  08/25/1928 [facility]      [address]    [address]    [place], [place]  [address]       Problems: 1. 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 Hyperlipidemia 3. Mitral regurgitation 4. Paroxysmal atrial fib (diagnosed Feb. 2018 )  CHads2VASc 5 ( female  age 87, HTN, diastolic CHF)     previous notes   Stacy [name] is done very well from a cardiac standpoint. She's not had any episodes of chest pain or shortness of breath. She's had a cough and bronchitis for the past several weeks. She denies any syncope or presyncope. She denies any chest pain.  Her blood pressure has been well-controlled on. She's not had any problems.   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  April 10 , 2014:  No chest pain.  No dyspnea.  BP has been ok.  She gets some exercise - not as much as she should - also has orthopedic issues that limit her.   Sept. 15, 2014:  She has a mild dynamic  LVOT obstruction.   We increased her metoprolol at the last visit and she is doing well.  No dizziness.  She does have some vertigo and takes meclizine.  Sept. 17, 2015: Doing well.  No CP or dyspnea.   Exercising regularly.  Sept. 23, 2016:  Doing well from a cardiac standpoint Had an episode of vertigo a week ago .  Lasted for several minutes.  Did not go to the ER.  Symptoms have resolved completely   October 14, 2015: Stacy [name] has a dynamic LVOT obstruction that we are managing with metoprolol.   Her HR has been a bit slow She is off the  HCTZ .   i've advised her to avoid being volume depleted.  Has had some dizziness and headaches recently  Taking metoprolol 50 mg in the am , 25 mg in the evening   January 03, 2017:  Stacy [name] is seen today for follow up visit She was found to have Atrial fib with RVR ( FEb. 21-23 hospitalization) and has been started on Eliquis  She has converted back to NSR / sinus  bradycardia  Echo shows normal LV systolic function, grade 2 diastolic dysfunction, mild AS   Has not been taking her Eliquis - having occasional bloody nose. Has some bleeding under her right great toe.  BP is typically better at home   September 26, 2017   Stacy [name] is an 87 year old female with a history of paroxysmal atrial fibrillation, hypertrophic obstructive cardiomyopathy, mitral regurgitation, hyperlipidemia and diastolic heart failure.  She was seen in October, 2018 by Richardson Dopp, PA.  Avoids salt.  Has some dyspnea.  Able to work out regularly .   Has some DOE with exercise  Has not taken her meds - BP is a bit elevated today  She was on Lasix and kdur.   These were stopped by Dr. Joylene Draft  -possibly because of her dynamic outflow tract obstruction. Has some leg swelling    January 02, 2018:  Stacy [name] is seen back today for follow-up of her chronic diastolic congestive heart failure and her LVOT obstruction.  We added Lasix Stacy [name] mg 3 times a week as well as potassium chloride 10 mEq 3 times a week. She is feeling much better at this point.   No syncope . Getting regular exercise  April 03, 2018:  Stacy [name] is seen back today for follow-up visit.  She has had some volume overload.  She has normal left ventricular systolic function.  In the past we have been concerned about dynamic LVOT obstruction but recent echocardiograms have not demonstrated a significant LVOT obstruction.  She feels fine.  She is part of a program that has home health nurses monitor   her weight.  She recently gained several pounds in 1 day and we  were alerted.  She takes Lasix Stacy [name] mg 3 days a week but notes that she really does not get much urine output today after taking the Lasix Stacy [name] mg tablets. She is not short of breath.  She has not had any leg edema.  She really does not feel any different today compared to last week with a week before.  Dec. 16, 2019: Stacy [name] is seen back today for work in visit.   She was recently seen by her primary medical doctor and was markedly hypertensive. She did not eat any extra salt. He increased the Lasix and Kdur to 5 times a week . Now her BP readings are great.  shes tolerating the increased meds.   Sept. 11, 2020  Stacy [name] is seen today for follow up of her PAF, HTN, HOCM .  Wakes up with headaches  BP looks great.  Stays active.  Works in her yard, garden, housework  No syncope,   September 27, 2019:  Stacy [name] seen today for follow-up of her paroxysmal atrial fibrillation, hypertension mild hypertrophic obstructive cardiomyopathy. No CP ,  Occasional dyspnea.  No syncope  Walks , works in her garden regularly ,  Does her own housework   Sept, 9, 2021: Stacy [name] is seen today for follow up of her PAF, HTN, mild HOCM. She checks her BP daily , is frequently a bit elevate.  Avoids salt   Was hospitalized a month ago.   Had syncope due to a UTI . Feeling great   Jan. 4, 2022: Stacy [name] is seen today for evaluation of worsening dyspnea for the past month.  HR is slow today . She has 1st degree AV block , she is on amio 100 mg a day, cardizem 180 mg d day , Metoprolol 50 mg in AM, 25 mg in PM .  She fell 2 times over the past several months.  No syncope,  Just lost her balance.     Wt Readings from Last 3 Encounters:  03/27/20 147 lb 6.4 oz (66.9 kg)  02/26/20 141 lb (64 kg)  09/27/19 146 lb 4 oz (66.3 kg)    Current Outpatient Medications on File Prior to Visit  Medication Sig Dispense Refill  . amiodarone (PACERONE) 200 MG tablet Take 0.5 tablets (100 mg total) by mouth daily. 45 tablet 3  . amoxicillin-clavulanate (AUGMENTIN) 875-125 MG tablet Take 1 tablet by mouth 2 (two) times daily. 28 tablet 0  . apixaban (ELIQUIS) 5 MG TABS tablet Take 1 tablet (5 mg total) by mouth 2 (two) times daily. 60 tablet 0  . buPROPion (WELLBUTRIN SR) 100 MG 12 hr tablet Take 100 mg by mouth every morning.    . diltiazem (CARDIZEM CD) 180 MG 24 hr capsule Take 1  capsule (180 mg total) by mouth daily. 30 capsule 0  . folic acid (FOLVITE) 1 MG tablet Take 3 mg by mouth daily.     . furosemide (LASIX) Stacy [name] MG tablet Take Stacy [name] mg by mouth every Monday, Wednesday, and Friday.     Marland Kitchen  hydrALAZINE (APRESOLINE) 25 MG tablet Take 1 tablet 3 times daily if BP >140 270 tablet 3  . irbesartan (AVAPRO) 300 MG tablet TAKE ONE (1) TABLET BY MOUTH ONCE DAILY 90 tablet 2  . meclizine (ANTIVERT) 25 MG tablet Take 25 mg by mouth daily as needed for dizziness.     . methotrexate (RHEUMATREX) 2.5 MG tablet Take 15 mg by mouth every Saturday.     . metoprolol tartrate (LOPRESSOR) 25 MG tablet Take 25-50 mg by mouth See admin instructions. Take 2 tablets (50mg ) by mouth every morning and 1 tablet (25mg ) by mouth every evening.    . neomycin-polymyxin-hydrocortisone (CORTISPORIN) OTIC solution Apply 2-3 drops to the ingrown toenail site twice daily. Cover with band-aid. 10 mL 0  . omeprazole (PRILOSEC) Stacy [name] MG capsule Take Stacy [name] mg by mouth 2 (two) times daily as needed (indigestion, heartburn).     . potassium chloride (KLOR-CON) 10 MEQ tablet TAKE 1 TABLET BY MOUTH THREE TIMES A WEEK 45 tablet 2  . PROAIR RESPICLICK 123XX123 (90 Base) MCG/ACT AEPB Inhale 2 puffs into the lungs every 6 (six) hours as needed (wheezing, shortnes of breath).     Marland Kitchen rOPINIRole (REQUIP) 1 MG tablet Take 1 mg by mouth at bedtime.     . traMADol (ULTRAM) 50 MG tablet Take 50 mg by mouth daily as needed for severe pain.      No current facility-administered medications on file prior to visit.    Allergies  Allergen Reactions  . Sulfa Antibiotics Nausea Only    Past Medical History:  Diagnosis Date  . Bowel obstruction ([place])   . Diastolic dysfunction   . Gallbladder disease   . GERD (gastroesophageal reflux disease)   . HOCM (hypertrophic obstructive cardiomyopathy) ([place]) 09/27/2016   Echo 10/16: severe LVH, EF 55-60, peak 32, no RWMA, +SAM, mild to mod MR, severe LAE, PASP 28 // Echo 2/18: mod conc LVH,  severe septal LVH c/w HCM, EF 60-65, peak LVOT 94, no RWMA, + SAM, severe MR, severe LAE, PASP 25 // Echo 6/18: severe conc LVH, EF 60-65, noRWMA, Gr 2 DD, peak LVOT gradient Stacy [name], mild aortic stenosis, mild AI, mild MR, severe LAE, mild TR, PASP 33   . Hypertension   . LVH (left ventricular hypertrophy)    WITH ASYMMETRIC SEPTAL HYPERTROPHY  . Mitral regurgitation 09/09/2016   Severe by echo 08/2016  . Persistent atrial fibrillation ([employer]) 09/09/2016  . Rheumatoid arthritis [facility])     Past Surgical History:  Procedure Laterality Date  . CARDIOVASCULAR STRESS TEST  03/10/2006   EF 69%. NO EVIDENCE OF ISCHEMIA  . CARDIOVERSION N/A 03/21/2018   Procedure: Cancelled Cardioversion;  Surgeon: Thayer Headings, MD;  Location: [facility];  Service: Cardiovascular;  Laterality: N/A;  . CHOLECYSTECTOMY    . TOTAL HIP ARTHROPLASTY    . US ECHOCARDIOGRAPHY  02/21/2008   EF 55-60%  . US ECHOCARDIOGRAPHY  07/21/2005   EF 55-60%    Social History   Tobacco Use  Smoking Status Never Smoker  Smokeless Tobacco Never Used    Social History   Substance and Sexual Activity  Alcohol Use No    Family History  Problem Relation Age of Onset  . Leukemia Father   . Heart attack Brother   . Hypertension Mother   . Stroke Mother   . Breast cancer Daughter     Reviw of Systems:  Reviewed in the HPI.  All other systems are negative.  Physical Exam: There were no vitals taken  for this visit.  GEN:  Elderly female,  NAD  HEENT: Normal NECK: No JVD; No carotid bruits LYMPHATICS: No lymphadenopathy CARDIAC: RRR , bradycardia , soft systolic murmur  RESPIRATORY:  Clear to auscultation without rales, wheezing or rhonchi  ABDOMEN: Soft, non-tender, non-distended MUSCULOSKELETAL:  No edema; No deformity  SKIN: Warm and dry NEUROLOGIC:  Alert and oriented x 3    ECG: July 22, 2020: Sinus bradycardia 50.  She has a first-degree AV block.  Assessment / Plan:   1. Paroxysmal atrial fib : CHADS2VASC  score 5  ( female, age , HTN, CHF)  Continue Eliquis.  Continue amiodarone 100 mg a day.  We will check a TSH and basic metabolic profile today.  Her CBC was checked in August and her hemoglobin is stable.    2.  Chronic diastolic congestive heart failure:     Continue current dose of   Lasix.  Because of her slow heart rate we need to discontinue the diltiazem and reduce the dose of metoprolol.   2. Hyperlipidemia  - managed by her primary MD   3. Mitral regurgitation-    Stable   4.  Essential hypertension:    Cont current meds.   4 dynamic left ventricular obstruction stable.   HR is slow.  Reduced dose of metoprolol, DC diltiazem.     Kristeen Miss, MD  07/20/2020 3:39 PM    [facility] [address] [place],  [address] [place], [place]  [address] Pager [phone] Phone: [phone]; Fax: [phone]

## 2020-07-22 ENCOUNTER — Ambulatory Visit: Payer: Medicare Other | Admitting: Cardiovascular Disease

## 2020-07-22 ENCOUNTER — Encounter: Payer: Self-pay | Admitting: Cardiovascular Disease

## 2020-07-22 ENCOUNTER — Other Ambulatory Visit: Payer: Self-pay

## 2020-07-22 VITALS — BP 162/76 | HR 50 | Ht 62.0 in | Wt 149.0 lb

## 2020-07-22 DIAGNOSIS — I5032 Chronic diastolic (congestive) heart failure: Secondary | ICD-10-CM | POA: Diagnosis not present

## 2020-07-22 DIAGNOSIS — I480 Paroxysmal atrial fibrillation: Secondary | ICD-10-CM | POA: Insufficient documentation

## 2020-07-22 DIAGNOSIS — I421 Obstructive hypertrophic cardiomyopathy: Secondary | ICD-10-CM

## 2020-07-22 DIAGNOSIS — I4891 Unspecified atrial fibrillation: Secondary | ICD-10-CM | POA: Insufficient documentation

## 2020-07-22 DIAGNOSIS — I4819 Other persistent atrial fibrillation: Secondary | ICD-10-CM

## 2020-07-22 LAB — BASIC METABOLIC PANEL
BUN/Creatinine Ratio: 20 (ref 12–28)
BUN: 18 mg/dL (ref 8–27)
CO2: 22 mmol/L (ref 20–29)
Calcium: 9.6 mg/dL (ref 8.7–10.3)
Chloride: 101 mmol/L (ref 96–106)
Creatinine, Ser: 0.91 mg/dL (ref 0.57–1.00)
GFR calc Af Amer: 65 mL/min/{1.73_m2} (ref >59)
GFR calc non Af Amer: 56 mL/min/{1.73_m2} — ABNORMAL LOW (ref >59)
Glucose: 83 mg/dL (ref 65–99)
Potassium: 4 mmol/L (ref 3.5–5.2)
Sodium: 138 mmol/L (ref 134–144)

## 2020-07-22 LAB — TSH: TSH: 2.54 u[IU]/mL (ref 0.450–4.500)

## 2020-07-22 MED ORDER — AMIODARONE HCL 200 MG PO TABS: 100.0000 mg | ORAL_TABLET | Freq: Every day | ORAL | 3 refills | Status: DC

## 2020-07-22 MED ORDER — POTASSIUM CHLORIDE ER 10 MEQ PO TBCR: 10.0000 meq | EXTENDED_RELEASE_TABLET | ORAL | 3 refills | Status: DC

## 2020-07-22 MED ORDER — HYDRALAZINE HCL 25 MG PO TABS: ORAL_TABLET | ORAL | 3 refills | Status: AC

## 2020-07-22 MED ORDER — METOPROLOL TARTRATE 25 MG PO TABS: 25.0000 mg | ORAL_TABLET | Freq: Two times a day (BID) | ORAL | 3 refills | Status: DC

## 2020-07-22 MED ORDER — FUROSEMIDE 20 MG PO TABS: 20.0000 mg | ORAL_TABLET | ORAL | 3 refills | Status: DC

## 2020-07-22 MED ORDER — APIXABAN 5 MG PO TABS: 5.0000 mg | ORAL_TABLET | Freq: Two times a day (BID) | ORAL | 3 refills | Status: DC

## 2020-07-22 MED ORDER — IRBESARTAN 300 MG PO TABS: 300.0000 mg | ORAL_TABLET | Freq: Every day | ORAL | 3 refills | Status: DC

## 2020-07-22 NOTE — Patient Instructions (Addendum)
Medication Instructions:  1) STOP DILTIAZEM  2) DECREASE METOPROLOL to 25 mg twice daily *If you need a refill on your cardiac medications before your next appointment, please call your pharmacy*  Lab Work: TODAY! TSH, BMET If you have labs (blood work) drawn today and your tests are completely normal, you will receive your results only by: Marland Kitchen MyChart Message (if you have MyChart) OR . A paper copy in the mail If you have any lab test that is abnormal or we need to change your treatment, we will call you to review the results.  Follow-Up: You are scheduled for follow-up with Tereso Newcomer on 08/27/2020 at 11:45AM.

## 2020-07-23 ENCOUNTER — Telehealth: Payer: Self-pay

## 2020-07-23 NOTE — Telephone Encounter (Signed)
-----   Message from Vesta Mixer, MD sent at 07/22/2020  5:25 PM EST ----- Labs are stable

## 2020-07-23 NOTE — Telephone Encounter (Signed)
The patient has been notified of the result and verbalized understanding.  All questions (if any) were answered. Leanord Hawking, RN 07/23/2020 9:33 AM

## 2020-07-30 ENCOUNTER — Telehealth: Payer: Self-pay | Admitting: Cardiovascular Disease

## 2020-07-30 NOTE — Telephone Encounter (Signed)
Called patient back about her message. Patient stated this happened on Monday night and her family called EMS. EMS came out to her home and told her she was fine. Reviewed patient's last office visit note. Patient was suppose to stop diltiazem and reduce her metoprolol. Patient stated she never stopped her diltiazem. Encouraged patient to stop diltiazem and continue to take her BP and HR daily. On Monday patient's BP was 128/59 and HR 57. Today her BP 148/67 and HR 61. Encouraged patient to call her PCP to let him know what is going on as well, just in case this is not heart related. Will forward message to Dr. Acie Fredrickson for further advisement.

## 2020-07-30 NOTE — Telephone Encounter (Signed)
Called patient back with Dr. Elmarie Shiley message. Patient verbalized understanding and will she will call her PCP.

## 2020-07-30 NOTE — Telephone Encounter (Signed)
I do not think this is cardiac related.  HR is still slow so I agree that she should DC the cardizem as planned.  She needs to call her primary md to discuss

## 2020-07-30 NOTE — Telephone Encounter (Signed)
  Patient states Sunday night that she went to sleep in her recliner and she said her family tried to wake her and she couldn't wake up. She states she was aware of them trying to wake her but she couldn't get herself to fully come awake.

## 2020-08-26 NOTE — Progress Notes (Signed)
Cardiology Office Note:    Date:  08/27/2020   ID:  Stacy [name], DOB [birth year], MRN [id number]  PCP:  [name], MD  [facility] Cardiologist:  [name], MD   [facility] Electrophysiologist:  None   Referring MD: [name], MD   Chief Complaint:  Follow-up (PAF, CHF, HOCM)    Patient Profile:    Stacy [name] is a 87 y.o. female with:   Paroxysmal atrial fibrillation   admx in 2/18 w AF w RVR  Hypertrophic Obstructive Cardiomyopathy    Mitral regurgitation   Hypertension   Hyperlipidemia   Rheumatoid arthritis   (HFpEF) heart failure with preserved ejection fraction   Prior CV studies: Echo 12/27/16 Severe concentric LVH, EF 60-65, normal wall motion, grade 2 diastolic dysfunction, LVOT peak gradient 20 mmHg, septal thickness 18.3 mm, mild aortic stenosis, mild AI, MAC, mild MR, severe LAE, normal RVSF, mild TR, PASP 33  Echo 09/09/16 Moderate concentric LVH with severe septal hypertrophy-consistent with HCM, EF 60-65, dynamic obstruction with peak gradient 94, normal wall motion, septal thickness 17.34 mm, MAC with + SAM, severe MR, severe LAE, normal RVSF, mild TR, PASP 25  Echo 10/16 Severe LVH, severe focal basal septal hypertrophy with moderate posterior wall hypertrophy, EF 55-60, peak gradient 32, normal wall motion, MAC, + SAM, mild to moderate MR, severe LAE, normal RVSF, trivial TR/P, PASP 28  Nuclear stress test 8/07 EF 69, apical attenuation consistent with breast artifact, no ischemia   History of Present Illness:    Ms. Stacy [name] was last seen by Dr. Acie Fredrickson in 1/22.  Her HR was slow and her diltiazem was DC'd and her metoprolol dose was reduced.  She returns for f/u.  She is here alone.  Since last seen she has done well. She has not had chest pain, syncope, orthopnea.  She has chronic leg edema that is controlled with compression stockings.  She has chronic dyspnea on exertion that is unchanged.  She is NYHA II-IIb.         Past Medical History:  Diagnosis Date  . Bowel obstruction ([place])   . Diastolic dysfunction   . Gallbladder disease   . GERD (gastroesophageal reflux disease)   . HOCM (hypertrophic obstructive cardiomyopathy) ([place]) 09/27/2016   Echo 10/16: severe LVH, EF 55-60, peak 32, no RWMA, +SAM, mild to mod MR, severe LAE, PASP 28 // Echo 2/18: mod conc LVH, severe septal LVH c/w HCM, EF 60-65, peak LVOT 94, no RWMA, + SAM, severe MR, severe LAE, PASP 25 // Echo 6/18: severe conc LVH, EF 60-65, noRWMA, Gr 2 DD, peak LVOT gradient 20, mild aortic stenosis, mild AI, mild MR, severe LAE, mild TR, PASP 33   . Hypertension   . LVH (left ventricular hypertrophy)    WITH ASYMMETRIC SEPTAL HYPERTROPHY  . Mitral regurgitation 09/09/2016   Severe by echo 08/2016  . Persistent atrial fibrillation ([place]) 09/09/2016  . Rheumatoid arthritis (HCC)     Current Medications: Current Meds  Medication Sig  . amiodarone (PACERONE) 200 MG tablet Take 0.5 tablets (100 mg total) by mouth daily.  Marland Kitchen amoxicillin-clavulanate (AUGMENTIN) 875-125 MG tablet Take 1 tablet by mouth 2 (two) times daily.  Marland Kitchen amoxicillin-clavulanate (AUGMENTIN) 875-125 MG tablet Take 1 tablet by mouth as needed (for dental visits).  Marland Kitchen apixaban (ELIQUIS) 5 MG TABS tablet Take 1 tablet (5 mg total) by mouth 2 (two) times daily.  Marland Kitchen buPROPion (WELLBUTRIN SR) 100 MG 12 hr tablet Take 100 mg by  mouth every morning.  . folic acid (FOLVITE) 1 MG tablet Take 3 mg by mouth daily.  . furosemide (LASIX) 20 MG tablet Take 1 tablet (20 mg total) by mouth every Monday, Wednesday, and Friday.  . hydrALAZINE (APRESOLINE) 25 MG tablet Take 1 tablet 3 times daily if BP >140  . irbesartan (AVAPRO) 300 MG tablet Take 1 tablet (300 mg total) by mouth daily.  . meclizine (ANTIVERT) 25 MG tablet Take 25 mg by mouth daily as needed for dizziness.   . methotrexate (RHEUMATREX) 2.5 MG tablet Take 15 mg by mouth every Saturday.   . metoprolol tartrate (LOPRESSOR) 25 MG  tablet Take 1 tablet (25 mg total) by mouth 2 (two) times daily.  . potassium chloride (KLOR-CON) 10 MEQ tablet Take 1 tablet (10 mEq total) by mouth every Monday, Wednesday, and Friday.  Marland Kitchen PROAIR RESPICLICK 388 (90 Base) MCG/ACT AEPB Inhale 2 puffs into the lungs every 6 (six) hours as needed (wheezing, shortnes of breath).   Marland Kitchen rOPINIRole (REQUIP) 1 MG tablet Take 1 mg by mouth at bedtime.      Allergies:   Sulfa antibiotics   Social History   Tobacco Use  . Smoking status: Never Smoker  . Smokeless tobacco: Never Used  Vaping Use  . Vaping Use: Never used  Substance Use Topics  . Alcohol use: No  . Drug use: No     Family Hx: The patient's family history includes Breast cancer in her daughter; Heart attack in her brother; Hypertension in her mother; Leukemia in her father; Stroke in her mother.  Review of Systems  Gastrointestinal: Negative for hematochezia and melena.  Genitourinary: Negative for hematuria.     EKGs/Labs/Other Test Reviewed:    EKG:  EKG is   ordered today.  The ekg ordered today demonstrates normal sinus rhythm ,HR 60, normal axis, 1st degree AVB, PR 268 ms, septal Q waves, non-specific ST-TW changes, QTc 470, no change from prior tracings.   Recent Labs: 02/26/2020: Hemoglobin 12.9; Platelets 198 07/22/2020: BUN 18; Creatinine, Ser 0.91; Potassium 4.0; Sodium 138; TSH 2.540   Recent Lipid Panel Lab Results  Component Value Date/Time   CHOL 185 09/10/2016 12:12 AM   TRIG 136 09/10/2016 12:12 AM   HDL 43 09/10/2016 12:12 AM   CHOLHDL 4.3 09/10/2016 12:12 AM   LDLCALC 115 (H) 09/10/2016 12:12 AM   Labs obtained through [facility] - personally reviewed and interpreted: 04/07/20: ALT 13, Hgb 12.7  Risk Assessment/Calculations:    CHA2DS2-VASc Score = 5  This indicates a 7.2% annual risk of stroke. The patient's score is based upon: CHF History: Yes HTN History: Yes Diabetes History: No Stroke History: No Vascular Disease History: No Age Score:  2 Gender Score: 1     Physical Exam:    VS:  BP 140/60   Pulse 60   Ht _0  (1.575 m)   Wt 148 lb (67.1 kg)   LMP  (LMP Unknown)   SpO2 96%   BMI 27.07 kg/m     Wt Readings from Last 3 Encounters:  08/27/20 148 lb (67.1 kg)  07/22/20 149 lb (67.6 kg)  03/27/20 147 lb 6.4 oz (66.9 kg)     Constitutional:      Appearance: Healthy appearance. Not in distress.  Neck:     Vascular: No JVR. JVD normal.  Pulmonary:     Effort: Pulmonary effort is normal.     Breath sounds: No wheezing. No rales.  Cardiovascular:     Normal  rate. Regular rhythm. Normal S1. Normal S2.     Murmurs: There is a grade 2/6 systolic murmur at the LLSB.  Edema:    Peripheral edema absent.  Abdominal:     Palpations: Abdomen is soft. There is no hepatomegaly.  Skin:    General: Skin is warm and dry.  Neurological:     Mental Status: Alert and oriented to person, place and time.     Cranial Nerves: Cranial nerves are intact.       ASSESSMENT & PLAN:    1. Paroxysmal atrial fibrillation (HCC) Maintaining normal sinus rhythm.  Continue current dose of Amiodarone, Apixaban.  Wt is 67.1 kg and SCr < 1.5.  Recent Hgb normal.  F/u in 6 mos.   2. HOCM (hypertrophic obstructive cardiomyopathy) (HCC) Peak LVOT gradient by echocardiogram in 2018 was 20 mmHg. She is not symptomatic. Continue current dose of beta-blocker.    3. Essential hypertension Fair control.  Continue current Rx.   4. Bradycardia HR has improved with the discontinuation of Diltiazem and reduction in Metoprolol dose.  Continue current Rx.     Dispo:  Return in about 6 months (around 02/24/2021) for Routine Follow Up, w/ Dr. Acie Fredrickson, in person.   Medication Adjustments/Labs and Tests Ordered: Current medicines are reviewed at length with the patient today.  Concerns regarding medicines are outlined above.  Tests Ordered: Orders Placed This Encounter  Procedures  . EKG 12-Lead   Medication Changes: No orders of the defined  types were placed in this encounter.   Signed, Richardson Dopp, PA-C  08/27/2020 12:11 PM    [facility] [place], [place], [place]  [address] Phone: [phone]; Fax: [phone]

## 2020-08-27 ENCOUNTER — Encounter: Payer: Self-pay | Admitting: Physician Assistant

## 2020-08-27 ENCOUNTER — Ambulatory Visit: Payer: Medicare Other | Admitting: Physician Assistant

## 2020-08-27 ENCOUNTER — Other Ambulatory Visit: Payer: Self-pay

## 2020-08-27 VITALS — BP 140/60 | HR 60 | Ht 62.0 in | Wt 148.0 lb

## 2020-08-27 DIAGNOSIS — I480 Paroxysmal atrial fibrillation: Secondary | ICD-10-CM

## 2020-08-27 DIAGNOSIS — I421 Obstructive hypertrophic cardiomyopathy: Secondary | ICD-10-CM

## 2020-08-27 DIAGNOSIS — R001 Bradycardia, unspecified: Secondary | ICD-10-CM

## 2020-08-27 DIAGNOSIS — I10 Essential (primary) hypertension: Secondary | ICD-10-CM | POA: Diagnosis not present

## 2020-08-27 DIAGNOSIS — E782 Mixed hyperlipidemia: Secondary | ICD-10-CM

## 2020-08-27 NOTE — Patient Instructions (Signed)
Medication Instructions:  Your physician recommends that you continue on your current medications as directed. Please refer to the Current Medication list given to you today.  *If you need a refill on your cardiac medications before your next appointment, please call your pharmacy*   Lab Work: None ordered   If you have labs (blood work) drawn today and your tests are completely normal, you will receive your results only by: Marland Kitchen MyChart Message (if you have MyChart) OR . A paper copy in the mail If you have any lab test that is abnormal or we need to change your treatment, we will call you to review the results.   Testing/Procedures: None ordered    Follow-Up: At [facility], you and your health needs are our priority.  As part of our continuing mission to provide you with exceptional heart care, we have created designated Provider Care Teams.  These Care Teams include your primary Cardiologist (physician) and Advanced Practice Providers (APPs -  Physician Assistants and Nurse Practitioners) who all work together to provide you with the care you need, when you need it.  We recommend signing up for the patient portal called "MyChart".  Sign up information is provided on this After Visit Summary.  MyChart is used to connect with patients for Virtual Visits (Telemedicine).  Patients are able to view lab/test results, encounter notes, upcoming appointments, etc.  Non-urgent messages can be sent to your provider as well.   To learn more about what you can do with MyChart, go to [email or web address].    Your next appointment:   6 month(s)  The format for your next appointment:   In Person  Provider:   You may see [name], MD or one of the following Advanced Practice Providers on your designated Care Team:    Richardson Dopp, PA-C  [name], [place]    Other Instructions None

## 2020-09-02 ENCOUNTER — Telehealth: Payer: Self-pay | Admitting: Cardiovascular Disease

## 2020-09-02 NOTE — Telephone Encounter (Signed)
Left message to call back  

## 2020-09-02 NOTE — Telephone Encounter (Signed)
  STAT if HR is under 50 or over 120 (normal HR is 60-100 beats per minute)  1) What is your heart rate? 122 at 9pm last night  2) Do you have a log of your heart rate readings (document readings)? no  3) Do you have any other symptoms? Merissa, RN states the patient has no other symptoms. She says patient stated that yesterday she had an unusually rough day with her husband who has dementia.

## 2020-09-03 NOTE — Telephone Encounter (Signed)
Returned pt's call from the other day regarding elevated HR. Pt forgot she had called [place] and states her HR is fine and she feels fine. No questions/concerns at this time.

## 2020-12-09 ENCOUNTER — Telehealth: Payer: Self-pay | Admitting: Cardiovascular Disease

## 2020-12-09 NOTE — Telephone Encounter (Signed)
Pt c/o Shortness Of Breath: STAT if SOB developed within the last 24 hours or pt is noticeably SOB on the phone  1. Are you currently SOB (can you hear that pt is SOB on the phone)? A little bit, cannot hear over the phone  2. How long have you been experiencing SOB? Off and on for about a month   3. Are you SOB when sitting or when up moving around? More so when moving around, but sometimes both   4. Are you currently experiencing any other symptoms? No   Wants to know if there is something Dr. Acie Fredrickson can prescribe to help this. States she really doesn't want to come in to be seen if she doesn't have to. Please advise.

## 2020-12-09 NOTE — Telephone Encounter (Signed)
Attempted to call patient. Hung up after many rings as no one answered and no VM picked up to leave message.  Will try again later.

## 2020-12-11 NOTE — Telephone Encounter (Signed)
Spoke with pt and states she " feels fine" . Pt will continue to monitor and will call back if needed. 6 month follow up made with Dr Acie Fredrickson 02/20/21 at 9:20 am .Adonis Housekeeper

## 2021-02-20 ENCOUNTER — Ambulatory Visit: Payer: Medicare Other | Admitting: Cardiovascular Disease

## 2021-03-05 ENCOUNTER — Encounter: Payer: Self-pay | Admitting: Cardiovascular Disease

## 2021-03-05 NOTE — Progress Notes (Signed)
Stacy [name] Date of Birth  08/25/1928 [facility]      [address]    [address]    [place], [place]  [address]       Problems: 1. 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 Hyperlipidemia 3. Mitral regurgitation 4. Paroxysmal atrial fib (diagnosed Feb. 2018 )  CHads2VASc 5 ( female  age 87, HTN, diastolic CHF)     previous notes   Stacy [name] is done very well from a cardiac standpoint. She's not had any episodes of chest pain or shortness of breath. She's had a cough and bronchitis for the past several weeks. She denies any syncope or presyncope. She denies any chest pain.  Her blood pressure has been well-controlled on. She's not had any problems.   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  April 10 , 2014:  No chest pain.  No dyspnea.  BP has been ok.  She gets some exercise - not as much as she should - also has orthopedic issues that limit her.   Sept. 15, 2014:  She has a mild dynamic  LVOT obstruction.   We increased her metoprolol at the last visit and she is doing well.  No dizziness.  She does have some vertigo and takes meclizine.  Sept. 17, 2015: Doing well.  No CP or dyspnea.   Exercising regularly.  Sept. 23, 2016:  Doing well from a cardiac standpoint Had an episode of vertigo a week ago .  Lasted for several minutes.  Did not go to the ER.  Symptoms have resolved completely   October 14, 2015: Stacy [name] has a dynamic LVOT obstruction that we are managing with metoprolol.   Her HR has been a bit slow She is off the  HCTZ .   i've advised her to avoid being volume depleted.  Has had some dizziness and headaches recently  Taking metoprolol 50 mg in the am , 25 mg in the evening   January 03, 2017:  Shaniah is seen today for follow up visit She was found to have Atrial fib with RVR ( FEb. 21-23 hospitalization) and has been started on Eliquis  She has converted back to NSR / sinus  bradycardia  Echo shows normal LV systolic function, grade 2 diastolic dysfunction, mild AS   Has not been taking her Eliquis - having occasional bloody nose. Has some bleeding under her right great toe.  BP is typically better at home   September 26, 2017   Stacy [name] is an 87 year old female with a history of paroxysmal atrial fibrillation, hypertrophic obstructive cardiomyopathy, mitral regurgitation, hyperlipidemia and diastolic heart failure.  She was seen in October, 2018 by Richardson Dopp, PA.  Avoids salt.  Has some dyspnea.  Able to work out regularly .   Has some DOE with exercise  Has not taken her meds - BP is a bit elevated today  She was on Lasix and kdur.   These were stopped by Dr. Joylene Draft  -possibly because of her dynamic outflow tract obstruction. Has some leg swelling    January 02, 2018:  Stacy [name] is seen back today for follow-up of her chronic diastolic congestive heart failure and her LVOT obstruction.  We added Lasix 20 mg 3 times a week as well as potassium chloride 10 mEq 3 times a week. She is feeling much better at this point.   No syncope . Getting regular exercise  April 03, 2018:  [name] is seen back today for follow-up visit.  She has had some volume overload.  She has normal left ventricular systolic function.  In the past we have been concerned about dynamic LVOT obstruction but recent echocardiograms have not demonstrated a significant LVOT obstruction.  She feels fine.  She is part of a program that has home health nurses monitor   her weight.  She recently gained several pounds in 1 day and we  were alerted.  She takes Lasix 20 mg 3 days a week but notes that she really does not get much urine output today after taking the Lasix 20 mg tablets. She is not short of breath.  She has not had any leg edema.  She really does not feel any different today compared to last week with a week before.  Dec. 16, 2019: [name] is seen back today for work in visit.   She was recently seen by her primary medical doctor and was markedly hypertensive. She did not eat any extra salt. He increased the Lasix and Kdur to 5 times a week . Now her BP readings are great.  shes tolerating the increased meds.   Sept. 11, 2020  Stacy [name] is seen today for follow up of her PAF, HTN, HOCM .  Wakes up with headaches  BP looks great.  Stays active.  Works in her yard, garden, housework  No syncope,   September 27, 2019:  22 seen today for follow-up of her paroxysmal atrial fibrillation, hypertension mild hypertrophic obstructive cardiomyopathy. No CP ,  Occasional dyspnea.  No syncope  Walks , works in her garden regularly ,  Does her own housework   Sept, 9, 2021: Shailynn is seen today for follow up of her PAF, HTN, mild HOCM. She checks her BP daily , is frequently a bit elevate.  Avoids salt   Was hospitalized a month ago.   Had syncope due to a UTI . Feeling great   Jan. 4, 2022: Stacy [name] is seen today for evaluation of worsening dyspnea for the past month.  HR is slow today . She has 1st degree AV block , she is on amio 100 mg a day, cardizem 180 mg d day , Metoprolol 50 mg in AM, 25 mg in PM .  She fell 2 times over the past several months.  No syncope,  Just lost her balance.    Aug. 19, 2022: Stacy [name] is seen today for follow up of her dyspnea, afib, HOCM Is more short of breath  Is not able to do her daily activities without dysnea   Having difficulty remembering details.     Wt Readings from Last 3 Encounters:  03/06/21 140 lb 9.6 oz (63.8 kg)  08/27/20 148 lb (67.1 kg)  07/22/20 149 lb (67.6 kg)    Current Outpatient Medications on File Prior to Visit  Medication Sig Dispense Refill   amiodarone (PACERONE) 200 MG tablet Take 0.5 tablets (100 mg total) by mouth daily. 45 tablet 3   apixaban (ELIQUIS) 5 MG TABS tablet Take 1 tablet (5 mg total) by mouth 2 (two) times daily. 180 tablet 3   buPROPion (WELLBUTRIN SR) 100 MG 12 hr tablet Take  100 mg by mouth every morning.     folic acid (FOLVITE) 1 MG tablet Take 3 mg by mouth daily.     furosemide (LASIX) 20 MG tablet Take 1 tablet (20 mg total) by mouth every Monday, Wednesday, and Friday. 36 tablet 3  hydrALAZINE (APRESOLINE) 25 MG tablet Take 1 tablet 3 times daily if BP >140 270 tablet 3   irbesartan (AVAPRO) 300 MG tablet Take 1 tablet (300 mg total) by mouth daily. 90 tablet 3   meclizine (ANTIVERT) 25 MG tablet Take 25 mg by mouth daily as needed for dizziness.      methotrexate (RHEUMATREX) 2.5 MG tablet Take 15 mg by mouth every Saturday.      metoprolol tartrate (LOPRESSOR) 25 MG tablet Take 1 tablet (25 mg total) by mouth 2 (two) times daily. 180 tablet 3   potassium chloride (KLOR-CON) 10 MEQ tablet Take 1 tablet (10 mEq total) by mouth every Monday, Wednesday, and Friday. 36 tablet 3   PROAIR RESPICLICK 123XX123 (90 Base) MCG/ACT AEPB Inhale 2 puffs into the lungs every 6 (six) hours as needed (wheezing, shortnes of breath).      rOPINIRole (REQUIP) 1 MG tablet Take 1 mg by mouth at bedtime.      amoxicillin-clavulanate (AUGMENTIN) 875-125 MG tablet Take 1 tablet by mouth 2 (two) times daily. (Patient not taking: Reported on 03/06/2021) 28 tablet 0   amoxicillin-clavulanate (AUGMENTIN) 875-125 MG tablet Take 1 tablet by mouth as needed (for dental visits). (Patient not taking: Reported on 03/06/2021)     No current facility-administered medications on file prior to visit.    Allergies  Allergen Reactions   Sulfa Antibiotics Nausea Only    Past Medical History:  Diagnosis Date   Bowel obstruction (HCC)    Diastolic dysfunction    Gallbladder disease    GERD (gastroesophageal reflux disease)    HOCM (hypertrophic obstructive cardiomyopathy) ([facility]) 09/27/2016   Echo 10/16: severe LVH, EF 55-60, peak 32, no RWMA, +SAM, mild to mod MR, severe LAE, PASP 28 // Echo 2/18: mod conc LVH, severe septal LVH c/w HCM, EF 60-65, peak LVOT 94, no RWMA, + SAM, severe MR, severe LAE,  PASP 25 // Echo 6/18: severe conc LVH, EF 60-65, noRWMA, Gr 2 DD, peak LVOT gradient 20, mild aortic stenosis, mild AI, mild MR, severe LAE, mild TR, PASP 33    Hypertension    LVH (left ventricular hypertrophy)    WITH ASYMMETRIC SEPTAL HYPERTROPHY   Mitral regurgitation 09/09/2016   Severe by echo 08/2016   Persistent atrial fibrillation ([place]) 09/09/2016   Rheumatoid arthritis [facility])     Past Surgical History:  Procedure Laterality Date   CARDIOVASCULAR STRESS TEST  03/10/2006   EF 69%. NO EVIDENCE OF ISCHEMIA   CARDIOVERSION N/A 03/21/2018   Procedure: Cancelled Cardioversion;  Surgeon: Thayer Headings, MD;  Location: [facility];  Service: Cardiovascular;  Laterality: N/A;   CHOLECYSTECTOMY     TOTAL HIP ARTHROPLASTY     US ECHOCARDIOGRAPHY  02/21/2008   EF 55-60%   US ECHOCARDIOGRAPHY  07/21/2005   EF 55-60%    Social History   Tobacco Use  Smoking Status Never  Smokeless Tobacco Never    Social History   Substance and Sexual Activity  Alcohol Use No    Family History  Problem Relation Age of Onset   Leukemia Father    Heart attack Brother    Hypertension Mother    Stroke Mother    Breast cancer Daughter     Reviw of Systems:  Reviewed in the HPI.  All other systems are negative.  Physical Exam: Blood pressure (!) 142/78, pulse (!) 54, height '5\' 2"'$  (1.575 m), weight 140 lb 9.6 oz (63.8 kg), SpO2 96 %.  GEN:  Well nourished, well developed  in no acute distress HEENT: Normal NECK: No JVD; No carotid bruits LYMPHATICS: No lymphadenopathy CARDIAC: RRR , soft systolic murmur  RESPIRATORY:  Clear to auscultation without rales, wheezing or rhonchi  ABDOMEN: Soft, non-tender, non-distended MUSCULOSKELETAL:  No edema; No deformity  SKIN: Warm and dry NEUROLOGIC:  Alert and oriented x 3   ECG:    Assessment / Plan:   1. Paroxysmal atrial fib : CHADS2VASC score 5  ( female, age , HTN, CHF)   On eliquis 5 BID  Will reduce her amio dose to 400 mg / week (200 mg  on Mon, Thursday)      2.  Chronic diastolic congestive heart failure:     Stable    2. Hyperlipidemia  -  labs have been stable    3. Mitral regurgitation-       4.  Essential hypertension:    BP is largely normal   4 dynamic left ventricular obstruction stable.    Gradient is stable  Good pulses.     [name], MD  03/06/2021 4:21 PM    [facility],  [place], [place]  [address] Pager [phone] Phone: [phone]; Fax: [phone]

## 2021-03-06 ENCOUNTER — Encounter: Payer: Self-pay | Admitting: Cardiovascular Disease

## 2021-03-06 ENCOUNTER — Other Ambulatory Visit: Payer: Self-pay

## 2021-03-06 ENCOUNTER — Ambulatory Visit: Payer: Medicare Other | Admitting: Cardiovascular Disease

## 2021-03-06 VITALS — BP 142/78 | HR 54 | Ht 62.0 in | Wt 140.6 lb

## 2021-03-06 DIAGNOSIS — I5032 Chronic diastolic (congestive) heart failure: Secondary | ICD-10-CM | POA: Diagnosis not present

## 2021-03-06 DIAGNOSIS — I480 Paroxysmal atrial fibrillation: Secondary | ICD-10-CM | POA: Diagnosis not present

## 2021-03-06 DIAGNOSIS — I421 Obstructive hypertrophic cardiomyopathy: Secondary | ICD-10-CM | POA: Diagnosis not present

## 2021-03-06 MED ORDER — AMIODARONE HCL 200 MG PO TABS: ORAL_TABLET | ORAL | 3 refills | Status: AC

## 2021-03-06 NOTE — Patient Instructions (Signed)
Medication Instructions:  1) DECREASE Amiodarone to '200mg'$  once on Monday and Thursday.  *If you need a refill on your cardiac medications before your next appointment, please call your pharmacy*   Lab Work: None If you have labs (blood work) drawn today and your tests are completely normal, you will receive your results only by: [place] (if you have MyChart) OR A paper copy in the mail If you have any lab test that is abnormal or we need to change your treatment, we will call you to review the results.   Testing/Procedures: None   Follow-Up: At [facility] you and your health needs are our priority.  As part of our continuing mission to provide you with exceptional heart care, we have created designated Provider Care Teams.  These Care Teams include your primary Cardiologist (physician) and Advanced Practice Providers (APPs -  Physician Assistants and Nurse Practitioners) who all work together to provide you with the care you need, when you need it.  We recommend signing up for the patient portal called "MyChart".  Sign up information is provided on this After Visit Summary.  MyChart is used to connect with patients for Virtual Visits (Telemedicine).  Patients are able to view lab/test results, encounter notes, upcoming appointments, etc.  Non-urgent messages can be sent to your provider as well.   To learn more about what you can do with MyChart, go to [email or web address].    Your next appointment:   1 year(s)  The format for your next appointment:   In Person  Provider:   You may see [name], MD or one of the following Advanced Practice Providers on your designated Care Team:   Richardson Dopp, PA-C [name], [place]   Other Instructions

## 2021-05-19 DIAGNOSIS — I361 Nonrheumatic tricuspid (valve) insufficiency: Secondary | ICD-10-CM

## 2021-05-27 ENCOUNTER — Telehealth: Payer: Self-pay | Admitting: Cardiovascular Disease

## 2021-05-27 NOTE — Telephone Encounter (Signed)
Received a referral on behalf of pt to see Dr. Bettina Gavia in [place] due to location. Called patient to verify and pt states nothing is wrong to be seen at the moment but would like to follow up with [facility] when follow up is due 02/2022   Please okay this transfer of care  Thank you!

## 2021-08-10 ENCOUNTER — Telehealth: Payer: Self-pay | Admitting: Cardiovascular Disease

## 2021-08-10 NOTE — Telephone Encounter (Signed)
Left detailed message for EC.  Advised if HR/BP are normal dizziness would not be cardiac related.  Advised to contact PCP.  Advised to call back if further needs.

## 2021-08-10 NOTE — Telephone Encounter (Signed)
STAT if patient feels like he/she is going to faint   Are you dizzy now?  Patient's daughter is not currently with the patient  Do you feel faint or have you passed out?  No  Do you have any other symptoms?  No   Have you checked your HR and BP (record if available)?  Patient's daughter states patient's BP/HR has been normal, but she does not have readings with her at the moment because she is in the car

## 2021-08-11 ENCOUNTER — Ambulatory Visit: Payer: Medicare Other | Admitting: Cardiovascular Disease

## 2021-08-14 NOTE — Telephone Encounter (Signed)
Spoke with daughter and pt is currently in therapy for dizziness ./cy

## 2021-08-19 DIAGNOSIS — R42 Dizziness and giddiness: Secondary | ICD-10-CM | POA: Diagnosis not present

## 2021-08-19 DIAGNOSIS — M6281 Muscle weakness (generalized): Secondary | ICD-10-CM | POA: Diagnosis not present

## 2021-08-24 DIAGNOSIS — M6281 Muscle weakness (generalized): Secondary | ICD-10-CM | POA: Diagnosis not present

## 2021-08-24 DIAGNOSIS — R42 Dizziness and giddiness: Secondary | ICD-10-CM | POA: Diagnosis not present

## 2021-08-26 DIAGNOSIS — R42 Dizziness and giddiness: Secondary | ICD-10-CM | POA: Diagnosis not present

## 2021-08-26 DIAGNOSIS — M6281 Muscle weakness (generalized): Secondary | ICD-10-CM | POA: Diagnosis not present

## 2021-09-02 DIAGNOSIS — R42 Dizziness and giddiness: Secondary | ICD-10-CM | POA: Diagnosis not present

## 2021-09-02 DIAGNOSIS — M6281 Muscle weakness (generalized): Secondary | ICD-10-CM | POA: Diagnosis not present

## 2021-09-07 DIAGNOSIS — M6281 Muscle weakness (generalized): Secondary | ICD-10-CM | POA: Diagnosis not present

## 2021-09-07 DIAGNOSIS — R42 Dizziness and giddiness: Secondary | ICD-10-CM | POA: Diagnosis not present

## 2021-09-08 ENCOUNTER — Encounter (HOSPITAL_COMMUNITY): Payer: Self-pay

## 2021-09-08 ENCOUNTER — Emergency Department (HOSPITAL_COMMUNITY): Payer: Medicare Other

## 2021-09-08 ENCOUNTER — Emergency Department (HOSPITAL_COMMUNITY)
Admission: EM | Admit: 2021-09-08 | Discharge: 2021-09-08 | Disposition: A | Discharge status: Home / Self Care | Payer: Medicare Other | Attending: Emergency Medicine | Admitting: Emergency Medicine

## 2021-09-08 ENCOUNTER — Other Ambulatory Visit: Payer: Self-pay

## 2021-09-08 DIAGNOSIS — R079 Chest pain, unspecified: Secondary | ICD-10-CM | POA: Diagnosis not present

## 2021-09-08 DIAGNOSIS — Z79899 Other long term (current) drug therapy: Secondary | ICD-10-CM | POA: Diagnosis not present

## 2021-09-08 DIAGNOSIS — J929 Pleural plaque without asbestos: Secondary | ICD-10-CM | POA: Diagnosis not present

## 2021-09-08 DIAGNOSIS — R0789 Other chest pain: Secondary | ICD-10-CM | POA: Insufficient documentation

## 2021-09-08 DIAGNOSIS — I517 Cardiomegaly: Secondary | ICD-10-CM | POA: Diagnosis not present

## 2021-09-08 DIAGNOSIS — J9819 Other pulmonary collapse: Secondary | ICD-10-CM | POA: Diagnosis not present

## 2021-09-08 DIAGNOSIS — I2510 Atherosclerotic heart disease of native coronary artery without angina pectoris: Secondary | ICD-10-CM | POA: Diagnosis not present

## 2021-09-08 DIAGNOSIS — S42102A Fracture of unspecified part of scapula, left shoulder, initial encounter for closed fracture: Secondary | ICD-10-CM | POA: Diagnosis not present

## 2021-09-08 DIAGNOSIS — U071 COVID-19: Secondary | ICD-10-CM | POA: Diagnosis not present

## 2021-09-08 LAB — URINALYSIS, ROUTINE W REFLEX MICROSCOPIC
Bilirubin Urine: NEGATIVE
Glucose, UA: NEGATIVE mg/dL
Hgb urine dipstick: NEGATIVE
Ketones, ur: NEGATIVE mg/dL
Leukocytes,Ua: NEGATIVE
Nitrite: NEGATIVE
Protein, ur: NEGATIVE mg/dL
Specific Gravity, Urine: 1.015 (ref 1.005–1.030)
pH: 6 (ref 5.0–8.0)

## 2021-09-08 LAB — CBC
HCT: 42 % (ref 36.0–46.0)
Hemoglobin: 13.5 g/dL (ref 12.0–15.0)
MCH: 30.8 pg (ref 26.0–34.0)
MCHC: 32.1 g/dL (ref 30.0–36.0)
MCV: 95.7 fL (ref 80.0–100.0)
Platelets: 136 10*3/uL — ABNORMAL LOW (ref 150–400)
RBC: 4.39 MIL/uL (ref 3.87–5.11)
RDW: 14.4 % (ref 11.5–15.5)
WBC: 4.8 10*3/uL (ref 4.0–10.5)
nRBC: 0 % (ref 0.0–0.2)

## 2021-09-08 LAB — BASIC METABOLIC PANEL
Anion gap: 9 (ref 5–15)
BUN: 11 mg/dL (ref 8–23)
CO2: 24 mmol/L (ref 22–32)
Calcium: 8.8 mg/dL — ABNORMAL LOW (ref 8.9–10.3)
Chloride: 101 mmol/L (ref 98–111)
Creatinine, Ser: 0.71 mg/dL (ref 0.44–1.00)
GFR, Estimated: >60 mL/min (ref >60)
Glucose, Bld: 106 mg/dL — ABNORMAL HIGH (ref 70–99)
Potassium: 4 mmol/L (ref 3.5–5.1)
Sodium: 134 mmol/L — ABNORMAL LOW (ref 135–145)

## 2021-09-08 LAB — RESP PANEL BY RT-PCR (FLU A&B, COVID) ARPGX2
Influenza A by PCR: NEGATIVE
Influenza B by PCR: NEGATIVE
SARS Coronavirus 2 by RT PCR: POSITIVE — AB

## 2021-09-08 LAB — TROPONIN I (HIGH SENSITIVITY)
Troponin I (High Sensitivity): 10 ng/L (ref <18)
Troponin I (High Sensitivity): 11 ng/L (ref <18)

## 2021-09-08 LAB — D-DIMER, QUANTITATIVE: D-Dimer, Quant: 0.48 ug/mL-FEU (ref 0.00–0.50)

## 2021-09-08 MED ORDER — MOLNUPIRAVIR EUA 200MG CAPSULE
4.0000 | ORAL_CAPSULE | Freq: Two times a day (BID) | ORAL | 0 refills | Status: AC
Start: 2021-09-08 — End: 2021-09-13

## 2021-09-08 MED ORDER — ACETAMINOPHEN 500 MG PO TABS
1000.0000 mg | ORAL_TABLET | Freq: Once | ORAL | Status: AC
  Administered 2021-09-08: 1000 mg via ORAL
  Filled 2021-09-08: qty 2

## 2021-09-08 MED ORDER — IOHEXOL 350 MG/ML SOLN
70.0000 mL | Freq: Once | INTRAVENOUS | Status: AC | PRN
  Administered 2021-09-08: 70 mL via INTRAVENOUS

## 2021-09-08 NOTE — ED Triage Notes (Signed)
Generalized chest pains and tightness beginning yesterday while resting with a "choking" sensation. Also admits ha, dizziness, and HTN with SBP 157 at home pta.

## 2021-09-08 NOTE — ED Provider Notes (Signed)
I assumed care from Dr. Waverly Ferrari at 7:30 AM.  Patient with nonspecific chest discomfort with negative chest x-ray and CTA however upon discharge had a temperature of 101.5.  Patient's COVID test has resulted positive.  After speaking with pharmacy pt is a candidate for molnupiravir and that was sent to her pharmacy.  Pt's sats 93-96% on RA and CTA without signs of infiltrate.  Pt denying any urinary symptoms at this time.  Feel she is stable for d/c.   Blanchie Dessert, MD 09/08/21 1002

## 2021-09-08 NOTE — ED Provider Notes (Signed)
[facility] EMERGENCY DEPARTMENT Provider Note   CSN: [id number] Arrival date & time: 09/08/21  0203     History  Chief Complaint  Patient presents with   Chest Pain    Stacy [name] is a 88 y.o. female.  Patient presents to the emergency department for evaluation of chest pain.  Patient reports that she fell asleep in her recliner yesterday after church.  She was awakened from sleep by discomfort all across her chest.  She reports that it goes up into her throat and feels like she is choking.  She has had some intermittent shortness of breath.  Patient complaining of a headache that did not go away when she took Tylenol.  Blood pressure was elevated at home prior to coming to the ER.      Home Medications Prior to Admission medications   Medication Sig Start Date End Date Taking? Authorizing Provider  acetaminophen (TYLENOL) 325 MG tablet Take 650 mg by mouth every 6 (six) hours as needed for moderate pain or headache.   Yes [provider]  amiodarone (PACERONE) 200 MG tablet Take one tablet by mouth on Monday and Thursday Patient taking differently: Take 200 mg by mouth See admin instructions. Monday and Thursday 03/06/21  Yes Nahser, Wonda Cheng, MD  apixaban (ELIQUIS) 5 MG TABS tablet Take 1 tablet (5 mg total) by mouth 2 (two) times daily. 07/22/20 09/08/21 Yes Nahser, Wonda Cheng, MD  Ascorbic Acid (VITAMIN C) 1000 MG tablet Take 2,000 mg by mouth daily.   Yes [provider]  BIOTIN PO Take 1 capsule by mouth daily.   Yes [provider]  buPROPion (WELLBUTRIN SR) 100 MG 12 hr tablet Take 100 mg by mouth every morning. 03/21/20  Yes [provider]  Cholecalciferol (VITAMIN D-3 PO) Take 1 tablet by mouth daily.   Yes [provider]  furosemide (LASIX) 20 MG tablet Take 1 tablet (20 mg total) by mouth every Monday, Wednesday, and Friday. 07/23/20 09/08/21 Yes Nahser, Wonda Cheng, MD  hydrALAZINE (APRESOLINE) 25 MG tablet Take 1  tablet 3 times daily if BP >140 Patient taking differently: Take 25 mg by mouth as needed (if blood pressure blood is over 140). 07/22/20  Yes Nahser, Wonda Cheng, MD  irbesartan (AVAPRO) 300 MG tablet Take 1 tablet (300 mg total) by mouth daily. 07/22/20 09/08/21 Yes Nahser, Wonda Cheng, MD  meclizine (ANTIVERT) 25 MG tablet Take 25 mg by mouth daily as needed for dizziness.  02/07/15  Yes [provider]  metoprolol tartrate (LOPRESSOR) 25 MG tablet Take 1 tablet (25 mg total) by mouth 2 (two) times daily. 07/22/20 09/08/21 Yes Nahser, Wonda Cheng, MD  Polyvinyl Alcohol-Povidone (REFRESH OP) Place 1 drop into both eyes as needed (dry eyes).   Yes [provider]  potassium chloride (KLOR-CON) 10 MEQ tablet Take 1 tablet (10 mEq total) by mouth every Monday, Wednesday, and Friday. 07/23/20 09/08/21 Yes Nahser, Wonda Cheng, MD  PROAIR RESPICLICK [phone] Base) MCG/ACT AEPB Inhale 2 puffs into the lungs every 6 (six) hours as needed (wheezing, shortnes of breath).  04/22/17  Yes [provider]  rOPINIRole (REQUIP) 1 MG tablet Take 1 mg by mouth at bedtime.  03/27/15  Yes [provider]  zinc gluconate 50 MG tablet Take 50 mg by mouth daily.   Yes [provider]      Allergies    Sulfa antibiotics    Review of Systems   Review of Systems  Cardiovascular:  Positive for chest pain.  Neurological:  Positive for dizziness and headaches.   Physical Exam Updated Vital Signs BP (!) 158/97    Pulse 85    Temp 98 F (36.7 C)    Resp 18    Ht 5\' 2"  (1.575 m)    Wt 68 kg    LMP  (LMP Unknown)    SpO2 97%    BMI 27.44 kg/m  Physical Exam Vitals and nursing note reviewed.  Constitutional:      General: She is not in acute distress.    Appearance: She is well-developed.  HENT:     Head: Normocephalic and atraumatic.     Mouth/Throat:     Mouth: Mucous membranes are moist.  Eyes:     General: Vision grossly intact. Gaze aligned appropriately.     Extraocular Movements:  Extraocular movements intact.     Conjunctiva/sclera: Conjunctivae normal.  Cardiovascular:     Rate and Rhythm: Normal rate. Rhythm irregular.     Pulses: Normal pulses.     Heart sounds: Normal heart sounds, S1 normal and S2 normal. No murmur heard.   No friction rub. No gallop.  Pulmonary:     Effort: Pulmonary effort is normal. No respiratory distress.     Breath sounds: Normal breath sounds.  Abdominal:     General: Bowel sounds are normal.     Palpations: Abdomen is soft.     Tenderness: There is no abdominal tenderness. There is no guarding or rebound.     Hernia: No hernia is present.  Musculoskeletal:        General: No swelling.     Cervical back: Full passive range of motion without pain, normal range of motion and neck supple. No spinous process tenderness or muscular tenderness. Normal range of motion.     Right lower leg: No edema.     Left lower leg: No edema.  Skin:    General: Skin is warm and dry.     Capillary Refill: Capillary refill takes less than 2 seconds.     Findings: No ecchymosis, erythema, rash or wound.  Neurological:     General: No focal deficit present.     Mental Status: She is alert and oriented to person, place, and time.     GCS: GCS eye subscore is 4. GCS verbal subscore is 5. GCS motor subscore is 6.     Cranial Nerves: Cranial nerves 2-12 are intact.     Sensory: Sensation is intact.     Motor: Motor function is intact.     Coordination: Coordination is intact.  Psychiatric:        Attention and Perception: Attention normal.        Mood and Affect: Mood normal.        Speech: Speech normal.        Behavior: Behavior normal.    ED Results / Procedures / Treatments   Labs (all labs ordered are listed, but only abnormal results are displayed) Labs Reviewed  BASIC METABOLIC PANEL - Abnormal; Notable for the following components:      Result Value   Sodium 134 (*)    Glucose, Bld 106 (*)    Calcium 8.8 (*)    All other components  within normal limits  CBC - Abnormal; Notable for the following components:   Platelets 136 (*)    All other components within normal limits  D-DIMER, QUANTITATIVE  TROPONIN I (HIGH SENSITIVITY)  TROPONIN I (HIGH SENSITIVITY)  EKG None  Radiology DG Chest 2 View  Result Date: 09/08/2021 CLINICAL DATA:  Chest pain and tightness EXAM: CHEST - 2 VIEW COMPARISON:  11/06/2017 FINDINGS: Cardiac shadow is enlarged but stable. Aortic calcifications are seen. The lungs are well aerated bilaterally. No focal infiltrate or effusion is seen. No acute bony abnormality is noted. IMPRESSION: Stable cardiomegaly.  No acute abnormality noted. Electronically Signed   By: Inez Catalina M.D.   On: 09/08/2021 02:42   CT ANGIO CHEST AORTA W/CM & OR WO/CM  Result Date: 09/08/2021 CLINICAL DATA:  Chest or back pain with aortic dissection suspected EXAM: CT ANGIOGRAPHY CHEST WITH CONTRAST TECHNIQUE: Multidetector CT imaging of the chest was performed using the standard protocol during bolus administration of intravenous contrast. Multiplanar CT image reconstructions and MIPs were obtained to evaluate the vascular anatomy. RADIATION DOSE REDUCTION: This exam was performed according to the departmental dose-optimization program which includes automated exposure control, adjustment of the mA and/or kV according to patient size and/or use of iterative reconstruction technique. CONTRAST:  35mL OMNIPAQUE IOHEXOL 350 MG/ML SOLN COMPARISON:  05/18/2021 FINDINGS: Cardiovascular: Preferential opacification of the thoracic aorta. No evidence of thoracic aortic aneurysm or dissection. Enlarged heart size. Mitral annular calcification. LAD atheromatous calcification. Atheromatous calcification of the aorta. Mediastinum/Nodes: Negative for adenopathy or mass. Benign heterogeneity of the thyroid. Lungs/Pleura: Lungs are clear. No pleural effusion or pneumothorax. Generalized airway thickening with intermittent collapse. Mild subpleural  scarring. Upper Abdomen: Fullness of the bilateral intrarenal collecting system similar to comparison. 1 cm simple cystic density in the pancreatic head considered incidental for patient age. Musculoskeletal: Generalized thoracic spine degeneration. Remote fracture of the inferior left scapula. Review of the MIP images confirms the above findings. IMPRESSION: 1. No acute finding including thoracic acute aortic syndrome. 2. Cardiomegaly and atherosclerosis. Electronically Signed   By: Jorje Guild M.D.   On: 09/08/2021 07:28    Procedures Procedures    Medications Ordered in ED Medications  iohexol (OMNIPAQUE) 350 MG/ML injection 70 mL (70 mLs Intravenous Contrast Given 09/08/21 0708)    ED Course/ Medical Decision Making/ A&P                           Medical Decision Making Amount and/or Complexity of Data Reviewed Labs: ordered. Radiology: ordered.  Risk Prescription drug management.   Presents to the emergency department for evaluation of chest pain.  Patient reports pain and tightness across her chest that began yesterday.  She felt like there was a "choking sensation" patient reports that her blood pressure was elevated and she did have a headache.  Patient in atrial fibrillation on cardiac monitor.  EKG without ischemic changes, no infarct.  Blood pressure mildly elevated here.  Headache has resolved continuously without intervention.  She has a normal neurologic exam.  Doubt intracranial abnormality.  Troponin negative x2.  D-dimer negative, unlikely to be PE.  She did undergo CT angiography of chest to rule out aortic syndrome and it was negative.  Patient no longer symptomatic, feels back to her baseline.  Will be safe for discharge.        Final Clinical Impression(s) / ED Diagnoses Final diagnoses:  Chest pain, unspecified type    Rx / DC Orders ED Discharge Orders     None         Orpah Greek, MD 09/08/21 [phone]

## 2021-09-08 NOTE — Discharge Instructions (Addendum)
You need to quarentine for 5 days.  If you start getting shortness of breath, vomiting or confusion return to the ER.

## 2021-09-10 ENCOUNTER — Other Ambulatory Visit: Payer: Self-pay

## 2021-09-10 MED ORDER — METOPROLOL TARTRATE 25 MG PO TABS: 25.0000 mg | ORAL_TABLET | Freq: Two times a day (BID) | ORAL | 2 refills | Status: DC

## 2021-09-10 NOTE — Telephone Encounter (Signed)
Pt's medication was sent to pt's pharmacy as requested. Confirmation received.  °

## 2021-09-14 ENCOUNTER — Ambulatory Visit: Payer: Medicare Other | Admitting: Cardiovascular Disease

## 2021-10-07 ENCOUNTER — Telehealth: Payer: Self-pay | Admitting: Cardiovascular Disease

## 2021-10-07 NOTE — Telephone Encounter (Signed)
Called [facility] to inform them that pt no longer takes diltiazem 180 mg. This medication was D/C by provider on 07/22/2020. Pharmacy verbalized understanding.  ?

## 2021-10-07 NOTE — Telephone Encounter (Signed)
New Message: ? ? ? ?Baxter Flattery from [facility] called, She wants to know if the patient is still taking Diltiazem 180 mg? If so, they will need a new prescription for this please. ?

## 2021-11-03 DIAGNOSIS — D485 Neoplasm of uncertain behavior of skin: Secondary | ICD-10-CM | POA: Diagnosis not present

## 2021-11-03 DIAGNOSIS — H26491 Other secondary cataract, right eye: Secondary | ICD-10-CM | POA: Diagnosis not present

## 2021-11-03 DIAGNOSIS — H52203 Unspecified astigmatism, bilateral: Secondary | ICD-10-CM | POA: Diagnosis not present

## 2021-11-03 DIAGNOSIS — H353121 Nonexudative age-related macular degeneration, left eye, early dry stage: Secondary | ICD-10-CM | POA: Diagnosis not present

## 2021-11-04 ENCOUNTER — Telehealth: Payer: Self-pay | Admitting: Cardiovascular Disease

## 2021-11-04 NOTE — Telephone Encounter (Signed)
Pt called to report that she has been having increased with positional changes dizziness the past several days... she has been having increased SOB with minimal exertion...  she denies chest pain, no palpitations... she says she used to get therapy for her dizziness but she stopped it some time ago due to having COVID then the FLU.... I advised her to talk with her PCP about starting the therapy again... she will try to be sure that she is hydrating and eating well... she will try to monitor her BP and HR... I made her an appt with Dr. Acie Fredrickson this Friday.. she will bring a list of her BP readings.  ? ?She will be careful with position changes until she is seen... and will call back if anything changes prior to her appt.  ?

## 2021-11-04 NOTE — Telephone Encounter (Signed)
Pt c/o Shortness Of Breath: STAT if SOB developed within the last 24 hours or pt is noticeably SOB on the phone ? ?1. Are you currently SOB (can you hear that pt is SOB on the phone)? No ? ?2. How long have you been experiencing SOB?  ?2 weeks ? ?3. Are you SOB when sitting or when up moving around? Both ? ?4. Are you currently experiencing any other symptoms? Dizziness ? ?

## 2021-11-06 ENCOUNTER — Encounter: Payer: Self-pay | Admitting: Cardiovascular Disease

## 2021-11-06 ENCOUNTER — Ambulatory Visit: Payer: Medicare Other | Admitting: Cardiovascular Disease

## 2021-11-06 VITALS — BP 140/72 | HR 55 | Ht 60.0 in | Wt 136.4 lb

## 2021-11-06 DIAGNOSIS — R42 Dizziness and giddiness: Secondary | ICD-10-CM

## 2021-11-06 DIAGNOSIS — I421 Obstructive hypertrophic cardiomyopathy: Secondary | ICD-10-CM | POA: Diagnosis not present

## 2021-11-06 DIAGNOSIS — I10 Essential (primary) hypertension: Secondary | ICD-10-CM | POA: Diagnosis not present

## 2021-11-06 NOTE — Progress Notes (Signed)
? ? ?Stacy [name] ?Date of Birth  [birth year] ?[facility]      ?[address]    ?[place], [place]  [id number]     ? ? ?Problems: ?1. 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 Hyperlipidemia ?3. Mitral regurgitation ?4. Paroxysmal atrial fib (diagnosed Feb. 2018 )  CHads2VASc 5 ( female  age 88, HTN, diastolic CHF)  ? ? ? previous notes ? ? ?Stacy [name] is done very well from a cardiac standpoint. She's not had any episodes of chest pain or shortness of breath. She's had a cough and bronchitis for the past several weeks. She denies any syncope or presyncope. She denies any chest pain. ? ?Her blood pressure has been well-controlled on. She's not had any problems.   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 ? ?April 10 , 2014: ? ?No chest pain.  No dyspnea.  BP has been ok.  She gets some exercise - not as much as she should - also has orthopedic issues that limit her.  ? ?Sept. 15, 2014: ? ?She has a mild dynamic  LVOT obstruction.   We increased her metoprolol at the last visit and she is doing well.  No dizziness.  She does have some vertigo and takes meclizine. ? ?Sept. 17, 2015: ?Doing well.  No CP or dyspnea.   ?Exercising regularly. ? ?Sept. 23, 2016: ? ?Doing well from a cardiac standpoint ?Had an episode of vertigo a week ago .  ?Lasted for several minutes.  Did not go to the ER.  ?Symptoms have resolved completely  ? ?October 14, 2015: ?Stacy [name] has a dynamic LVOT obstruction that we are managing with metoprolol.   Her HR has been a bit slow ?She is off the  HCTZ .   ?i've advised her to avoid being volume depleted.  ?Has had some dizziness and headaches recently  ?Taking metoprolol 50 mg in the am , 25 mg in the evening  ? ?January 03, 2017: ? ?Stacy [name] is seen today for follow up visit ?She was found to have Atrial fib with RVR ( FEb. 21-23 hospitalization) and has been started on Eliquis  ?She has converted back to NSR / sinus  bradycardia  ?Echo shows normal LV systolic function, grade 2 diastolic dysfunction, mild AS  ? ?Has not been taking her Eliquis - having occasional bloody nose. Has some bleeding under her right great toe. ? ?BP is typically better at home  ? ?September 26, 2017  ? ?Stacy [name] is an 88 year old female with a history of paroxysmal atrial fibrillation, hypertrophic obstructive cardiomyopathy, mitral regurgitation, hyperlipidemia and diastolic heart failure.  She was seen in October, 2018 by Richardson Dopp, PA. ? ?Avoids salt.  Has some dyspnea.  Able to work out regularly .   Has some DOE with exercise  ?Has not taken her meds - BP is a bit elevated today  ?She was on Lasix and kdur.   These were stopped by Dr. Joylene Draft  -possibly because of her dynamic outflow tract obstruction. ?Has some leg swelling   ? ?January 02, 2018: ? ?Stacy [name] is seen back today for follow-up of her chronic diastolic congestive heart failure and her LVOT obstruction.  We added Lasix 20 mg 3 times a week as well as potassium chloride 10 mEq 3 times a week. ?She is feeling much better at this point.   No syncope . ?Getting regular exercise  ? ?  April 03, 2018: ? Stacy [name] is seen back today for follow-up visit.  She has had some volume overload.  She has normal left ventricular systolic function.  In the past we have been concerned about dynamic LVOT obstruction but recent echocardiograms have not demonstrated a significant LVOT obstruction. ? ?She feels fine.  She is part of a program that has home health nurses monitor   her weight.  She recently gained several pounds in 1 day and we  were alerted.  She takes Lasix 20 mg 3 days a week but notes that she really does not get much urine output today after taking the Lasix 20 mg tablets. ?She is not short of breath.  She has not had any leg edema.  She really does not feel any different today compared to last week with a week before. ? ?Dec. 16, 2019: ?Stacy [name] is seen back today for work in visit.   She was recently seen by her primary medical doctor and was markedly hypertensive. ?She did not eat any extra salt. ?He increased the Lasix and Kdur to 5 times a week . ?Now her BP readings are great.  ?shes tolerating the increased meds.  ? ?Sept. 11, 2020 ? ?Stacy [name] is seen today for follow up of her PAF, HTN, HOCM .  ?Wakes up with headaches  ?BP looks great.  ?Stays active.  Works in her yard, garden, housework  ?No syncope,  ? ?September 27, 2019: ? ?20 seen today for follow-up of her paroxysmal atrial fibrillation, hypertension mild hypertrophic obstructive cardiomyopathy. ?No CP ,  Occasional dyspnea.  ?No syncope  ?Walks , works in her garden regularly ,  Does her own housework  ? ?Sept, 9, 2021: ?Stacy [name] is seen today for follow up of her PAF, HTN, mild HOCM. ?She checks her BP daily , is frequently a bit elevate.  ?Avoids salt  ? ?Was hospitalized a month ago.   Had syncope due to a UTI . ?Feeling great  ? ?Jan. 4, 2022: ?Stacy [name] is seen today for evaluation of worsening dyspnea for the past month.  ?HR is slow today . She has 1st degree AV block , she is on amio 100 mg a day, cardizem 180 mg d day , ?Metoprolol 50 mg in AM, 25 mg in PM .  ?She fell 2 times over the past several months.  No syncope,  Just lost her balance.  ? ? ?Aug. 19, 2022: ?Stacy [name] is seen today for follow up of her dyspnea, afib, HOCM ?Is more short of breath  ?Is not able to do her daily activities without dysnea   ?Having difficulty remembering details.  ? ? ?November 06, 2021: ? ?Stacy [name] is seen today for follow-up of her atrial fibrillation, HOCM.  She had more shortness of breath at ? ? ? ?Is dizzy when she changes position -  ?Occurs when she lies down in bed . ?Also has orthostasis when getting up out of bed ?Has hydralazine listed as an as needed medication but she has not taken that in quite some time.  She takes Lasix 20 mg a day on Mondays, Wednesdays, Fridays. ?Her symptoms are not necessarily worse on specific days but she has  these episodes every day. ? ?Has talked to her primary md who did not think it was vertigo  ?  ?Her dyspnea has worsened ,   ?Short of breath while sitting in chair ?No orthopnea  ?Echocardiogram from [facility] from November, 2022 shows hyperdynamic LV function.  She has systolic anterior motion of the mitral leaflet.  She has a dynamic outflow gradient of 37 mmHg. ? ?Is worried about her dyspnea. ?Does not want to see pulmonary medicine ? ? ?We discussed having her see Dr. Dennison Bulla ?Her daughter, Lavella Hammock has HOCM and is seening Dr. Suzanne Boron next month  ? ?Wt Readings from Last 3 Encounters:  ?11/06/21 136 lb 6.4 oz (61.9 kg)  ?09/08/21 150 lb (68 kg)  ?03/06/21 140 lb 9.6 oz (63.8 kg)  ? ? ?Current Outpatient Medications on File Prior to Visit  ?Medication Sig Dispense Refill  ? acetaminophen (TYLENOL) 325 MG tablet Take 650 mg by mouth every 6 (six) hours as needed for moderate pain or headache.    ? amiodarone (PACERONE) 200 MG tablet Take one tablet by mouth on Monday and Thursday 30 tablet 3  ? apixaban (ELIQUIS) 5 MG TABS tablet Take 1 tablet (5 mg total) by mouth 2 (two) times daily. 180 tablet 3  ? Ascorbic Acid (VITAMIN C) 1000 MG tablet Take 2,000 mg by mouth daily.    ? BIOTIN PO Take 1 capsule by mouth daily.    ? Cholecalciferol (VITAMIN D-3 PO) Take 1 tablet by mouth daily.    ? furosemide (LASIX) 20 MG tablet Take 1 tablet (20 mg total) by mouth every Monday, Wednesday, and Friday. 36 tablet 3  ? hydrALAZINE (APRESOLINE) 25 MG tablet Take 1 tablet 3 times daily if BP >140 270 tablet 3  ? irbesartan (AVAPRO) 300 MG tablet Take 1 tablet (300 mg total) by mouth daily. 90 tablet 3  ? meclizine (ANTIVERT) 25 MG tablet Take 25 mg by mouth daily as needed for dizziness.     ? methotrexate 2.5 MG tablet Take 15 mg by mouth once a week.    ? metoprolol tartrate (LOPRESSOR) 25 MG tablet Take 1 tablet (25 mg total) by mouth 2 (two) times daily. 180 tablet 2  ? Polyvinyl Alcohol-Povidone (REFRESH OP)  Place 1 drop into both eyes as needed (dry eyes).    ? potassium chloride (KLOR-CON) 10 MEQ tablet Take 1 tablet (10 mEq total) by mouth every Monday, Wednesday, and Friday. 36 tablet 3  ? rOPINIRole (REQUIP

## 2021-11-06 NOTE — Patient Instructions (Signed)
Medication Instructions:  ?Your physician recommends that you continue on your current medications as directed. Please refer to the Current Medication list given to you today. ? ?*If you need a refill on your cardiac medications before your next appointment, please call your pharmacy* ? ?Lab Work: ?NONE ? ?Testing/Procedures: ?Your physician has requested that you have an echocardiogram. Echocardiography is a painless test that uses sound waves to create images of your heart. It provides your doctor with information about the size and shape of your heart and how well your heart?s chambers and valves are working. This procedure takes approximately one hour. There are no restrictions for this procedure. ? ?Follow-Up: ?At [facility], you and your health needs are our priority.  As part of our continuing mission to provide you with exceptional heart care, we have created designated Provider Care Teams.  These Care Teams include your primary Cardiologist (physician) and Advanced Practice Providers (APPs -  Physician Assistants and Nurse Practitioners) who all work together to provide you with the care you need, when you need it. ? ?We recommend signing up for the patient portal called "MyChart".  Sign up information is provided on this After Visit Summary.  MyChart is used to connect with patients for Virtual Visits (Telemedicine).  Patients are able to view lab/test results, encounter notes, upcoming appointments, etc.  Non-urgent messages can be sent to your provider as well.   ?To learn more about what you can do with MyChart, go to [email or web address].   ? ?Your next appointment:   ?6 month(s) ? ?The format for your next appointment:   ?In Person ? ?Provider:   ?[name], PA-C, Christen Bame, NP, or Richardson Dopp, PA-C     ? ? ?Important Information About Sugar ? ? ? ? ?  ?

## 2021-11-12 DIAGNOSIS — R42 Dizziness and giddiness: Secondary | ICD-10-CM | POA: Diagnosis not present

## 2021-11-18 ENCOUNTER — Telehealth: Payer: Self-pay | Admitting: Cardiovascular Disease

## 2021-11-18 DIAGNOSIS — R42 Dizziness and giddiness: Secondary | ICD-10-CM | POA: Diagnosis not present

## 2021-11-18 MED ORDER — IRBESARTAN 300 MG PO TABS: 300.0000 mg | ORAL_TABLET | Freq: Every day | ORAL | 3 refills | Status: DC

## 2021-11-18 NOTE — Addendum Note (Signed)
Addended by: Ma Hillock on: 11/18/2021 05:07 PM ? ? Modules accepted: Orders ? ?

## 2021-11-18 NOTE — Telephone Encounter (Signed)
Pt c/o BP issue: STAT if pt c/o blurred vision, one-sided weakness or slurred speech ? ?1. What are your last 5 BP readings?  ?4/30: 208/131 77 ?         194/85 92 ? ?5/01: 153/102 88 ? ?5/02: 151/105 63 ?         185/112 72 ? ?143/90 63 ? ?2. Are you having any other symptoms (ex. Dizziness, headache, blurred vision, passed out)?  ?Dizziness  ? ?3. What is your BP issue?  ? ?Patient's daughter states patient's BP has been elevated, causing her to become dizzy. She also states Dr. Delena Bali is requesting a copy of patient's echo when it is completed. ? ?

## 2021-11-18 NOTE — Telephone Encounter (Signed)
Called and spoke to daughter Caren Griffins who reports that during her last ov here (4/23) pt had reported that she was having dizziness intermittently and Dr Acie Fredrickson had advised her to check her BP when this occurred as it didn't sound cardiac related. Caren Griffins (daughter) states that she has done this for the last 3 days and provides: ?4/30: (AM) 208/131 (PM)194/85 ?5/1: (AM) 153/102 *no PM reading ?5/2: (AM) 151/105, repeated one hour later=185/112. Caren Griffins advised pt to use her Hydralazine that she has scheduled PRN for systolic BP >102. Pt did use medication and repeat last night was 143/90. Daughter calling to make [place] aware, but has instructed her mom that she must use this medication when it's needed. Daughter states that she will help keep better eye on medications and help mom to make sure she is using when needed. Has medication on hand and the dose did work to bring pressure down to normotensive readings. No measures need to be taken at this time aside from ensuring pt uses the medication when needed-she states she will. ?

## 2021-11-18 NOTE — Telephone Encounter (Signed)
Returned call to Caren Griffins who states that when she was rectifying her mom's meds, she realized that she did not have any Irbesartan (Avapro). This drug was last sent in 1/22 for 1 year and apparently when prescription expired, pt quit taking it without realizing. Per OV note by Nahser on 4/23, pt is still taking. Will renew RX now for pt. ?

## 2021-11-18 NOTE — Telephone Encounter (Signed)
Daughter was calling back with questions. Please advise  ?

## 2021-11-19 ENCOUNTER — Ambulatory Visit (HOSPITAL_COMMUNITY): Payer: Medicare Other | Attending: Cardiology

## 2021-11-19 DIAGNOSIS — R42 Dizziness and giddiness: Secondary | ICD-10-CM | POA: Diagnosis not present

## 2021-11-19 DIAGNOSIS — I421 Obstructive hypertrophic cardiomyopathy: Secondary | ICD-10-CM

## 2021-11-19 LAB — ECHOCARDIOGRAM COMPLETE
Area-P 1/2: 5.31 cm2
MV M vel: 5.8 m/s
MV Peak grad: 134.6 mmHg
MV VTI: 2.58 cm2
P 1/2 time: 486 msec
Radius: 0.8 cm
S' Lateral: 2.5 cm

## 2021-11-20 ENCOUNTER — Telehealth: Payer: Self-pay

## 2021-11-20 DIAGNOSIS — I421 Obstructive hypertrophic cardiomyopathy: Secondary | ICD-10-CM

## 2021-11-20 NOTE — Telephone Encounter (Signed)
-----   Message from Thayer Headings, MD sent at 11/19/2021  8:01 PM EDT ----- ?Normal LV systolic function ?Findings c/w HOCM ?Please refer her to Dr. Gasper Sells in the next month or so or first available ? ?

## 2021-11-20 NOTE — Telephone Encounter (Signed)
Pt aware of ECHO results. Message sent to [facility] at this time and Dr Oralia Rud covering RN to get pt scheduled. Referral placed.  ?

## 2021-12-20 NOTE — Progress Notes (Unsigned)
Cardiology Office Note:    Date:  12/21/2021   ID:  Stacy [name], DOB 1929-05-24, MRN [id number]  PCP:  [name], MD   [facility] Providers Cardiologist:  None     Referring MD: Thayer Headings, MD   CC: Symptomatic oHCM Consulted for the evaluation of symptomatic oHCM at the behest of Dr. Acie Fredrickson  History of Present Illness:    Stacy [name] is a 88 y.o. female with a hx of oHCM (Peak gradient 16 mm Hg, EF 60-65%, 17 mm septal thickness by echo 11/2021, PAF (CHADSVASC NA) who presents for evaluation.  Patient notes that she feels much better now that she is on irbesartan. She had HTN Emergency with BP 200/100 and felt terrible. On the Irbesartan she feels much better. 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 Notes some fatigue. Notes no palpitations Notes rare pain under her right breast.  Pain lasted 15-20 minutes.   Notes no dizziness. Had a mechanical fall after bending to get something 1st of May (BP was very high at that time) Notes no syncope. Notable family events include daughter starting mavacamten.  Past Medical History:  Diagnosis Date   Bowel obstruction (HCC)    Diastolic dysfunction    Gallbladder disease    GERD (gastroesophageal reflux disease)    HOCM (hypertrophic obstructive cardiomyopathy) ([place]) 09/27/2016   Echo 10/16: severe LVH, EF 55-60, peak 32, no RWMA, +SAM, mild to mod MR, severe LAE, PASP 28 // Echo 2/18: mod conc LVH, severe septal LVH c/w HCM, EF 60-65, peak LVOT 94, no RWMA, + SAM, severe MR, severe LAE, PASP 25 // Echo 6/18: severe conc LVH, EF 60-65, noRWMA, Gr 2 DD, peak LVOT gradient 20, mild aortic stenosis, mild AI, mild MR, severe LAE, mild TR, PASP 33    Hypertension    LVH (left ventricular hypertrophy)    WITH ASYMMETRIC SEPTAL HYPERTROPHY   Mitral regurgitation 09/09/2016   Severe by echo 08/2016   Persistent atrial fibrillation ([place]) 09/09/2016   Rheumatoid arthritis [facility])     Past Surgical  History:  Procedure Laterality Date   CARDIOVASCULAR STRESS TEST  03/10/2006   EF 69%. NO EVIDENCE OF ISCHEMIA   CARDIOVERSION N/A 03/21/2018   Procedure: Cancelled Cardioversion;  Surgeon: Thayer Headings, MD;  Location: [place];  Service: Cardiovascular;  Laterality: N/A;   CHOLECYSTECTOMY     TOTAL HIP ARTHROPLASTY     US ECHOCARDIOGRAPHY  02/21/2008   EF 55-60%   US ECHOCARDIOGRAPHY  07/21/2005   EF 55-60%    Current Medications: Current Meds  Medication Sig   acetaminophen (TYLENOL) 325 MG tablet Take 650 mg by mouth every 6 (six) hours as needed for moderate pain or headache.   amiodarone (PACERONE) 200 MG tablet Take one tablet by mouth on Monday and Thursday   apixaban (ELIQUIS) 5 MG TABS tablet Take 1 tablet (5 mg total) by mouth 2 (two) times daily.   Ascorbic Acid (VITAMIN C) 1000 MG tablet Take 2,000 mg by mouth daily.   BIOTIN PO Take 1 capsule by mouth daily.   Cholecalciferol (VITAMIN D-3 PO) Take 1 tablet by mouth daily.   furosemide (LASIX) 20 MG tablet Take 1 tablet (20 mg total) by mouth every Monday, Wednesday, and Friday.   hydrALAZINE (APRESOLINE) 25 MG tablet Take 1 tablet 3 times daily if BP >140   irbesartan (AVAPRO) 300 MG tablet Take 1 tablet (300 mg total) by mouth daily.   meclizine (ANTIVERT) 25  MG tablet Take 25 mg by mouth daily as needed for dizziness.    methotrexate 2.5 MG tablet Take 15 mg by mouth once a week.   metoprolol tartrate (LOPRESSOR) 25 MG tablet Take 1 tablet (25 mg total) by mouth 2 (two) times daily.   Polyvinyl Alcohol-Povidone (REFRESH OP) Place 1 drop into both eyes as needed (dry eyes).   potassium chloride (KLOR-CON) 10 MEQ tablet Take 1 tablet (10 mEq total) by mouth every Monday, Wednesday, and Friday.   PROAIR RESPICLICK 852 (90 Base) MCG/ACT AEPB Inhale 2 puffs into the lungs every 6 (six) hours as needed (wheezing, shortnes of breath).   rOPINIRole (REQUIP) 1 MG tablet Take 1 mg by mouth at bedtime.    zinc gluconate 50 MG  tablet Take 50 mg by mouth daily.     Allergies:   Sulfa antibiotics   Social History   Socioeconomic History   Marital status: Widowed    Spouse name: Not on file   Number of children: Not on file   Years of education: Not on file   Highest education level: Not on file  Occupational History   Not on file  Tobacco Use   Smoking status: Never   Smokeless tobacco: Never  Vaping Use   Vaping Use: Never used  Substance and Sexual Activity   Alcohol use: No   Drug use: No   Sexual activity: Not on file  Other Topics Concern   Not on file  Social History Narrative   Not on file   Social Determinants of Health   Financial Resource Strain: Not on file  Food Insecurity: Not on file  Transportation Needs: Not on file  Physical Activity: Not on file  Stress: Not on file  Social Connections: Not on file     Family History: The patient's family history includes Breast cancer in her daughter; Heart attack in her brother; Hypertension in her mother; Leukemia in her father; Stroke in her mother.  ROS:   Please see the history of present illness.     All other systems reviewed and are negative.  EKGs/Labs/Other Studies Reviewed:    The following studies were reviewed today:  Recent Labs: 09/08/2021: BUN 11; Creatinine, Ser 0.71; Hemoglobin 13.5; Platelets 136; Potassium 4.0; Sodium 134  Recent Lipid Panel    Component Value Date/Time   CHOL 185 09/10/2016 0012   TRIG 136 09/10/2016 0012   HDL 43 09/10/2016 0012   CHOLHDL 4.3 09/10/2016 0012   VLDL 27 09/10/2016 0012   LDLCALC 115 (H) 09/10/2016 0012        Physical Exam:    VS:  BP 140/78   Pulse 60   Ht 5' (1.524 m)   Wt 136 lb 6.4 oz (61.9 kg)   LMP  (LMP Unknown)   SpO2 98%   BMI 26.64 kg/m     Wt Readings from Last 3 Encounters:  12/21/21 136 lb 6.4 oz (61.9 kg)  11/06/21 136 lb 6.4 oz (61.9 kg)  09/08/21 150 lb (68 kg)     Gen: No distress, Elderly female  Neck: No JVD Cardiac: No Rubs or Gallops,  systolic murmur with handgrip, RRR +2 radial pulses Respiratory: Clear to auscultation bilaterally, normal effort, normal  respiratory rate GI: Soft, nontender, non-distended  MS: No edema;  moves all extremities, walks with cane with no issues Integument: Skin feels warm Neuro:  At time of evaluation, alert and oriented to person/place/time/situation  Psych: Normal affect, patient feels well   ASSESSMENT:  1. Hypertrophic cardiomyopathy ([place])   2. Paroxysmal atrial fibrillation ([place])   3. Mitral valve insufficiency, unspecified etiology    PLAN:    Hypertrophic Cardiomyopathy Atrial fibrillation and Mitral regurgitation with severe MAC - peak gradient 16 mm Hg on 2023 echo  - NYHA I  - Family history notable for daughter who has the disease who is seen in our clinic  - presence  of atrial fibrillation (CHADSVACS NA) on eliquis and amiodarone  - we have discussed that given her age and lack of VT, we would not pursue SCD evaluation at this tinme; shared decision making with patient and family  - 20 mg  Lasix 3X week has kept her sx under control   We have discussed that if she has new symptoms with a severe LVOT gradient we would prefer medication surgery or ASA given her age.  Can see HCM Clinic PRN for worsening sx   Medication Adjustments/Labs and Tests Ordered: Current medicines are reviewed at length with the patient today.  Concerns regarding medicines are outlined above.  No orders of the defined types were placed in this encounter.  No orders of the defined types were placed in this encounter.   Patient Instructions  Medication Instructions:  Your physician recommends that you continue on your current medications as directed. Please refer to the Current Medication list given to you today.  *If you need a refill on your cardiac medications before your next appointment, please call your pharmacy*   Lab Work: NONE If you have labs (blood work) drawn today and  your tests are completely normal, you will receive your results only by: [place] (if you have MyChart) OR A paper copy in the mail If you have any lab test that is abnormal or we need to change your treatment, we will call you to review the results.   Testing/Procedures: NONE   Follow-Up:As needed At [facility], you and your health needs are our priority.  As part of our continuing mission to provide you with exceptional heart care, we have created designated Provider Care Teams.  These Care Teams include your primary Cardiologist (physician) and Advanced Practice Providers (APPs -  Physician Assistants and Nurse Practitioners) who all work together to provide you with the care you need, when you need it.  We recommend signing up for the patient portal called "MyChart".  Sign up information is provided on this After Visit Summary.  MyChart is used to connect with patients for Virtual Visits (Telemedicine).  Patients are able to view lab/test results, encounter notes, upcoming appointments, etc.  Non-urgent messages can be sent to your provider as well.   To learn more about what you can do with MyChart, go to [email or web address].    Provider:   Dr. Acie Fredrickson  Important Information About Sugar         Signed, Werner Lean, MD  12/21/2021 2:55 PM    [place]

## 2021-12-21 ENCOUNTER — Encounter: Payer: Self-pay | Admitting: Internal Medicine

## 2021-12-21 ENCOUNTER — Ambulatory Visit: Payer: Medicare Other | Admitting: Internal Medicine

## 2021-12-21 VITALS — BP 140/78 | HR 60 | Ht 60.0 in | Wt 136.4 lb

## 2021-12-21 DIAGNOSIS — I480 Paroxysmal atrial fibrillation: Secondary | ICD-10-CM

## 2021-12-21 DIAGNOSIS — I340 Nonrheumatic mitral (valve) insufficiency: Secondary | ICD-10-CM

## 2021-12-21 DIAGNOSIS — I422 Other hypertrophic cardiomyopathy: Secondary | ICD-10-CM | POA: Diagnosis not present

## 2021-12-21 NOTE — Patient Instructions (Signed)
Medication Instructions:  Your physician recommends that you continue on your current medications as directed. Please refer to the Current Medication list given to you today.  *If you need a refill on your cardiac medications before your next appointment, please call your pharmacy*   Lab Work: NONE If you have labs (blood work) drawn today and your tests are completely normal, you will receive your results only by: [place] (if you have MyChart) OR A paper copy in the mail If you have any lab test that is abnormal or we need to change your treatment, we will call you to review the results.   Testing/Procedures: NONE   Follow-Up:As needed At [facility], you and your health needs are our priority.  As part of our continuing mission to provide you with exceptional heart care, we have created designated Provider Care Teams.  These Care Teams include your primary Cardiologist (physician) and Advanced Practice Providers (APPs -  Physician Assistants and Nurse Practitioners) who all work together to provide you with the care you need, when you need it.  We recommend signing up for the patient portal called "MyChart".  Sign up information is provided on this After Visit Summary.  MyChart is used to connect with patients for Virtual Visits (Telemedicine).  Patients are able to view lab/test results, encounter notes, upcoming appointments, etc.  Non-urgent messages can be sent to your provider as well.   To learn more about what you can do with MyChart, go to [email or web address].    Provider:   Dr. Acie Fredrickson  Important Information About Sugar

## 2021-12-24 ENCOUNTER — Encounter: Payer: Self-pay | Admitting: *Deleted

## 2021-12-25 ENCOUNTER — Encounter: Payer: Self-pay | Admitting: Neurology

## 2021-12-25 ENCOUNTER — Ambulatory Visit: Payer: Medicare Other | Admitting: Neurology

## 2021-12-25 VITALS — BP 145/77 | HR 76 | Ht 60.0 in | Wt 138.0 lb

## 2021-12-25 DIAGNOSIS — M5481 Occipital neuralgia: Secondary | ICD-10-CM

## 2021-12-25 DIAGNOSIS — R42 Dizziness and giddiness: Secondary | ICD-10-CM

## 2021-12-25 NOTE — Progress Notes (Signed)
[facility]  PATIENT: Stacy [name] DOB: August 24, 1928  REQUESTING CLINICIAN: [name], MD HISTORY FROM: Patient and daughter  REASON FOR VISIT: Dizziness/Vertigo    HISTORICAL  CHIEF COMPLAINT:  Chief Complaint  Patient presents with   New Patient (Initial Visit)    Room 13 NP/Paper/Douglas Delena Bali MD [facility] IM [phone] with fall/ Reports dizziness throughout the day meclizine is not helpful      HISTORY OF PRESENT ILLNESS:  This is a 88 year old woman past medical history of hypertension, atrial fibrillation, restless leg syndrome who is presenting with complaint of dizziness.  Patient reports having dizziness for more than a year, dizziness described as occasional spinning sensation with change in position.  She had tried meclizine in the past without clear benefit.  Patient reports that she always get dizzy first thing in the morning, she has to sit at the edge of the bed for few minutes prior to walking.  She also reported dizziness with sudden onset of movement, reports several weeks ago she squat down and has a severe room spinning sensation and fell down.  That was her last fall.  She did had MRI brain done on May 1 and it was negative for any acute intracranial abnormality but showed confluent white matter disease.  She also try vestibular therapy but has to stop after 4 sessions due to being infected with COVID.  She is interested in restarting PT. She also complains of headache, stated that she had a occipital region shooting pain that lasted for 3 weeks and went away.  Currently she is pain-free. For her restless leg syndrome she is on Requip.    OTHER MEDICAL CONDITIONS: Hypertension, heart disease, atrial fibrillation, RLS    REVIEW OF SYSTEMS: Full 14 system review of systems performed and negative with exception of: as noted in the HPI   ALLERGIES: Allergies  Allergen Reactions   Sulfa Antibiotics Nausea Only     HOME MEDICATIONS: Outpatient Medications Prior to Visit  Medication Sig Dispense Refill   acetaminophen (TYLENOL) 325 MG tablet Take 650 mg by mouth every 6 (six) hours as needed for moderate pain or headache.     amiodarone (PACERONE) 200 MG tablet Take one tablet by mouth on Monday and Thursday 30 tablet 3   apixaban (ELIQUIS) 5 MG TABS tablet Take 1 tablet (5 mg total) by mouth 2 (two) times daily. 180 tablet 3   Ascorbic Acid (VITAMIN C) 1000 MG tablet Take 2,000 mg by mouth daily.     BIOTIN PO Take 1 capsule by mouth daily.     Cholecalciferol (VITAMIN D-3 PO) Take 1 tablet by mouth daily.     furosemide (LASIX) 20 MG tablet Take 1 tablet (20 mg total) by mouth every Monday, Wednesday, and Friday. 36 tablet 3   hydrALAZINE (APRESOLINE) 25 MG tablet Take 1 tablet 3 times daily if BP >140 270 tablet 3   irbesartan (AVAPRO) 300 MG tablet Take 1 tablet (300 mg total) by mouth daily. 90 tablet 3   meclizine (ANTIVERT) 25 MG tablet Take 25 mg by mouth daily as needed for dizziness.      methotrexate 2.5 MG tablet Take 15 mg by mouth once a week.     metoprolol tartrate (LOPRESSOR) 25 MG tablet Take 1 tablet (25 mg total) by mouth 2 (two) times daily. 180 tablet 2   Polyvinyl Alcohol-Povidone (REFRESH OP) Place 1 drop into both eyes as needed (dry eyes).     potassium chloride (KLOR-CON)  10 MEQ tablet Take 1 tablet (10 mEq total) by mouth every Monday, Wednesday, and Friday. 36 tablet 3   PROAIR RESPICLICK 510 (90 Base) MCG/ACT AEPB Inhale 2 puffs into the lungs every 6 (six) hours as needed (wheezing, shortnes of breath).     rOPINIRole (REQUIP) 1 MG tablet Take 1 mg by mouth at bedtime.      zinc gluconate 50 MG tablet Take 50 mg by mouth daily.     No facility-administered medications prior to visit.    PAST MEDICAL HISTORY: Past Medical History:  Diagnosis Date   Bowel obstruction (HCC)    Diastolic dysfunction    Dizziness    Gallbladder disease    GERD (gastroesophageal  reflux disease)    HOCM (hypertrophic obstructive cardiomyopathy) ([place]) 09/27/2016   Echo 10/16: severe LVH, EF 55-60, peak 32, no RWMA, +SAM, mild to mod MR, severe LAE, PASP 28 // Echo 2/18: mod conc LVH, severe septal LVH c/w HCM, EF 60-65, peak LVOT 94, no RWMA, + SAM, severe MR, severe LAE, PASP 25 // Echo 6/18: severe conc LVH, EF 60-65, noRWMA, Gr 2 DD, peak LVOT gradient 20, mild aortic stenosis, mild AI, mild MR, severe LAE, mild TR, PASP 33    Hypertension    Hypertrophic obstructive cardiomyopathy with diastolic heart failure (HCC)    LVH (left ventricular hypertrophy)    WITH ASYMMETRIC SEPTAL HYPERTROPHY   Mitral regurgitation 09/09/2016   Severe by echo 08/2016   Persistent atrial fibrillation ([place]) 09/09/2016   Rheumatoid arthritis (HCC)    RLS (restless legs syndrome)     PAST SURGICAL HISTORY: Past Surgical History:  Procedure Laterality Date   ABDOMINAL HYSTERECTOMY     APPENDECTOMY     CARDIOVASCULAR STRESS TEST  03/10/2006   EF 69%. NO EVIDENCE OF ISCHEMIA   CARDIOVERSION N/A 03/21/2018   Procedure: Cancelled Cardioversion;  Surgeon: Acie Fredrickson Wonda Cheng, MD;  Location: [place];  Service: Cardiovascular;  Laterality: N/A;   CATARACT EXTRACTION     CHOLECYSTECTOMY     TOTAL HIP ARTHROPLASTY     US ECHOCARDIOGRAPHY  02/21/2008   EF 55-60%   US ECHOCARDIOGRAPHY  07/21/2005   EF 55-60%    FAMILY HISTORY: Family History  Problem Relation Age of Onset   Hypertension Mother    Stroke Mother    Melanoma Mother    Leukemia Father    Stroke Sister    Hypertension Sister    Alcohol abuse Brother    Heart attack Brother    Breast cancer Daughter     SOCIAL HISTORY: Social History   Socioeconomic History   Marital status: Widowed    Spouse name: Not on file   Number of children: 3   Years of education: Not on file   Highest education level: Not on file  Occupational History   Not on file  Tobacco Use   Smoking status: Never   Smokeless tobacco: Never   Vaping Use   Vaping Use: Never used  Substance and Sexual Activity   Alcohol use: No   Drug use: No   Sexual activity: Not on file  Other Topics Concern   Not on file  Social History Narrative   12/24/21 lives with relative   Social Determinants of Health   Financial Resource Strain: Not on file  Food Insecurity: Not on file  Transportation Needs: Not on file  Physical Activity: Not on file  Stress: Not on file  Social Connections: Not on file  Intimate Partner Violence: Not  on file    PHYSICAL EXAM  GENERAL EXAM/CONSTITUTIONAL: Vitals:  Vitals:   12/25/21 1013  BP: (!) 145/77  Pulse: 76  Weight: 138 lb (62.6 kg)  Height: 5' (1.524 m)   Body mass index is 26.95 kg/m. Wt Readings from Last 3 Encounters:  12/25/21 138 lb (62.6 kg)  12/21/21 136 lb 6.4 oz (61.9 kg)  11/06/21 136 lb 6.4 oz (61.9 kg)   Patient is in no distress; well developed, nourished and groomed; neck is supple  EYES: Pupils round and reactive to light, Visual fields full to confrontation, Extraocular movements intacts,   MUSCULOSKELETAL: Gait, strength, tone, movements noted in Neurologic exam below  NEUROLOGIC: MENTAL STATUS:      No data to display         awake, alert, oriented to person, place and time recent and remote memory intact normal attention and concentration language fluent, comprehension intact, naming intact fund of knowledge appropriate  CRANIAL NERVE:  2nd, 3rd, 4th, 6th - pupils equal and reactive to light, visual fields full to confrontation, extraocular muscles intact, no nystagmus 5th - facial sensation symmetric 7th - facial strength symmetric 8th - hearing intact 9th - palate elevates symmetrically, uvula midline 11th - shoulder shrug symmetric 12th - tongue protrusion midline  MOTOR:  normal bulk and tone, full strength in the BUE, BLE  SENSORY:  normal and symmetric to light touch, and vibration  COORDINATION:  finger-nose-finger, fine finger  movements normal  REFLEXES:  deep tendon reflexes present and symmetric  GAIT/STATION:  normal   DIAGNOSTIC DATA (LABS, IMAGING, TESTING) - I reviewed patient records, labs, notes, testing and imaging myself where available.  Lab Results  Component Value Date   WBC 4.8 09/08/2021   HGB 13.5 09/08/2021   HCT 42.0 09/08/2021   MCV 95.7 09/08/2021   PLT 136 (L) 09/08/2021      Component Value Date/Time   NA 134 (L) 09/08/2021 0213   NA 138 07/22/2020 1231   K 4.0 09/08/2021 0213   CL 101 09/08/2021 0213   CO2 24 09/08/2021 0213   GLUCOSE 106 (H) 09/08/2021 0213   BUN 11 09/08/2021 0213   BUN 18 07/22/2020 1231   CREATININE 0.71 09/08/2021 0213   CALCIUM 8.8 (L) 09/08/2021 0213   PROT 6.6 04/25/2018 1817   ALBUMIN 3.7 04/25/2018 1817   AST 24 04/25/2018 1817   ALT 17 04/25/2018 1817   ALKPHOS 73 04/25/2018 1817   BILITOT 0.7 04/25/2018 1817   GFRNONAA >60 09/08/2021 0213   GFRAA 65 07/22/2020 1231   Lab Results  Component Value Date   CHOL 185 09/10/2016   HDL 43 09/10/2016   LDLCALC 115 (H) 09/10/2016   TRIG 136 09/10/2016   CHOLHDL 4.3 09/10/2016   No results found for: "HGBA1C" No results found for: "VITAMINB12" Lab Results  Component Value Date   TSH 2.540 07/22/2020    MRI brain 11/16/21 No acute intracranial abnormality Confluent white matter disease, worsened from the prior study.  This is most commonly due to chronic ischemic microangiopathy    ASSESSMENT AND PLAN  89 y.o. year old female with history of hypertension, atrial fibrillation, restless leg syndrome who is presenting with dizziness for the past year described as room spinning sensation lasting a few minutes.  This is likely consistent with vertigo, probably positional.  Her brain MRI was negative for any acute abnormality and the meclizine was not helpful per patient.  At this point I will resend patient to vestibular rehab, she  started it once but has to stop due to COVID and she is  interested in restarting it.  I also advised patient to continue with her behavioral modification, to sit at the edge of the bed before walking first thing in the morning and also to count until 10 upon standing prior to taking her first steps.  Also advised her to continue using her cane. For the headaches, currently she is pain-free but per description it sounds more like occipital neuralgia.  Advised patient and daughter to contact me or her primary care doctor if the pain reoccurs, at that time we can try her on some neuropathic pain medicine.  They are comfortable with plan return as needed    1. Vertigo   2. Occipital neuralgia of left side      Patient Instructions  Continue current medication Referral to vestibular rehab Follow-up as needed  Orders Placed This Encounter  Procedures   PT vestibular rehab    No orders of the defined types were placed in this encounter.   Return if symptoms worsen or fail to improve.  I have spent a total of 50 minutes dedicated to this patient today, preparing to see patient, performing a medically appropriate examination and evaluation, ordering tests and/or medications and procedures, and counseling and educating the patient/family/caregiver; independently interpreting result and communicating results to the family/patient/caregiver; and documenting clinical information in the electronic medical record.   Alric Ran, MD 12/25/2021, 11:07 AM  [facility] [address], [place], [place] [address] [phone]

## 2021-12-25 NOTE — Patient Instructions (Signed)
Continue current medication Referral to vestibular rehab Follow-up as needed

## 2022-01-13 DIAGNOSIS — M545 Low back pain, unspecified: Secondary | ICD-10-CM | POA: Diagnosis not present

## 2022-01-13 DIAGNOSIS — R1031 Right lower quadrant pain: Secondary | ICD-10-CM | POA: Diagnosis not present

## 2022-01-13 DIAGNOSIS — R0609 Other forms of dyspnea: Secondary | ICD-10-CM | POA: Diagnosis not present

## 2022-02-09 DIAGNOSIS — M069 Rheumatoid arthritis, unspecified: Secondary | ICD-10-CM | POA: Diagnosis not present

## 2022-02-09 DIAGNOSIS — R42 Dizziness and giddiness: Secondary | ICD-10-CM | POA: Diagnosis not present

## 2022-02-09 DIAGNOSIS — I5030 Unspecified diastolic (congestive) heart failure: Secondary | ICD-10-CM | POA: Diagnosis not present

## 2022-02-09 DIAGNOSIS — I340 Nonrheumatic mitral (valve) insufficiency: Secondary | ICD-10-CM | POA: Diagnosis not present

## 2022-02-09 DIAGNOSIS — I482 Chronic atrial fibrillation, unspecified: Secondary | ICD-10-CM | POA: Diagnosis not present

## 2022-02-09 DIAGNOSIS — I10 Essential (primary) hypertension: Secondary | ICD-10-CM | POA: Diagnosis not present

## 2022-02-09 DIAGNOSIS — Z79899 Other long term (current) drug therapy: Secondary | ICD-10-CM | POA: Diagnosis not present

## 2022-02-09 DIAGNOSIS — G2581 Restless legs syndrome: Secondary | ICD-10-CM | POA: Diagnosis not present

## 2022-02-09 DIAGNOSIS — M7062 Trochanteric bursitis, left hip: Secondary | ICD-10-CM | POA: Diagnosis not present

## 2022-02-09 DIAGNOSIS — I421 Obstructive hypertrophic cardiomyopathy: Secondary | ICD-10-CM | POA: Diagnosis not present

## 2022-02-11 DIAGNOSIS — M25552 Pain in left hip: Secondary | ICD-10-CM | POA: Diagnosis not present

## 2022-02-11 DIAGNOSIS — M159 Polyosteoarthritis, unspecified: Secondary | ICD-10-CM | POA: Diagnosis not present

## 2022-02-18 DIAGNOSIS — M25552 Pain in left hip: Secondary | ICD-10-CM | POA: Diagnosis not present

## 2022-02-18 DIAGNOSIS — M159 Polyosteoarthritis, unspecified: Secondary | ICD-10-CM | POA: Diagnosis not present

## 2022-02-22 DIAGNOSIS — M25512 Pain in left shoulder: Secondary | ICD-10-CM | POA: Diagnosis not present

## 2022-02-22 DIAGNOSIS — M0609 Rheumatoid arthritis without rheumatoid factor, multiple sites: Secondary | ICD-10-CM | POA: Diagnosis not present

## 2022-02-22 DIAGNOSIS — M810 Age-related osteoporosis without current pathological fracture: Secondary | ICD-10-CM | POA: Diagnosis not present

## 2022-02-22 DIAGNOSIS — G629 Polyneuropathy, unspecified: Secondary | ICD-10-CM | POA: Diagnosis not present

## 2022-02-22 DIAGNOSIS — M1991 Primary osteoarthritis, unspecified site: Secondary | ICD-10-CM | POA: Diagnosis not present

## 2022-02-22 DIAGNOSIS — M5136 Other intervertebral disc degeneration, lumbar region: Secondary | ICD-10-CM | POA: Diagnosis not present

## 2022-02-26 ENCOUNTER — Other Ambulatory Visit: Payer: Self-pay | Admitting: Cardiovascular Disease

## 2022-02-26 NOTE — Telephone Encounter (Signed)
Prescription refill request for Eliquis received. Indication:Afib Last office visit:4/23 Scr:0.7 Age: 88 Weight:62.9 kg  Prescription refilled

## 2022-04-19 DIAGNOSIS — D23111 Other benign neoplasm of skin of right upper eyelid, including canthus: Secondary | ICD-10-CM | POA: Diagnosis not present

## 2022-05-06 DIAGNOSIS — Z23 Encounter for immunization: Secondary | ICD-10-CM | POA: Diagnosis not present

## 2022-05-06 DIAGNOSIS — I5030 Unspecified diastolic (congestive) heart failure: Secondary | ICD-10-CM | POA: Diagnosis not present

## 2022-05-06 DIAGNOSIS — I340 Nonrheumatic mitral (valve) insufficiency: Secondary | ICD-10-CM | POA: Diagnosis not present

## 2022-05-06 DIAGNOSIS — I482 Chronic atrial fibrillation, unspecified: Secondary | ICD-10-CM | POA: Diagnosis not present

## 2022-05-06 DIAGNOSIS — M069 Rheumatoid arthritis, unspecified: Secondary | ICD-10-CM | POA: Diagnosis not present

## 2022-05-06 DIAGNOSIS — I421 Obstructive hypertrophic cardiomyopathy: Secondary | ICD-10-CM | POA: Diagnosis not present

## 2022-05-06 DIAGNOSIS — R350 Frequency of micturition: Secondary | ICD-10-CM | POA: Diagnosis not present

## 2022-05-12 NOTE — Progress Notes (Unsigned)
Office Visit    Patient Name: Stacy [name] Date of Encounter: 05/13/2022  Primary Care Provider:  [name], MD Primary Cardiologist:  [name], MD Primary Electrophysiologist: None  Chief Complaint    Stacy [name] is a 88 y.o. female with PMH of HOCM, HLD, HFpEF, PAF (on Eliquis), HTN, mitral regurgitation who presents today for 76-monthfollow-up of atrial fibrillation and CHF.  Past Medical History    Past Medical History:  Diagnosis Date   Bowel obstruction (HCC)    Diastolic dysfunction    Dizziness    Gallbladder disease    GERD (gastroesophageal reflux disease)    HOCM (hypertrophic obstructive cardiomyopathy) ([place] 09/[place]   Echo 10/16: severe LVH, EF 55-60, peak 32, no RWMA, +SAM, mild to mod MR, severe LAE, PASP 28 // Echo 2/18: mod conc LVH, severe septal LVH c/w HCM, EF 60-65, peak LVOT 94, no RWMA, + SAM, severe MR, severe LAE, PASP 25 // Echo 6/18: severe conc LVH, EF 60-65, noRWMA, Gr 2 DD, peak LVOT gradient 20, mild aortic stenosis, mild AI, mild MR, severe LAE, mild TR, PASP 33    Hypertension    Hypertrophic obstructive cardiomyopathy with diastolic heart failure (HCC)    LVH (left ventricular hypertrophy)    WITH ASYMMETRIC SEPTAL HYPERTROPHY   Mitral regurgitation 09/09/2016   Severe by echo 08/2016   [place]   Rheumatoid arthritis (HCC)    RLS (restless legs syndrome)    Past Surgical History:  Procedure Laterality Date   ABDOMINAL HYSTERECTOMY     APPENDECTOMY     CARDIOVASCULAR STRESS TEST  03/10/2006   EF 69%. NO EVIDENCE OF ISCHEMIA   CARDIOVERSION N/A 03/21/2018   Procedure: Cancelled Cardioversion;  Surgeon: NAcie FredricksonPWonda Cheng MD;  [facility];  Service: Cardiovascular;  Laterality: N/A;   CATARACT EXTRACTION     CHOLECYSTECTOMY     TOTAL HIP ARTHROPLASTY     [place]  02/21/2008   EF 55-60%   [place]  07/21/2005   EF 55-60%     Allergies  Allergies  Allergen Reactions   Sulfa Antibiotics Nausea Only    History of Present Illness    Stacy [name] is a 88year old female with the above mention past medical history who presents today for follow-up of CHF and atrial fibrillation.  Ms. CLochwas initially seen by Dr. NAcie Fredricksonin 2012 for management of hyperlipidemia.  She had a previous nuclear stress test completed 2007 that showed no ischemia.  She was admitted in 2018 with AF with RVR and was placed on Cardizem drip and anticoagulated with Eliquis with eventual conversion.  2D echo was completed with EF of 60 to 65%, moderate concentric LVH with severe septal hypertrophy and HCM normal wall motion.  She underwent subsequent 2D echo that revealedEcho showed normal LV function, severe dilated LA, severe MR, PA pressure of 23mHG.  She was seen in follow-up on 02/2018 with symptomatic atrial fibrillation and was placed on amiodarone 200 mg twice a day with plan for DCCV.  She underwent DCCV 03/21/2018 to sinus rhythm. She was last seen by Dr. ChGasper Sellsn 12/2021 for consultation and management of hypertrophic cardiomyopathy.  2D echo completed 11/2021 with EF of 60-65% and 17 mm septal thickness.  She was noted to be on irbesartan due to elevated blood pressures and was currently doing well.  The decision was made due to patient's advanced age to not pursue ICD  evaluation.  [name] presents today for 49-monthfollow-up alone.  Since last being seen in the office patient reports she has been doing well with no complaints of dizziness or palpitations.  She does note an occasional bout of shortness of breath that is fleeting and relieved spontaneously.  She has not had to use her as needed dose of beta-blocker over the last several months.  She is euvolemic on examination today and denies any adverse reactions with her current medication regimen.  Her blood pressure currently is normal at 120/82.  She is tolerating her  Avapro without any adverse reactions.  She reports increased urinary frequency many days that she is not using Lasix.  I advised her to follow-up with her PCP regarding possible UTI.  Patient denies chest pain, palpitations, dyspnea, PND, orthopnea, nausea, vomiting, dizziness, syncope, edema, weight gain, or early satiety.    Home Medications    Current Outpatient Medications  Medication Sig Dispense Refill   acetaminophen (TYLENOL) 325 MG tablet Take 650 mg by mouth every 6 (six) hours as needed for moderate pain or headache.     amiodarone (PACERONE) 200 MG tablet Take one tablet by mouth on Monday and Thursday 30 tablet 3   apixaban (ELIQUIS) 5 MG TABS tablet TAKE ONE TABLET BY MOUTH TWICE DAILY 180 tablet 1   Ascorbic Acid (VITAMIN C) 1000 MG tablet Take 2,000 mg by mouth daily.     BIOTIN PO Take 1 capsule by mouth daily.     Cholecalciferol (VITAMIN D-3 PO) Take 1 tablet by mouth daily.     furosemide (LASIX) 20 MG tablet Take 1 tablet (20 mg total) by mouth every Monday, Wednesday, and Friday. 36 tablet 3   hydrALAZINE (APRESOLINE) 25 MG tablet Take 1 tablet 3 times daily if BP >140 270 tablet 3   irbesartan (AVAPRO) 300 MG tablet Take 1 tablet (300 mg total) by mouth daily. 90 tablet 3   meclizine (ANTIVERT) 25 MG tablet Take 1 tablet (25 mg total) by mouth daily as needed for dizziness. 30 tablet 3   methotrexate 2.5 MG tablet Take 15 mg by mouth once a week.     metoprolol tartrate (LOPRESSOR) 25 MG tablet Take 1 tablet (25 mg total) by mouth 2 (two) times daily. 180 tablet 2   Polyvinyl Alcohol-Povidone (REFRESH OP) Place 1 drop into both eyes as needed (dry eyes).     potassium chloride (KLOR-CON) 10 MEQ tablet Take 1 tablet (10 mEq total) by mouth every Monday, Wednesday, and Friday. 36 tablet 3   PROAIR RESPICLICK 1269(90 Base) MCG/ACT AEPB Inhale 2 puffs into the lungs every 6 (six) hours as needed (wheezing, shortnes of breath).     rOPINIRole (REQUIP) 1 MG tablet Take 1 mg by  mouth at bedtime.      zinc gluconate 50 MG tablet Take 50 mg by mouth daily.     No current facility-administered medications for this visit.     Review of Systems  Please see the history of present illness.    (+) Occasional dizziness (+) Urinary frequency  All other systems reviewed and are otherwise negative except as noted above.  Physical Exam    Wt Readings from Last 3 Encounters:  05/13/22 142 lb (64.4 kg)  12/25/21 138 lb (62.6 kg)  12/21/21 136 lb 6.4 oz (61.9 kg)   VS: Vitals:   05/13/22 1329  BP: 120/82  Pulse: (!) 58  SpO2: 96%  ,Body mass index is 27.73 kg/m.  Constitutional:  Appearance: Healthy appearance. Not in distress.  Neck:     Vascular: JVD normal.  Pulmonary:     Effort: Pulmonary effort is normal.     Breath sounds: No wheezing. No rales. Diminished in the bases Cardiovascular:     Normal rate. Regular rhythm. Normal S1. Normal S2.      Murmurs: There is no murmur.  Edema:    Peripheral edema absent.  Abdominal:     Palpations: Abdomen is soft non tender. There is no hepatomegaly.  Skin:    General: Skin is warm and dry.  Neurological:     General: No focal deficit present.     Mental Status: Alert and oriented to person, place and time.     Cranial Nerves: Cranial nerves are intact.  EKG/LABS/Other Studies Reviewed    ECG personally reviewed by me today -none completed today  Risk Assessment/Calculations:    CHA2DS2-VASc Score = 6   This indicates a 9.7% annual risk of stroke. The patient's score is based upon: CHF History: 1 HTN History: 1 Diabetes History: 0 Stroke History: 0 Vascular Disease History: 1 Age Score: 2 Gender Score: 1           Lab Results  Component Value Date   WBC 4.8 09/08/2021   HGB 13.5 09/08/2021   HCT 42.0 09/08/2021   MCV 95.7 09/08/2021   PLT 136 (L) 09/08/2021   Lab Results  Component Value Date   CREATININE 0.71 09/08/2021   BUN 11 09/08/2021   NA 134 (L) 09/08/2021   K 4.0  09/08/2021   CL 101 09/08/2021   CO2 24 09/08/2021   Lab Results  Component Value Date   ALT 17 04/25/2018   AST 24 04/25/2018   ALKPHOS 73 04/25/2018   BILITOT 0.7 04/25/2018   Lab Results  Component Value Date   CHOL 185 09/10/2016   HDL 43 09/10/2016   LDLCALC 115 (H) 09/10/2016   TRIG 136 09/10/2016   CHOLHDL 4.3 09/10/2016    No results found for: "HGBA1C"  Assessment & Plan    1.  Paroxysmal atrial fibrillation: -Patient currently is on rate control with amiodarone 200 mg -Today she reports no recurrences of sustained palpitations or tachycardia -Continue Eliquis 5 mg twice daily -Patient not eligible for dose reduction due to weight and creatinine  2.  Hypertrophic obstructive cardiomyopathy: -Patient had recent consultation by Dr. Gasper Sells -2D echo completed 11/2021 with EF of 60-65% and 17 mm septal thickness.  -Decision was made not to pursue further work-up.  3.  Essential hypertension: -Patient's blood pressure today was well controlled at 120/82 -Continue hydralazine 25 mg 3 times daily, metoprolol 25 mg twice daily, and irbesartan 300 mg daily  4.  Mitral regurgitation: -MV regurgitation with no evidence of stenosis seen on most recent 2D echo 11/2021 -Continue current antihypertensive regimen  5.  Chronic diastolic CHF: -[place] class II Today patient appears euvolemic on examination denies any complaints of shortness of breath. -Please continue Lasix Monday, Wednesday, Friday -Low sodium diet, fluid restriction <2L, and daily weights encouraged. Educated to contact our office for weight gain of 2 lbs overnight or 5 lbs in one week.        Disposition: Follow-up with [name], MD or APP in 6 months    Medication Adjustments/Labs and Tests Ordered: Current medicines are reviewed at length with the patient today.  Concerns regarding medicines are outlined above.   Signed, Mable Fill, Marissa Nestle, NP 05/13/2022, 2:15 PM [facility]  Note:  This document was prepared using Dragon voice recognition software and may include unintentional dictation errors.

## 2022-05-13 ENCOUNTER — Encounter: Payer: Self-pay | Admitting: Nurse Practitioner

## 2022-05-13 ENCOUNTER — Ambulatory Visit: Payer: Medicare Other | Attending: Nurse Practitioner | Admitting: Nurse Practitioner

## 2022-05-13 VITALS — BP 120/82 | HR 58 | Ht 60.0 in | Wt 142.0 lb

## 2022-05-13 DIAGNOSIS — I480 Paroxysmal atrial fibrillation: Secondary | ICD-10-CM

## 2022-05-13 DIAGNOSIS — I340 Nonrheumatic mitral (valve) insufficiency: Secondary | ICD-10-CM | POA: Diagnosis not present

## 2022-05-13 DIAGNOSIS — I5032 Chronic diastolic (congestive) heart failure: Secondary | ICD-10-CM

## 2022-05-13 DIAGNOSIS — I10 Essential (primary) hypertension: Secondary | ICD-10-CM | POA: Diagnosis not present

## 2022-05-13 DIAGNOSIS — I422 Other hypertrophic cardiomyopathy: Secondary | ICD-10-CM

## 2022-05-13 MED ORDER — MECLIZINE HCL 25 MG PO TABS: 25.0000 mg | ORAL_TABLET | Freq: Every day | ORAL | 3 refills | Status: DC | PRN

## 2022-05-13 NOTE — Patient Instructions (Signed)
Medication Instructions:  Your physician recommends that you continue on your current medications as directed. Please refer to the Current Medication list given to you today.  *If you need a refill on your cardiac medications before your next appointment, please call your pharmacy*   Follow-Up: At [facility], you and your health needs are our priority.  As part of our continuing mission to provide you with exceptional heart care, we have created designated Provider Care Teams.  These Care Teams include your primary Cardiologist (physician) and Advanced Practice Providers (APPs -  Physician Assistants and Nurse Practitioners) who all work together to provide you with the care you need, when you need it.   Your next appointment:   6 month(s)  The format for your next appointment:   In Person  Provider:   [name], MD    Important Information About Sugar

## 2022-07-08 DIAGNOSIS — J208 Acute bronchitis due to other specified organisms: Secondary | ICD-10-CM | POA: Diagnosis not present

## 2022-08-02 ENCOUNTER — Other Ambulatory Visit: Payer: Self-pay | Admitting: Cardiovascular Disease

## 2022-08-16 DIAGNOSIS — I5032 Chronic diastolic (congestive) heart failure: Secondary | ICD-10-CM | POA: Diagnosis not present

## 2022-08-16 DIAGNOSIS — I5030 Unspecified diastolic (congestive) heart failure: Secondary | ICD-10-CM | POA: Diagnosis not present

## 2022-08-16 DIAGNOSIS — I340 Nonrheumatic mitral (valve) insufficiency: Secondary | ICD-10-CM | POA: Diagnosis not present

## 2022-08-16 DIAGNOSIS — Z79899 Other long term (current) drug therapy: Secondary | ICD-10-CM | POA: Diagnosis not present

## 2022-08-16 DIAGNOSIS — I421 Obstructive hypertrophic cardiomyopathy: Secondary | ICD-10-CM | POA: Diagnosis not present

## 2022-08-16 DIAGNOSIS — I482 Chronic atrial fibrillation, unspecified: Secondary | ICD-10-CM | POA: Diagnosis not present

## 2022-08-16 DIAGNOSIS — M069 Rheumatoid arthritis, unspecified: Secondary | ICD-10-CM | POA: Diagnosis not present

## 2022-08-24 DIAGNOSIS — M25512 Pain in left shoulder: Secondary | ICD-10-CM | POA: Diagnosis not present

## 2022-08-24 DIAGNOSIS — M0609 Rheumatoid arthritis without rheumatoid factor, multiple sites: Secondary | ICD-10-CM | POA: Diagnosis not present

## 2022-08-24 DIAGNOSIS — M5136 Other intervertebral disc degeneration, lumbar region: Secondary | ICD-10-CM | POA: Diagnosis not present

## 2022-08-24 DIAGNOSIS — R21 Rash and other nonspecific skin eruption: Secondary | ICD-10-CM | POA: Diagnosis not present

## 2022-08-24 DIAGNOSIS — G629 Polyneuropathy, unspecified: Secondary | ICD-10-CM | POA: Diagnosis not present

## 2022-08-24 DIAGNOSIS — M810 Age-related osteoporosis without current pathological fracture: Secondary | ICD-10-CM | POA: Diagnosis not present

## 2022-08-24 DIAGNOSIS — M1991 Primary osteoarthritis, unspecified site: Secondary | ICD-10-CM | POA: Diagnosis not present

## 2022-08-26 ENCOUNTER — Other Ambulatory Visit: Payer: Self-pay | Admitting: Cardiovascular Disease

## 2022-08-26 DIAGNOSIS — I480 Paroxysmal atrial fibrillation: Secondary | ICD-10-CM

## 2022-08-26 NOTE — Telephone Encounter (Signed)
Prescription refill request for Eliquis received. Indication: Afib  Last office visit: 05/13/22 Barbarann Ehlers)  Scr: 0.71 (09/08/21)  Age: 89 Weight: 64.4kg  Appropriate dose. Refill sent.

## 2022-09-18 ENCOUNTER — Emergency Department (HOSPITAL_COMMUNITY)
Admission: EM | Admit: 2022-09-18 | Discharge: 2022-09-18 | Disposition: A | Discharge status: Home / Self Care | Payer: Medicare Other | Attending: Emergency Medicine | Admitting: Emergency Medicine

## 2022-09-18 ENCOUNTER — Other Ambulatory Visit: Payer: Self-pay

## 2022-09-18 ENCOUNTER — Emergency Department (HOSPITAL_COMMUNITY): Payer: Medicare Other

## 2022-09-18 DIAGNOSIS — Z96649 Presence of unspecified artificial hip joint: Secondary | ICD-10-CM | POA: Diagnosis not present

## 2022-09-18 DIAGNOSIS — Z7901 Long term (current) use of anticoagulants: Secondary | ICD-10-CM | POA: Diagnosis not present

## 2022-09-18 DIAGNOSIS — W01190A Fall on same level from slipping, tripping and stumbling with subsequent striking against furniture, initial encounter: Secondary | ICD-10-CM | POA: Diagnosis not present

## 2022-09-18 DIAGNOSIS — R079 Chest pain, unspecified: Secondary | ICD-10-CM | POA: Diagnosis not present

## 2022-09-18 DIAGNOSIS — M546 Pain in thoracic spine: Secondary | ICD-10-CM | POA: Diagnosis not present

## 2022-09-18 DIAGNOSIS — Z743 Need for continuous supervision: Secondary | ICD-10-CM | POA: Diagnosis not present

## 2022-09-18 DIAGNOSIS — M545 Low back pain, unspecified: Secondary | ICD-10-CM | POA: Diagnosis not present

## 2022-09-18 DIAGNOSIS — Z043 Encounter for examination and observation following other accident: Secondary | ICD-10-CM | POA: Diagnosis not present

## 2022-09-18 DIAGNOSIS — S0003XA Contusion of scalp, initial encounter: Secondary | ICD-10-CM | POA: Insufficient documentation

## 2022-09-18 DIAGNOSIS — W19XXXA Unspecified fall, initial encounter: Secondary | ICD-10-CM

## 2022-09-18 DIAGNOSIS — M419 Scoliosis, unspecified: Secondary | ICD-10-CM | POA: Diagnosis not present

## 2022-09-18 DIAGNOSIS — I10 Essential (primary) hypertension: Secondary | ICD-10-CM | POA: Diagnosis not present

## 2022-09-18 DIAGNOSIS — S0990XA Unspecified injury of head, initial encounter: Secondary | ICD-10-CM | POA: Diagnosis not present

## 2022-09-18 MED ORDER — CYCLOBENZAPRINE HCL 10 MG PO TABS
5.0000 mg | ORAL_TABLET | Freq: Once | ORAL | Status: AC
  Administered 2022-09-18: 5 mg via ORAL
  Filled 2022-09-18: qty 1

## 2022-09-18 MED ORDER — ACETAMINOPHEN 325 MG PO TABS
650.0000 mg | ORAL_TABLET | Freq: Once | ORAL | Status: AC
  Administered 2022-09-18: 650 mg via ORAL
  Filled 2022-09-18: qty 2

## 2022-09-18 NOTE — Progress Notes (Signed)
Orthopedic Tech Progress Note Patient Details:  Stacy [name] [birth year] [id number]  Patient ID: Stacy [name], female   DOB: Sep 24, 1928, 89 y.o.   MRN: [id number] I attended trauma page. Karolee Stamps 09/18/2022, 11:26 PM

## 2022-09-18 NOTE — ED Triage Notes (Addendum)
Pt arrived via [facility] from home for level 2 fall on thinners (eliquis). Pt suffered mechanical fall onto carpet, striking head on table. Pt reports pain in hip/back on right side and posterior head, hematoma noted. Pt crawled to door and banged on it to notify son and EMS was called. No Loc. No ccollar. No PIV. A&Ox4, GCS 15. Pt denies dizziness, sob, nausea, chest pain.   PTA EMS Vitals BP 138/82 HR 63  SPO2 95% RA

## 2022-09-18 NOTE — ED Provider Notes (Signed)
[name]   CSN: [id number] Arrival date & time: 09/18/22  1958     History  Chief Complaint  Patient presents with   Stacy [name] is a 89 y.o. female.  Patient here after mechanical fall.  She is on Eliquis.  She tripped getting out of a closet hit the right side of her head on a table.  Still having some low back pain.  Has a hematoma over the right side of her head per EMS.  Had to crawl to get out of the room.  She is having a lot of back pain but feeling better now.  Denies any numbness or weakness.  The history is provided by the patient.       Home Medications Prior to Admission medications   Medication Sig Start Date End Date Taking? Authorizing Provider  acetaminophen (TYLENOL) 325 MG tablet Take 650 mg by mouth every 6 (six) hours as needed for moderate pain or headache.    [provider]  amiodarone (PACERONE) 200 MG tablet Take one tablet by mouth on Monday and Thursday 03/06/21   Nahser, Wonda Cheng, MD  Ascorbic Acid (VITAMIN C) 1000 MG tablet Take 2,000 mg by mouth daily.    [provider]  BIOTIN PO Take 1 capsule by mouth daily.    [provider]  Cholecalciferol (VITAMIN D-3 PO) Take 1 tablet by mouth daily.    [provider]  ELIQUIS 5 MG TABS tablet TAKE ONE TABLET BY MOUTH TWICE DAILY 08/26/22   Nahser, Wonda Cheng, MD  furosemide (LASIX) 20 MG tablet Take 1 tablet (20 mg total) by mouth every Monday, Wednesday, and Friday. 07/23/20   Nahser, Wonda Cheng, MD  hydrALAZINE (APRESOLINE) 25 MG tablet Take 1 tablet 3 times daily if BP >140 07/22/20   Nahser, Wonda Cheng, MD  irbesartan (AVAPRO) 300 MG tablet Take 1 tablet (300 mg total) by mouth daily. 11/18/21   Nahser, Wonda Cheng, MD  meclizine (ANTIVERT) 25 MG tablet Take 1 tablet (25 mg total) by mouth daily as needed for dizziness. 05/13/22   Marylu Lund., NP  methotrexate 2.5 MG tablet Take 15 mg by mouth once a  week. 10/01/21   [provider]  metoprolol tartrate (LOPRESSOR) 25 MG tablet Take 1 tablet (25 mg total) by mouth 2 (two) times daily. 08/02/22   Nahser, Wonda Cheng, MD  Polyvinyl Alcohol-Povidone (REFRESH OP) Place 1 drop into both eyes as needed (dry eyes).    [provider]  potassium chloride (KLOR-CON) 10 MEQ tablet Take 1 tablet (10 mEq total) by mouth every Monday, Wednesday, and Friday. 07/23/20   Nahser, Wonda Cheng, MD  PROAIR RESPICLICK [id number] [phone] Base) MCG/ACT AEPB Inhale 2 puffs into the lungs every 6 (six) hours as needed (wheezing, shortnes of breath). 04/22/17   [provider]  rOPINIRole (REQUIP) 1 MG tablet Take 1 mg by mouth at bedtime.  03/27/15   [provider]  zinc gluconate 50 MG tablet Take 50 mg by mouth daily.    [provider]      Allergies    Sulfa antibiotics    Review of Systems   Review of Systems  Physical Exam Updated Vital Signs BP (!) 161/95   Pulse 80   Temp 97.9 F (36.6 C) (Oral)   Resp 18   Ht 5' (1.524 m)   Wt 62.6 kg   LMP  (LMP  Unknown)   SpO2 98%   BMI 26.95 kg/m  Physical Exam Vitals and nursing note reviewed.  Constitutional:      General: She is not in acute distress.    Appearance: She is well-developed. She is not ill-appearing.  HENT:     Head:     Comments: Right-sided parietal scalp hematoma    Mouth/Throat:     Mouth: Mucous membranes are moist.  Eyes:     Extraocular Movements: Extraocular movements intact.     Conjunctiva/sclera: Conjunctivae normal.     Pupils: Pupils are equal, round, and reactive to light.  Cardiovascular:     Rate and Rhythm: Normal rate and regular rhythm.     Pulses: Normal pulses.     Heart sounds: Normal heart sounds. No murmur heard. Pulmonary:     Effort: Pulmonary effort is normal. No respiratory distress.     Breath sounds: Normal breath sounds.  Abdominal:     Palpations: Abdomen is soft.     Tenderness: There is no abdominal tenderness.   Musculoskeletal:        General: Tenderness present. Normal range of motion.     Cervical back: Neck supple.     Comments: Tenderness to the paraspinal lumbar muscles on the right  Skin:    General: Skin is warm and dry.     Capillary Refill: Capillary refill takes less than 2 seconds.  Neurological:     General: No focal deficit present.     Mental Status: She is alert and oriented to person, place, and time.     Cranial Nerves: No cranial nerve deficit.     Sensory: No sensory deficit.     Motor: No weakness.     Coordination: Coordination normal.     ED Results / Procedures / Treatments   Labs (all labs ordered are listed, but only abnormal results are displayed) Labs Reviewed - No data to display  EKG EKG Interpretation  Date/Time:  Saturday September 18 2022 20:07:45 EST Ventricular Rate:  74 PR Interval:    QRS Duration: 111 QT Interval:  429 QTC Calculation: 476 R Axis:   26 Text Interpretation: Atrial fibrillation Incomplete left bundle branch block Confirmed by Lennice Sites ([id number]) on 09/18/2022 8:59:37 PM  Radiology DG Chest 2 View  Result Date: 09/18/2022 CLINICAL DATA:  Fall.  Pain in lower right side. EXAM: CHEST - 2 VIEW COMPARISON:  09/08/2021. FINDINGS: Heart is enlarged and the mediastinal contour is within normal limits. There is atherosclerotic calcification of the aorta. No consolidation, effusion, or pneumothorax. Degenerative changes are present in the thoracic spine. No acute osseous abnormality. IMPRESSION: Cardiomegaly with no acute abnormality. Electronically Signed   By: Brett Fairy M.D.   On: 09/18/2022 20:56   DG Pelvis Portable  Result Date: 09/18/2022 CLINICAL DATA:  Fall with back and hip pain. EXAM: LUMBAR SPINE - COMPLETE 4+ VIEW; PORTABLE PELVIS 1-2 VIEWS COMPARISON:  01/13/2022. FINDINGS: Lumbar spine: There is no evidence of acute lumbar spine fracture. Mild levoscoliosis is noted. Alignment is normal. Multilevel intervertebral disc space  narrowing and degenerative endplate changes are present. Facet arthropathy is noted throughout the lumbar spine. Vascular calcifications are noted in the pelvis. Pelvis: Total hip arthroplasty changes are noted on the left without evidence of hardware loosening. No acute fracture or dislocation. Degenerative changes are noted in the lower lumbar spine. Vascular calcifications are noted in the pelvis. IMPRESSION: 1. No acute fracture in the lumbar spine or pelvis. 2. Total hip arthroplasty changes  on the left without evidence of hardware loosening. 3. Multilevel degenerative disc disease and facet arthropathy in the lumbar spine. Electronically Signed   By: Brett Fairy M.D.   On: 09/18/2022 20:54   DG Lumbar Spine Complete  Result Date: 09/18/2022 CLINICAL DATA:  Fall with back and hip pain. EXAM: LUMBAR SPINE - COMPLETE 4+ VIEW; PORTABLE PELVIS 1-2 VIEWS COMPARISON:  01/13/2022. FINDINGS: Lumbar spine: There is no evidence of acute lumbar spine fracture. Mild levoscoliosis is noted. Alignment is normal. Multilevel intervertebral disc space narrowing and degenerative endplate changes are present. Facet arthropathy is noted throughout the lumbar spine. Vascular calcifications are noted in the pelvis. Pelvis: Total hip arthroplasty changes are noted on the left without evidence of hardware loosening. No acute fracture or dislocation. Degenerative changes are noted in the lower lumbar spine. Vascular calcifications are noted in the pelvis. IMPRESSION: 1. No acute fracture in the lumbar spine or pelvis. 2. Total hip arthroplasty changes on the left without evidence of hardware loosening. 3. Multilevel degenerative disc disease and facet arthropathy in the lumbar spine. Electronically Signed   By: Brett Fairy M.D.   On: 09/18/2022 20:54   CT Head Wo Contrast  Result Date: 09/18/2022 CLINICAL DATA:  Trauma/fall EXAM: CT HEAD WITHOUT CONTRAST CT CERVICAL SPINE WITHOUT CONTRAST TECHNIQUE: Multidetector CT imaging  of the head and cervical spine was performed following the standard protocol without intravenous contrast. Multiplanar CT image reconstructions of the cervical spine were also generated. RADIATION DOSE REDUCTION: This exam was performed according to the departmental dose-optimization program which includes automated exposure control, adjustment of the mA and/or kV according to patient size and/or use of iterative reconstruction technique. COMPARISON:  CT head dated 02/26/2020 FINDINGS: CT HEAD FINDINGS Brain: No evidence of acute infarction, hemorrhage, hydrocephalus, extra-axial collection or mass lesion/mass effect. Vascular: Mild intracranial atherosclerosis. Skull: Normal. Negative for fracture or focal lesion. Sinuses/Orbits: The visualized paranasal sinuses are essentially clear. The mastoid air cells are unopacified. Other: None. CT CERVICAL SPINE FINDINGS Alignment: Normal cervical lordosis. Skull base and vertebrae: No acute fracture. No primary bone lesion or focal pathologic process. Soft tissues and spinal canal: No prevertebral fluid or swelling. No visible canal hematoma. Disc levels: Mild degenerative changes of the mid/lower cervical spine. Spinal canal is patent. Upper chest: Visualized lung apices are clear. Other: Visualized thyroid is unremarkable. IMPRESSION: No evidence of acute intracranial abnormality. No traumatic injury to the cervical spine. Mild degenerative changes. Electronically Signed   By: Julian Hy M.D.   On: 09/18/2022 20:36   CT Cervical Spine Wo Contrast  Result Date: 09/18/2022 CLINICAL DATA:  Trauma/fall EXAM: CT HEAD WITHOUT CONTRAST CT CERVICAL SPINE WITHOUT CONTRAST TECHNIQUE: Multidetector CT imaging of the head and cervical spine was performed following the standard protocol without intravenous contrast. Multiplanar CT image reconstructions of the cervical spine were also generated. RADIATION DOSE REDUCTION: This exam was performed according to the departmental  dose-optimization program which includes automated exposure control, adjustment of the mA and/or kV according to patient size and/or use of iterative reconstruction technique. COMPARISON:  CT head dated 02/26/2020 FINDINGS: CT HEAD FINDINGS Brain: No evidence of acute infarction, hemorrhage, hydrocephalus, extra-axial collection or mass lesion/mass effect. Vascular: Mild intracranial atherosclerosis. Skull: Normal. Negative for fracture or focal lesion. Sinuses/Orbits: The visualized paranasal sinuses are essentially clear. The mastoid air cells are unopacified. Other: None. CT CERVICAL SPINE FINDINGS Alignment: Normal cervical lordosis. Skull base and vertebrae: No acute fracture. No primary bone lesion or focal pathologic process. Soft tissues  and spinal canal: No prevertebral fluid or swelling. No visible canal hematoma. Disc levels: Mild degenerative changes of the mid/lower cervical spine. Spinal canal is patent. Upper chest: Visualized lung apices are clear. Other: Visualized thyroid is unremarkable. IMPRESSION: No evidence of acute intracranial abnormality. No traumatic injury to the cervical spine. Mild degenerative changes. Electronically Signed   By: Julian Hy M.D.   On: 09/18/2022 20:36    Procedures Procedures    Medications Ordered in ED Medications  acetaminophen (TYLENOL) tablet 650 mg (650 mg Oral Given 09/18/22 2129)  cyclobenzaprine (FLEXERIL) tablet 5 mg (5 mg Oral Given 09/18/22 2129)    ED Course/ Medical Decision Making/ A&P                             Medical Decision Making Amount and/or Complexity of Data Reviewed Radiology: ordered.  Risk OTC drugs. Prescription drug management.   Stacy [name] is here after mechanical fall at home.  She is on Eliquis.  She has a hematoma to the right side of her scalp.  She is having some low back pain.  Normal vitals.  No fever.  CT scan of her head and neck were obtained and per my review interpretation no acute findings.   Chest x-ray, pelvic x-ray, low back x-ray without any acute findings per my review and interpretation.  Radiology report also with no acute findings.  Overall suspect contusions.  Recommend Tylenol and ice and rest and lidocaine patches at home.  Given a dose of Tylenol and Flexeril here in the ED.  She understands return precautions.  She is neurovascular neuromuscular intact.  Discharged in good condition.  This chart was dictated using voice recognition software.  Despite best efforts to proofread,  errors can occur which can change the documentation meaning.         Final Clinical Impression(s) / ED Diagnoses Final diagnoses:  Fall, initial encounter    Rx / DC Orders ED Discharge Orders     None         Lennice Sites, DO 09/18/22 2152

## 2022-09-18 NOTE — ED Notes (Signed)
Pt transported to CT with TRN [place]

## 2022-09-24 ENCOUNTER — Telehealth: Payer: Self-pay | Admitting: *Deleted

## 2022-09-24 NOTE — Telephone Encounter (Signed)
        Patient  visited [facility] on 09/17/2022  for treatment   Telephone encounter attempt :  1st  A HIPAA compliant voice message was left requesting a return call.  Instructed patient to call back at [phone]. [name] [place], [facility] [phone] [address] , [place] [address] Email : [name]      

## 2022-09-26 ENCOUNTER — Emergency Department (HOSPITAL_COMMUNITY): Payer: Medicare Other

## 2022-09-26 ENCOUNTER — Emergency Department (HOSPITAL_COMMUNITY)
Admission: EM | Admit: 2022-09-26 | Discharge: 2022-09-26 | Disposition: A | Discharge status: Home / Self Care | Payer: Medicare Other | Attending: Emergency Medicine | Admitting: Emergency Medicine

## 2022-09-26 DIAGNOSIS — Z79899 Other long term (current) drug therapy: Secondary | ICD-10-CM | POA: Insufficient documentation

## 2022-09-26 DIAGNOSIS — I110 Hypertensive heart disease with heart failure: Secondary | ICD-10-CM | POA: Insufficient documentation

## 2022-09-26 DIAGNOSIS — Z9049 Acquired absence of other specified parts of digestive tract: Secondary | ICD-10-CM | POA: Diagnosis not present

## 2022-09-26 DIAGNOSIS — I509 Heart failure, unspecified: Secondary | ICD-10-CM | POA: Diagnosis not present

## 2022-09-26 DIAGNOSIS — Z7901 Long term (current) use of anticoagulants: Secondary | ICD-10-CM | POA: Insufficient documentation

## 2022-09-26 DIAGNOSIS — K5730 Diverticulosis of large intestine without perforation or abscess without bleeding: Secondary | ICD-10-CM | POA: Diagnosis not present

## 2022-09-26 DIAGNOSIS — K5900 Constipation, unspecified: Secondary | ICD-10-CM

## 2022-09-26 DIAGNOSIS — N3289 Other specified disorders of bladder: Secondary | ICD-10-CM | POA: Diagnosis not present

## 2022-09-26 DIAGNOSIS — R1030 Lower abdominal pain, unspecified: Secondary | ICD-10-CM

## 2022-09-26 DIAGNOSIS — R1031 Right lower quadrant pain: Secondary | ICD-10-CM | POA: Diagnosis not present

## 2022-09-26 LAB — COMPREHENSIVE METABOLIC PANEL
ALT: 25 U/L (ref 0–44)
AST: 30 U/L (ref 15–41)
Albumin: 3.5 g/dL (ref 3.5–5.0)
Alkaline Phosphatase: 61 U/L (ref 38–126)
Anion gap: 9 (ref 5–15)
BUN: 19 mg/dL (ref 8–23)
CO2: 27 mmol/L (ref 22–32)
Calcium: 9.5 mg/dL (ref 8.9–10.3)
Chloride: 102 mmol/L (ref 98–111)
Creatinine, Ser: 1.12 mg/dL — ABNORMAL HIGH (ref 0.44–1.00)
GFR, Estimated: 46 mL/min — ABNORMAL LOW (ref >60)
Glucose, Bld: 100 mg/dL — ABNORMAL HIGH (ref 70–99)
Potassium: 3.9 mmol/L (ref 3.5–5.1)
Sodium: 138 mmol/L (ref 135–145)
Total Bilirubin: 1.1 mg/dL (ref 0.3–1.2)
Total Protein: 6.2 g/dL — ABNORMAL LOW (ref 6.5–8.1)

## 2022-09-26 LAB — CBC
HCT: 44.2 % (ref 36.0–46.0)
Hemoglobin: 14.6 g/dL (ref 12.0–15.0)
MCH: 33.1 pg (ref 26.0–34.0)
MCHC: 33 g/dL (ref 30.0–36.0)
MCV: 100.2 fL — ABNORMAL HIGH (ref 80.0–100.0)
Platelets: 194 10*3/uL (ref 150–400)
RBC: 4.41 MIL/uL (ref 3.87–5.11)
RDW: 14.2 % (ref 11.5–15.5)
WBC: 10.1 10*3/uL (ref 4.0–10.5)
nRBC: 0 % (ref 0.0–0.2)

## 2022-09-26 LAB — URINALYSIS, ROUTINE W REFLEX MICROSCOPIC
Bilirubin Urine: NEGATIVE
Glucose, UA: NEGATIVE mg/dL
Hgb urine dipstick: NEGATIVE
Ketones, ur: NEGATIVE mg/dL
Leukocytes,Ua: NEGATIVE
Nitrite: NEGATIVE
Protein, ur: NEGATIVE mg/dL
Specific Gravity, Urine: 1.005 (ref 1.005–1.030)
pH: 6 (ref 5.0–8.0)

## 2022-09-26 LAB — LIPASE, BLOOD: Lipase: 39 U/L (ref 11–51)

## 2022-09-26 MED ORDER — POLYETHYLENE GLYCOL 3350 17 G PO PACK: 17.0000 g | PACK | Freq: Every day | ORAL | 0 refills | Status: DC

## 2022-09-26 MED ORDER — SODIUM CHLORIDE 0.9 % IV BOLUS
1000.0000 mL | Freq: Once | INTRAVENOUS | Status: AC
  Administered 2022-09-26: 1000 mL via INTRAVENOUS

## 2022-09-26 MED ORDER — IOHEXOL 350 MG/ML SOLN
75.0000 mL | Freq: Once | INTRAVENOUS | Status: AC | PRN
  Administered 2022-09-26: 75 mL via INTRAVENOUS

## 2022-09-26 MED ORDER — ONDANSETRON 8 MG PO TBDP: 8.0000 mg | ORAL_TABLET | Freq: Three times a day (TID) | ORAL | 0 refills | Status: DC | PRN

## 2022-09-26 NOTE — Discharge Instructions (Signed)
CT scan does not show any evidence of small bowel obstruction.  We do not see significant constipation either.  Take the medications that are prescribed for normalization of your bowel movement.  Follow-up with your primary care doctor for further assessment.

## 2022-09-26 NOTE — ED Triage Notes (Signed)
Pt to ED c/o constipation and abdominal pain x 1 week, reports last BM 1 week ago.

## 2022-09-26 NOTE — ED Notes (Signed)
Patient ambulated to restroom independently and provided urine specimen.

## 2022-09-26 NOTE — ED Provider Notes (Signed)
[facility] Provider Note   CSN: [id number] Arrival date & time: 09/26/22  1837     History {Add pertinent medical, surgical, social history, OB history to HPI:1} Chief Complaint  Patient presents with   Constipation   Abdominal Pain    Stacy [name] is a 89 y.o. female.  HPI     89 year old female comes in with chief complaint of abdominal pain, nausea and constipation.  Patient has history of small bowel obstruction several years ago and she is status post cholecystectomy.  Patient also has history of heart failure, hocm, mitral regurg and hypertension.  According to the patient, her last bowel meant was 1 week ago.  She started having some nausea and abdominal discomfort today prompting her to come to the ER.  Patient has been feeling bloated and has reduced p.o. intake.  She denies any emesis.  She does not have any history of constipation.  She denies bloody stool.  No UTI-like symptoms.  Home Medications Prior to Admission medications   Medication Sig Start Date End Date Taking? Authorizing Provider  acetaminophen (TYLENOL) 325 MG tablet Take 650 mg by mouth every 6 (six) hours as needed for moderate pain or headache.    [provider]  amiodarone (PACERONE) 200 MG tablet Take one tablet by mouth on Monday and Thursday 03/06/21   Nahser, Wonda Cheng, MD  Ascorbic Acid (VITAMIN C) 1000 MG tablet Take 2,000 mg by mouth daily.    [provider]  BIOTIN PO Take 1 capsule by mouth daily.    [provider]  Cholecalciferol (VITAMIN D-3 PO) Take 1 tablet by mouth daily.    [provider]  ELIQUIS 5 MG TABS tablet TAKE ONE TABLET BY MOUTH TWICE DAILY 08/26/22   Nahser, Wonda Cheng, MD  furosemide (LASIX) 20 MG tablet Take 1 tablet (20 mg total) by mouth every Monday, Wednesday, and Friday. 07/23/20   Nahser, Wonda Cheng, MD  hydrALAZINE (APRESOLINE) 25 MG tablet Take 1 tablet 3 times daily if BP >140 07/22/20    Nahser, Wonda Cheng, MD  irbesartan (AVAPRO) 300 MG tablet Take 1 tablet (300 mg total) by mouth daily. 11/18/21   Nahser, Wonda Cheng, MD  meclizine (ANTIVERT) 25 MG tablet Take 1 tablet (25 mg total) by mouth daily as needed for dizziness. 05/13/22   Marylu Lund., NP  methotrexate 2.5 MG tablet Take 15 mg by mouth once a week. 10/01/21   [provider]  metoprolol tartrate (LOPRESSOR) 25 MG tablet Take 1 tablet (25 mg total) by mouth 2 (two) times daily. 08/02/22   Nahser, Wonda Cheng, MD  Polyvinyl Alcohol-Povidone (REFRESH OP) Place 1 drop into both eyes as needed (dry eyes).    [provider]  potassium chloride (KLOR-CON) 10 MEQ tablet Take 1 tablet (10 mEq total) by mouth every Monday, Wednesday, and Friday. 07/23/20   Nahser, Wonda Cheng, MD  PROAIR RESPICLICK [id number] [phone] Base) MCG/ACT AEPB Inhale 2 puffs into the lungs every 6 (six) hours as needed (wheezing, shortnes of breath). 04/22/17   [provider]  rOPINIRole (REQUIP) 1 MG tablet Take 1 mg by mouth at bedtime.  03/27/15   [provider]  zinc gluconate 50 MG tablet Take 50 mg by mouth daily.    [provider]      Allergies    Sulfa antibiotics    Review of Systems   Review of Systems  All other systems reviewed and  are negative.   Physical Exam Updated Vital Signs BP (!) 152/97   Pulse 77   Temp 97.8 F (36.6 C) (Oral)   Resp 18   Ht 5' (1.524 m)   Wt 62.6 kg   LMP  (LMP Unknown)   SpO2 94%   BMI 26.95 kg/m  Physical Exam Vitals and nursing note reviewed.  Constitutional:      Appearance: She is well-developed.  HENT:     Head: Atraumatic.  Cardiovascular:     Rate and Rhythm: Normal rate.  Pulmonary:     Effort: Pulmonary effort is normal.  Abdominal:     Tenderness: There is abdominal tenderness in the right upper quadrant and right lower quadrant. There is no guarding or rebound.  Musculoskeletal:     Cervical back: Normal range of motion and neck supple.  Skin:     General: Skin is warm and dry.  Neurological:     Mental Status: She is alert and oriented to person, place, and time.     ED Results / Procedures / Treatments   Labs (all labs ordered are listed, but only abnormal results are displayed) Labs Reviewed  COMPREHENSIVE METABOLIC PANEL - Abnormal; Notable for the following components:      Result Value   Glucose, Bld 100 (*)    Creatinine, Ser 1.12 (*)    Total Protein 6.2 (*)    GFR, Estimated 46 (*)    All other components within normal limits  CBC - Abnormal; Notable for the following components:   MCV 100.2 (*)    All other components within normal limits  LIPASE, BLOOD  URINALYSIS, ROUTINE W REFLEX MICROSCOPIC    EKG None  Radiology No results found.  Procedures Procedures  {Document cardiac monitor, telemetry assessment procedure when appropriate:1}  Medications Ordered in ED Medications  sodium chloride 0.9 % bolus 1,000 mL (1,000 mLs Intravenous New Bag/Given 09/26/22 2005)    ED Course/ Medical Decision Making/ A&P   {   Click here for ABCD2, HEART and other calculatorsREFRESH Note before signing :1}                          Medical Decision Making Amount and/or Complexity of Data Reviewed Labs: ordered. Radiology: ordered.   This patient presents to the ED with chief complaint(s) of abdominal discomfort, constipation, nausea with pertinent past medical history of cholecystectomy, questionable appendectomy and previous history of SBO.The complaint involves an extensive differential diagnosis and also carries with it a high risk of complications and morbidity.    The differential diagnosis includes ileus, UTI, small bowel obstruction, constipation, volvulus, tumor, diverticulitis  The initial plan is to get basic labs and CT abdomen and pelvis with contrast.   Additional history obtained: Additional history obtained from family, who is at the bedside.  They confirmed that patient typically does not have  constipation. Records reviewed  previous workup has been reviewed. No recent CT scans of the abd  Independent labs interpretation:  The following labs were independently interpreted: CBC and CMP are reassuring.  Independent visualization and interpretation of imaging: - I independently visualized the following imaging with scope of interpretation limited to determining acute life threatening conditions related to emergency care: ***, which revealed ***  Treatment and Reassessment: ***  Consultation: - Consulted or discussed management/test interpretation with external professional: ***  Consideration for admission or further workup:  Social Determinants of health:   Final Clinical Impression(s) / ED Diagnoses  Final diagnoses:  None    Rx / DC Orders ED Discharge Orders     None

## 2022-10-04 ENCOUNTER — Telehealth: Payer: Self-pay

## 2022-10-04 NOTE — Telephone Encounter (Signed)
     Patient  visit on 3/10  at [facility]   Have you been able to follow up with your primary care physician? Yes   The patient was or was not able to obtain any needed medicine or equipment. Yes   Are there diet recommendations that you are having difficulty following? Na   Patient expresses understanding of discharge instructions and education provided has no other needs at this time.  Yes        [facility] [phone] [address], [place], [place] [address] Phone: [phone] Email: [email or web address]

## 2022-10-28 DIAGNOSIS — B351 Tinea unguium: Secondary | ICD-10-CM | POA: Diagnosis not present

## 2022-11-03 DIAGNOSIS — J301 Allergic rhinitis due to pollen: Secondary | ICD-10-CM | POA: Diagnosis not present

## 2022-11-23 DIAGNOSIS — I421 Obstructive hypertrophic cardiomyopathy: Secondary | ICD-10-CM | POA: Diagnosis not present

## 2022-11-23 DIAGNOSIS — I340 Nonrheumatic mitral (valve) insufficiency: Secondary | ICD-10-CM | POA: Diagnosis not present

## 2022-11-23 DIAGNOSIS — M069 Rheumatoid arthritis, unspecified: Secondary | ICD-10-CM | POA: Diagnosis not present

## 2022-11-23 DIAGNOSIS — Z79899 Other long term (current) drug therapy: Secondary | ICD-10-CM | POA: Diagnosis not present

## 2022-11-23 DIAGNOSIS — E785 Hyperlipidemia, unspecified: Secondary | ICD-10-CM | POA: Diagnosis not present

## 2022-11-23 DIAGNOSIS — I5032 Chronic diastolic (congestive) heart failure: Secondary | ICD-10-CM | POA: Diagnosis not present

## 2022-11-23 DIAGNOSIS — I5030 Unspecified diastolic (congestive) heart failure: Secondary | ICD-10-CM | POA: Diagnosis not present

## 2022-11-23 DIAGNOSIS — I482 Chronic atrial fibrillation, unspecified: Secondary | ICD-10-CM | POA: Diagnosis not present

## 2022-12-18 ENCOUNTER — Other Ambulatory Visit: Payer: Self-pay | Admitting: Cardiovascular Disease

## 2022-12-27 DIAGNOSIS — H43813 Vitreous degeneration, bilateral: Secondary | ICD-10-CM | POA: Diagnosis not present

## 2022-12-27 DIAGNOSIS — H524 Presbyopia: Secondary | ICD-10-CM | POA: Diagnosis not present

## 2022-12-27 DIAGNOSIS — H52203 Unspecified astigmatism, bilateral: Secondary | ICD-10-CM | POA: Diagnosis not present

## 2022-12-27 DIAGNOSIS — H26491 Other secondary cataract, right eye: Secondary | ICD-10-CM | POA: Diagnosis not present

## 2022-12-27 DIAGNOSIS — D23111 Other benign neoplasm of skin of right upper eyelid, including canthus: Secondary | ICD-10-CM | POA: Diagnosis not present

## 2022-12-27 DIAGNOSIS — H353122 Nonexudative age-related macular degeneration, left eye, intermediate dry stage: Secondary | ICD-10-CM | POA: Diagnosis not present

## 2022-12-27 DIAGNOSIS — H04123 Dry eye syndrome of bilateral lacrimal glands: Secondary | ICD-10-CM | POA: Diagnosis not present

## 2023-01-06 DIAGNOSIS — D229 Melanocytic nevi, unspecified: Secondary | ICD-10-CM | POA: Diagnosis not present

## 2023-01-06 DIAGNOSIS — R21 Rash and other nonspecific skin eruption: Secondary | ICD-10-CM | POA: Diagnosis not present

## 2023-01-13 ENCOUNTER — Telehealth: Payer: Self-pay | Admitting: Cardiovascular Disease

## 2023-01-13 NOTE — Telephone Encounter (Signed)
Pt c/o medication issue:  1. Name of Medication: ELIQUIS 5 MG TABS tablet   2. How are you currently taking this medication (dosage and times per day)?   3. Are you having a reaction (difficulty breathing--STAT)?   4. What is your medication issue?   Patient states that she has spots come up on her skin and if she hits them, they start bleeding quite a bit. She would like a call back to discuss.

## 2023-01-14 NOTE — Telephone Encounter (Signed)
Attempted to return call to patient, left message asking that she call us back. Called daughter on DPR who was with patient at time of call. Informed patient that the Eliquis will cause easy bruising and bleeding and unfortunately, there wasn't much that could be done for it. Educated on holding pressure, being mindful of using band-aids as they can tear skin. She verbalized understanding.

## 2023-01-31 DIAGNOSIS — M25552 Pain in left hip: Secondary | ICD-10-CM | POA: Diagnosis not present

## 2023-02-22 DIAGNOSIS — M25512 Pain in left shoulder: Secondary | ICD-10-CM | POA: Diagnosis not present

## 2023-02-22 DIAGNOSIS — M1991 Primary osteoarthritis, unspecified site: Secondary | ICD-10-CM | POA: Diagnosis not present

## 2023-02-22 DIAGNOSIS — M5136 Other intervertebral disc degeneration, lumbar region: Secondary | ICD-10-CM | POA: Diagnosis not present

## 2023-02-22 DIAGNOSIS — R21 Rash and other nonspecific skin eruption: Secondary | ICD-10-CM | POA: Diagnosis not present

## 2023-02-22 DIAGNOSIS — M810 Age-related osteoporosis without current pathological fracture: Secondary | ICD-10-CM | POA: Diagnosis not present

## 2023-02-22 DIAGNOSIS — G629 Polyneuropathy, unspecified: Secondary | ICD-10-CM | POA: Diagnosis not present

## 2023-02-22 DIAGNOSIS — M0609 Rheumatoid arthritis without rheumatoid factor, multiple sites: Secondary | ICD-10-CM | POA: Diagnosis not present

## 2023-02-28 DIAGNOSIS — I10 Essential (primary) hypertension: Secondary | ICD-10-CM | POA: Diagnosis not present

## 2023-02-28 DIAGNOSIS — I5032 Chronic diastolic (congestive) heart failure: Secondary | ICD-10-CM | POA: Diagnosis not present

## 2023-02-28 DIAGNOSIS — I421 Obstructive hypertrophic cardiomyopathy: Secondary | ICD-10-CM | POA: Diagnosis not present

## 2023-02-28 DIAGNOSIS — I482 Chronic atrial fibrillation, unspecified: Secondary | ICD-10-CM | POA: Diagnosis not present

## 2023-02-28 DIAGNOSIS — Z79899 Other long term (current) drug therapy: Secondary | ICD-10-CM | POA: Diagnosis not present

## 2023-02-28 DIAGNOSIS — M069 Rheumatoid arthritis, unspecified: Secondary | ICD-10-CM | POA: Diagnosis not present

## 2023-02-28 DIAGNOSIS — Z139 Encounter for screening, unspecified: Secondary | ICD-10-CM | POA: Diagnosis not present

## 2023-02-28 DIAGNOSIS — E785 Hyperlipidemia, unspecified: Secondary | ICD-10-CM | POA: Diagnosis not present

## 2023-02-28 DIAGNOSIS — I5030 Unspecified diastolic (congestive) heart failure: Secondary | ICD-10-CM | POA: Diagnosis not present

## 2023-02-28 DIAGNOSIS — Z9181 History of falling: Secondary | ICD-10-CM | POA: Diagnosis not present

## 2023-02-28 DIAGNOSIS — I340 Nonrheumatic mitral (valve) insufficiency: Secondary | ICD-10-CM | POA: Diagnosis not present

## 2023-03-24 DIAGNOSIS — D692 Other nonthrombocytopenic purpura: Secondary | ICD-10-CM | POA: Diagnosis not present

## 2023-03-24 DIAGNOSIS — D2239 Melanocytic nevi of other parts of face: Secondary | ICD-10-CM | POA: Diagnosis not present

## 2023-03-24 DIAGNOSIS — L821 Other seborrheic keratosis: Secondary | ICD-10-CM | POA: Diagnosis not present

## 2023-04-02 ENCOUNTER — Other Ambulatory Visit: Payer: Self-pay | Admitting: Cardiovascular Disease

## 2023-04-02 DIAGNOSIS — I480 Paroxysmal atrial fibrillation: Secondary | ICD-10-CM

## 2023-04-04 NOTE — Telephone Encounter (Signed)
Prescription refill request for Eliquis received. Indication:afib Last office visit:10/23 Scr:1.12  3/24 Age: 89 Weight:62.6  kg  Prescription refilled

## 2023-04-07 ENCOUNTER — Other Ambulatory Visit: Payer: Self-pay

## 2023-04-07 MED ORDER — IRBESARTAN 300 MG PO TABS: 300.0000 mg | ORAL_TABLET | Freq: Every day | ORAL | 1 refills | Status: DC

## 2023-04-22 ENCOUNTER — Other Ambulatory Visit: Payer: Self-pay | Admitting: Cardiovascular Disease

## 2023-04-25 DIAGNOSIS — Z23 Encounter for immunization: Secondary | ICD-10-CM | POA: Diagnosis not present

## 2023-04-25 DIAGNOSIS — M25512 Pain in left shoulder: Secondary | ICD-10-CM | POA: Diagnosis not present

## 2023-04-25 DIAGNOSIS — M069 Rheumatoid arthritis, unspecified: Secondary | ICD-10-CM | POA: Diagnosis not present

## 2023-04-25 DIAGNOSIS — M25511 Pain in right shoulder: Secondary | ICD-10-CM | POA: Diagnosis not present

## 2023-04-25 DIAGNOSIS — G8929 Other chronic pain: Secondary | ICD-10-CM | POA: Diagnosis not present

## 2023-05-28 ENCOUNTER — Other Ambulatory Visit: Payer: Self-pay

## 2023-05-28 ENCOUNTER — Observation Stay (HOSPITAL_COMMUNITY)
Admission: EM | Admit: 2023-05-28 | Discharge: 2023-05-30 | Disposition: A | Discharge status: Home / Self Care | Payer: Medicare Other | Attending: Family Medicine | Admitting: Family Medicine

## 2023-05-28 ENCOUNTER — Emergency Department (HOSPITAL_COMMUNITY): Payer: Medicare Other

## 2023-05-28 ENCOUNTER — Encounter (HOSPITAL_COMMUNITY): Payer: Self-pay | Admitting: *Deleted

## 2023-05-28 DIAGNOSIS — R079 Chest pain, unspecified: Principal | ICD-10-CM | POA: Diagnosis present

## 2023-05-28 DIAGNOSIS — Z8679 Personal history of other diseases of the circulatory system: Secondary | ICD-10-CM

## 2023-05-28 DIAGNOSIS — I517 Cardiomegaly: Secondary | ICD-10-CM | POA: Diagnosis not present

## 2023-05-28 DIAGNOSIS — R0789 Other chest pain: Secondary | ICD-10-CM | POA: Diagnosis not present

## 2023-05-28 DIAGNOSIS — E785 Hyperlipidemia, unspecified: Secondary | ICD-10-CM | POA: Diagnosis present

## 2023-05-28 DIAGNOSIS — Z96649 Presence of unspecified artificial hip joint: Secondary | ICD-10-CM | POA: Insufficient documentation

## 2023-05-28 DIAGNOSIS — I5032 Chronic diastolic (congestive) heart failure: Secondary | ICD-10-CM | POA: Diagnosis not present

## 2023-05-28 DIAGNOSIS — I771 Stricture of artery: Secondary | ICD-10-CM | POA: Diagnosis not present

## 2023-05-28 DIAGNOSIS — Z743 Need for continuous supervision: Secondary | ICD-10-CM | POA: Diagnosis not present

## 2023-05-28 DIAGNOSIS — Z7901 Long term (current) use of anticoagulants: Secondary | ICD-10-CM | POA: Insufficient documentation

## 2023-05-28 DIAGNOSIS — I480 Paroxysmal atrial fibrillation: Secondary | ICD-10-CM | POA: Diagnosis not present

## 2023-05-28 DIAGNOSIS — I5030 Unspecified diastolic (congestive) heart failure: Secondary | ICD-10-CM

## 2023-05-28 DIAGNOSIS — I110 Hypertensive heart disease with heart failure: Secondary | ICD-10-CM | POA: Diagnosis not present

## 2023-05-28 DIAGNOSIS — I10 Essential (primary) hypertension: Secondary | ICD-10-CM | POA: Diagnosis not present

## 2023-05-28 DIAGNOSIS — Z79899 Other long term (current) drug therapy: Secondary | ICD-10-CM | POA: Diagnosis not present

## 2023-05-28 DIAGNOSIS — I160 Hypertensive urgency: Secondary | ICD-10-CM

## 2023-05-28 DIAGNOSIS — M069 Rheumatoid arthritis, unspecified: Secondary | ICD-10-CM | POA: Diagnosis present

## 2023-05-28 DIAGNOSIS — J9811 Atelectasis: Secondary | ICD-10-CM | POA: Diagnosis not present

## 2023-05-28 DIAGNOSIS — I340 Nonrheumatic mitral (valve) insufficiency: Secondary | ICD-10-CM | POA: Diagnosis present

## 2023-05-28 DIAGNOSIS — I4891 Unspecified atrial fibrillation: Secondary | ICD-10-CM | POA: Diagnosis not present

## 2023-05-28 LAB — CBC
HCT: 42.2 % (ref 36.0–46.0)
Hemoglobin: 13.5 g/dL (ref 12.0–15.0)
MCH: 31.4 pg (ref 26.0–34.0)
MCHC: 32 g/dL (ref 30.0–36.0)
MCV: 98.1 fL (ref 80.0–100.0)
Platelets: 159 10*3/uL (ref 150–400)
RBC: 4.3 MIL/uL (ref 3.87–5.11)
RDW: 13.9 % (ref 11.5–15.5)
WBC: 6.7 10*3/uL (ref 4.0–10.5)
nRBC: 0 % (ref 0.0–0.2)

## 2023-05-28 LAB — BASIC METABOLIC PANEL
Anion gap: 10 (ref 5–15)
BUN: 15 mg/dL (ref 8–23)
CO2: 24 mmol/L (ref 22–32)
Calcium: 9.1 mg/dL (ref 8.9–10.3)
Chloride: 104 mmol/L (ref 98–111)
Creatinine, Ser: 0.8 mg/dL (ref 0.44–1.00)
GFR, Estimated: >60 mL/min (ref >60)
Glucose, Bld: 97 mg/dL (ref 70–99)
Potassium: 3.6 mmol/L (ref 3.5–5.1)
Sodium: 138 mmol/L (ref 135–145)

## 2023-05-28 LAB — TROPONIN I (HIGH SENSITIVITY): Troponin I (High Sensitivity): 9 ng/L (ref <18)

## 2023-05-28 LAB — MAGNESIUM: Magnesium: 2.3 mg/dL (ref 1.7–2.4)

## 2023-05-28 LAB — BRAIN NATRIURETIC PEPTIDE: B Natriuretic Peptide: 233 pg/mL — ABNORMAL HIGH (ref 0.0–100.0)

## 2023-05-28 NOTE — ED Notes (Signed)
To x-ray

## 2023-05-28 NOTE — ED Provider Notes (Signed)
   Received signout from previous provider, please see her note for complete H&P.  This is a 89 year old female with multiple cardiac risk factors which include hypertension, HOCM, RA, A-fib currently on Eliquis, H LD presenting with chest pain since earlier today.  Described pain as a pressure sensation to the lower chest radiates to her left arm with some shortness of breath.  Her symptom has since resolved.  Workup overall reassuring negative delta troponin but due to her moderate risk factors of cardiac disease I have consulted [facility] hospitalist and spoke with Dr. Janalyn Shy who agrees to admit patient for cardiac workup.  BP (!) 156/129   Pulse 72   Temp 97.8 F (36.6 C)   Resp 20   Ht 5' (1.524 m)   Wt 62.6 kg   LMP  (LMP Unknown)   SpO2 97%   BMI 26.95 kg/m   Results for orders placed or performed during the hospital encounter of 05/28/23  Basic metabolic panel  Result Value Ref Range   Sodium 138 135 - 145 mmol/L   Potassium 3.6 3.5 - 5.1 mmol/L   Chloride 104 98 - 111 mmol/L   CO2 24 22 - 32 mmol/L   Glucose, Bld 97 70 - 99 mg/dL   BUN 15 8 - 23 mg/dL   Creatinine, Ser 9.56 0.44 - 1.00 mg/dL   Calcium 9.1 8.9 - 21.3 mg/dL   GFR, Estimated >08 >65 mL/min   Anion gap 10 5 - 15  CBC  Result Value Ref Range   WBC 6.7 4.0 - 10.5 K/uL   RBC 4.30 3.87 - 5.11 MIL/uL   Hemoglobin 13.5 12.0 - 15.0 g/dL   HCT 78.4 69.6 - 29.5 %   MCV 98.1 80.0 - 100.0 fL   MCH 31.4 26.0 - 34.0 pg   MCHC 32.0 30.0 - 36.0 g/dL   RDW 28.4 13.2 - 44.0 %   Platelets 159 150 - 400 K/uL   nRBC 0.0 0.0 - 0.2 %  Magnesium  Result Value Ref Range   Magnesium 2.3 1.7 - 2.4 mg/dL  Brain natriuretic peptide  Result Value Ref Range   B Natriuretic Peptide 233.0 (H) 0.0 - 100.0 pg/mL  Troponin I (High Sensitivity)  Result Value Ref Range   Troponin I (High Sensitivity) 9 <18 ng/L  Troponin I (High Sensitivity)  Result Value Ref Range   Troponin I (High Sensitivity) 9 <18 ng/L   DG Chest 2  View  Result Date: 05/28/2023 CLINICAL DATA:  Chest pain EXAM: CHEST - 2 VIEW COMPARISON:  Chest x-ray 09/18/2022 FINDINGS: The aorta is tortuous, unchanged. The heart is enlarged. The lungs are clear. There is no pleural effusion or pneumothorax. There is a small band of atelectasis in the right lower lung. No acute fractures are identified. IMPRESSION: 1. Cardiomegaly. 2. No acute cardiopulmonary process. Electronically Signed   By: Darliss Cheney M.D.   On: 05/28/2023 23:01      Fayrene Helper, PA-C 05/29/23 0130    Glynn Octave, MD 05/29/23 Cleophas Dunker

## 2023-05-28 NOTE — ED Provider Notes (Signed)
[facility] EMERGENCY DEPARTMENT AT [facility] Provider Note   CSN: [id number] Arrival date & time: 05/28/23  2135     History  Chief Complaint  Patient presents with   Chest Pain    Stacy [name] is a 89 y.o. female with PMH AF on Eliquis and amiodarone, HTN, RA, HOCM seen by cardiology (with no hx VT, stable echo, no defibrillator), HLD who presents for epidose of chest pain tonight. At 8:30 PM while at rest she had acute onset L sided substernal squeezing chest pain associated with SOB and L arm pain. No history of prior episodes. Lasted 15-30 min then resolved. No nausea, back pain, abdominal pain, vomiting, face symptoms, hx VTE, syncope, lightheadedness. Is compliant with eliquis and amiodarone no missed doses. No leg swelling. Lives at home with her son. Otherwise eating/drinking at baseline, no fevers cough congestion, no weight gain orthopnea PND or diagnosis of CHF. Pulse through entire event was 70.   The history is provided by the patient and medical records.       Home Medications Prior to Admission medications   Medication Sig Start Date End Date Taking? Authorizing Provider  acetaminophen (TYLENOL) 325 MG tablet Take 650 mg by mouth every 6 (six) hours as needed for moderate pain or headache.    [provider]  amiodarone (PACERONE) 200 MG tablet Take one tablet by mouth on Monday and Thursday 03/06/21   Nahser, [name], MD  Ascorbic Acid (VITAMIN C) 1000 MG tablet Take 2,000 mg by mouth daily.    [provider]  BIOTIN PO Take 1 capsule by mouth daily.    [provider]  Cholecalciferol (VITAMIN D-3 PO) Take 1 tablet by mouth daily.    [provider]  ELIQUIS 5 MG TABS tablet TAKE ONE TABLET BY MOUTH TWICE DAILY 04/04/23   Nahser, [name], MD  furosemide (LASIX) 20 MG tablet Take 1 tablet (20 mg total) by mouth every Monday, Wednesday, and Friday. 07/23/20   Nahser, [name], MD  hydrALAZINE (APRESOLINE) 25 MG tablet  Take 1 tablet 3 times daily if BP >140 07/22/20   Nahser, [name], MD  irbesartan (AVAPRO) 300 MG tablet Take 1 tablet (300 mg total) by mouth daily. 04/22/23   Nahser, [name], MD  meclizine (ANTIVERT) 25 MG tablet Take 1 tablet (25 mg total) by mouth daily as needed for dizziness. 05/13/22   Gaston Islam., NP  methotrexate 2.5 MG tablet Take 15 mg by mouth once a week. 10/01/21   [provider]  metoprolol tartrate (LOPRESSOR) 25 MG tablet Take 1 tablet (25 mg total) by mouth 2 (two) times daily. 08/02/22   Nahser, [name], MD  ondansetron (ZOFRAN-ODT) 8 MG disintegrating tablet Take 1 tablet (8 mg total) by mouth every 8 (eight) hours as needed for nausea. 09/26/22   Derwood Kaplan, MD  polyethylene glycol (MIRALAX / GLYCOLAX) 17 g packet Take 17 g by mouth daily. 09/26/22   Derwood Kaplan, MD  Polyvinyl Alcohol-Povidone (REFRESH OP) Place 1 drop into both eyes as needed (dry eyes).    [provider]  potassium chloride (KLOR-CON) 10 MEQ tablet Take 1 tablet (10 mEq total) by mouth every Monday, Wednesday, and Friday. 07/23/20   Nahser, [name], MD  PROAIR RESPICLICK 108 [phone] Base) MCG/ACT AEPB Inhale 2 puffs into the lungs every 6 (six) hours as needed (wheezing, shortnes of breath). 04/22/17   [provider]  rOPINIRole (REQUIP) 1 MG tablet Take  1 mg by mouth at bedtime.  03/27/15   [provider]  zinc gluconate 50 MG tablet Take 50 mg by mouth daily.    [provider]      Allergies    Sulfa antibiotics    Review of Systems   Review of Systems per HPI above  Physical Exam Updated Vital Signs BP (!) 156/129   Pulse 72   Resp 20   Ht 5' (1.524 m)   Wt 62.6 kg   LMP  (LMP Unknown)   SpO2 97%   BMI 26.95 kg/m  Physical Exam Vitals and nursing note reviewed.  Constitutional:      General: She is not in acute distress.    Appearance: She is well-developed. She is not ill-appearing.  HENT:     Head: Normocephalic and atraumatic.   Eyes:     Extraocular Movements: Extraocular movements intact.     Pupils: Pupils are equal, round, and reactive to light.  Cardiovascular:     Rate and Rhythm: Normal rate. Rhythm irregular.     Pulses:          Radial pulses are 2+ on the right side and 2+ on the left side.       Dorsalis pedis pulses are 2+ on the right side and 2+ on the left side.     Heart sounds: Normal heart sounds.  Pulmonary:     Effort: Pulmonary effort is normal.     Breath sounds: Normal breath sounds.  Chest:     Chest wall: No tenderness.  Abdominal:     Palpations: Abdomen is soft. There is no mass.     Tenderness: There is no abdominal tenderness. There is no guarding.  Musculoskeletal:     Cervical back: Normal range of motion.     Right lower leg: No tenderness. No edema.     Left lower leg: No tenderness. No edema.  Skin:    General: Skin is warm and dry.     Capillary Refill: Capillary refill takes less than 2 seconds.  Neurological:     General: No focal deficit present.     Mental Status: She is alert.     Cranial Nerves: No cranial nerve deficit.     Sensory: No sensory deficit.     Motor: No weakness.     Coordination: Coordination normal.     ED Results / Procedures / Treatments   Labs (all labs ordered are listed, but only abnormal results are displayed) Labs Reviewed  BRAIN NATRIURETIC PEPTIDE - Abnormal; Notable for the following components:      Result Value   B Natriuretic Peptide 233.0 (*)    All other components within normal limits  BASIC METABOLIC PANEL  CBC  MAGNESIUM  TROPONIN I (HIGH SENSITIVITY)  TROPONIN I (HIGH SENSITIVITY)    EKG EKG Interpretation Date/Time:  Saturday May 28 2023 21:43:37 EST Ventricular Rate:  75 PR Interval:    QRS Duration:  102 QT Interval:  431 QTC Calculation: 482 R Axis:   35  Text Interpretation: Atrial fibrillation Anteroseptal infarct, old Minimal ST depression, diffuse leads No significant change since last tracing  Confirmed by Gwyneth Sprout ([id number]) on 05/28/2023 10:36:09 PM  Radiology DG Chest 2 View  Result Date: 05/28/2023 CLINICAL DATA:  Chest pain EXAM: CHEST - 2 VIEW COMPARISON:  Chest x-ray 09/18/2022 FINDINGS: The aorta is tortuous, unchanged. The heart is enlarged. The lungs are clear. There is no pleural effusion or pneumothorax. There is  a small band of atelectasis in the right lower lung. No acute fractures are identified. IMPRESSION: 1. Cardiomegaly. 2. No acute cardiopulmonary process. Electronically Signed   By: Darliss Cheney M.D.   On: 05/28/2023 23:01    Procedures Procedures    Medications Ordered in ED Medications - No data to display  ED Course/ Medical Decision Making/ A&P             HEART Score: 5                    Medical Decision Making Amount and/or Complexity of Data Reviewed Labs: ordered. Radiology: ordered.   89 year old lady with history of hypertension, A-fib on Eliquis and amiodarone who presents with acute onset chest pain that has since resolved.  At this time it is less than 2 hours since the onset of the pain.  She is hemodynamically stable and rate controlled atrial fibrillation on telemetry on my interpretation.  She is on room air with no tachypnea.  She is not ill, septic, or toxic appearing.  I am concerned for ACS or unstable angina given the characteristics of her symptoms, her age, and hypertension is a comorbidity.  I considered the possibility for PE but favor this to be unlikely as symptoms did not persist, she is anticoagulated, she is not tachycardic or tachypneic, has no evidence of DVT.  Also considered dissection but with normal neurovascular exam and full return to asymptomatic baseline doubt dissection.  Consider the possibility of dysrhythmia given history of hokum but favor this to be unlikely as her pulse remained 70 the entire time. Other items on differential include pneumonia, pneumothorax, arrhythmia, electrolyte or metabolic  derangement, anemia, pericarditis or myocarditis, panic attack, GERD.  Plan to evaluate with EKG, 2 troponins given less than 3 hours since pain stopped, blood counts and electrolytes, chest x-ray.   EKG shows rate controlled atrial fibrillation at a rate of 75, normal axis, QTc 482, normal QRS width, no ST segment elevations, no pathologic T wave inversions but difficult to interpret due to background atrial activity  Labs show no leukocytosis, normal hemoglobin of 13.5, normal renal function electrolytes no evidence of metabolic derangement, BNP is 233 though previously has been similar and at this time she is entirely symptom-free unlikely to be representative of an ongoing CHF picture.  Initial troponin is 9.  2-hour reflex is pending  CXR shows cardiomegaly, no interstitial edema pneumonia pneumothorax or other acute finding.  HEART Score is 5, moderate risk. Plan for admission/observation for inpatient stress test if 2nd troponin not elevated. I discussed this with patient and she is agreeable. Care handed to oncoming APP pending 2nd trop. See their note for further detail and MDM.           Final Clinical Impression(s) / ED Diagnoses Final diagnoses:  Chest pain, unspecified type    Rx / DC Orders ED Discharge Orders     None         Karmen Stabs, MD 05/28/23 2346    Gwyneth Sprout, MD 05/30/23 0105

## 2023-05-28 NOTE — ED Triage Notes (Signed)
The pt arrived by [place] from home in [place]  she had some chest pain from 2000-2100  no chest pain at present and no sob she is currently in atrial flutter she has also been feeling tired elevated bp  pt is alert and oriented x4  family with the pt

## 2023-05-29 ENCOUNTER — Encounter (HOSPITAL_COMMUNITY): Payer: Self-pay | Admitting: Internal Medicine

## 2023-05-29 ENCOUNTER — Inpatient Hospital Stay (HOSPITAL_COMMUNITY): Payer: Medicare Other

## 2023-05-29 DIAGNOSIS — I5032 Chronic diastolic (congestive) heart failure: Secondary | ICD-10-CM | POA: Diagnosis not present

## 2023-05-29 DIAGNOSIS — I160 Hypertensive urgency: Secondary | ICD-10-CM | POA: Diagnosis not present

## 2023-05-29 DIAGNOSIS — R0789 Other chest pain: Secondary | ICD-10-CM | POA: Diagnosis not present

## 2023-05-29 DIAGNOSIS — E785 Hyperlipidemia, unspecified: Secondary | ICD-10-CM

## 2023-05-29 DIAGNOSIS — I480 Paroxysmal atrial fibrillation: Secondary | ICD-10-CM

## 2023-05-29 DIAGNOSIS — I340 Nonrheumatic mitral (valve) insufficiency: Secondary | ICD-10-CM

## 2023-05-29 DIAGNOSIS — R079 Chest pain, unspecified: Secondary | ICD-10-CM | POA: Diagnosis not present

## 2023-05-29 DIAGNOSIS — R0609 Other forms of dyspnea: Secondary | ICD-10-CM | POA: Insufficient documentation

## 2023-05-29 DIAGNOSIS — R918 Other nonspecific abnormal finding of lung field: Secondary | ICD-10-CM | POA: Insufficient documentation

## 2023-05-29 DIAGNOSIS — R55 Syncope and collapse: Secondary | ICD-10-CM | POA: Insufficient documentation

## 2023-05-29 DIAGNOSIS — Z8679 Personal history of other diseases of the circulatory system: Secondary | ICD-10-CM

## 2023-05-29 DIAGNOSIS — Z77090 Contact with and (suspected) exposure to asbestos: Secondary | ICD-10-CM | POA: Insufficient documentation

## 2023-05-29 DIAGNOSIS — I5030 Unspecified diastolic (congestive) heart failure: Secondary | ICD-10-CM

## 2023-05-29 DIAGNOSIS — J90 Pleural effusion, not elsewhere classified: Secondary | ICD-10-CM | POA: Insufficient documentation

## 2023-05-29 LAB — COMPREHENSIVE METABOLIC PANEL
ALT: 16 U/L (ref 0–44)
AST: 18 U/L (ref 15–41)
Albumin: 3.4 g/dL — ABNORMAL LOW (ref 3.5–5.0)
Alkaline Phosphatase: 62 U/L (ref 38–126)
Anion gap: 7 (ref 5–15)
BUN: 12 mg/dL (ref 8–23)
CO2: 27 mmol/L (ref 22–32)
Calcium: 9.2 mg/dL (ref 8.9–10.3)
Chloride: 107 mmol/L (ref 98–111)
Creatinine, Ser: 0.73 mg/dL (ref 0.44–1.00)
GFR, Estimated: >60 mL/min (ref >60)
Glucose, Bld: 101 mg/dL — ABNORMAL HIGH (ref 70–99)
Potassium: 3.6 mmol/L (ref 3.5–5.1)
Sodium: 141 mmol/L (ref 135–145)
Total Bilirubin: 0.7 mg/dL (ref <1.2)
Total Protein: 5.9 g/dL — ABNORMAL LOW (ref 6.5–8.1)

## 2023-05-29 LAB — ECHOCARDIOGRAM COMPLETE
AR max vel: 2.51 cm2
AV Area VTI: 2.46 cm2
AV Area mean vel: 2.21 cm2
AV Mean grad: 5 mm[Hg]
AV Peak grad: 7.6 mm[Hg]
Ao pk vel: 1.38 m/s
Area-P 1/2: 2.9 cm2
Height: 60 in
MV VTI: 1.74 cm2
P 1/2 time: 699 ms
S' Lateral: 2.9 cm
Weight: 2208.13 [oz_av]

## 2023-05-29 LAB — CBC
HCT: 40.2 % (ref 36.0–46.0)
Hemoglobin: 12.8 g/dL (ref 12.0–15.0)
MCH: 31 pg (ref 26.0–34.0)
MCHC: 31.8 g/dL (ref 30.0–36.0)
MCV: 97.3 fL (ref 80.0–100.0)
Platelets: 164 10*3/uL (ref 150–400)
RBC: 4.13 MIL/uL (ref 3.87–5.11)
RDW: 13.9 % (ref 11.5–15.5)
WBC: 6.5 10*3/uL (ref 4.0–10.5)
nRBC: 0 % (ref 0.0–0.2)

## 2023-05-29 LAB — TROPONIN I (HIGH SENSITIVITY)
Troponin I (High Sensitivity): 10 ng/L (ref <18)
Troponin I (High Sensitivity): 8 ng/L (ref <18)
Troponin I (High Sensitivity): 9 ng/L (ref <18)

## 2023-05-29 LAB — MRSA NEXT GEN BY PCR, NASAL: MRSA by PCR Next Gen: NOT DETECTED

## 2023-05-29 MED ORDER — SODIUM CHLORIDE 0.9 % IV SOLN: 250.0000 mL | INTRAVENOUS | Status: AC | PRN

## 2023-05-29 MED ORDER — IRBESARTAN 150 MG PO TABS
300.0000 mg | ORAL_TABLET | Freq: Every day | ORAL | Status: DC
  Administered 2023-05-29 – 2023-05-30 (×2): 300 mg via ORAL
  Filled 2023-05-29: qty 1
  Filled 2023-05-29: qty 2

## 2023-05-29 MED ORDER — AMIODARONE HCL 200 MG PO TABS
200.0000 mg | ORAL_TABLET | ORAL | Status: DC
  Administered 2023-05-30: 200 mg via ORAL
  Filled 2023-05-29: qty 1

## 2023-05-29 MED ORDER — ACETAMINOPHEN 325 MG PO TABS
650.0000 mg | ORAL_TABLET | Freq: Four times a day (QID) | ORAL | Status: DC | PRN
  Administered 2023-05-29: 650 mg via ORAL
  Filled 2023-05-29: qty 2

## 2023-05-29 MED ORDER — AMIODARONE HCL 200 MG PO TABS: 200.0000 mg | ORAL_TABLET | Freq: Every day | ORAL | Status: DC

## 2023-05-29 MED ORDER — FUROSEMIDE 20 MG PO TABS
20.0000 mg | ORAL_TABLET | ORAL | Status: DC
  Administered 2023-05-30: 20 mg via ORAL
  Filled 2023-05-29: qty 1

## 2023-05-29 MED ORDER — SODIUM CHLORIDE 0.9% FLUSH
3.0000 mL | Freq: Two times a day (BID) | INTRAVENOUS | Status: DC
  Administered 2023-05-29 – 2023-05-30 (×4): 3 mL via INTRAVENOUS

## 2023-05-29 MED ORDER — SODIUM CHLORIDE 0.9% FLUSH
3.0000 mL | INTRAVENOUS | Status: DC | PRN
Start: 2023-05-29 — End: 2023-05-30

## 2023-05-29 MED ORDER — METOPROLOL TARTRATE 25 MG PO TABS
25.0000 mg | ORAL_TABLET | Freq: Two times a day (BID) | ORAL | Status: DC
  Administered 2023-05-29 – 2023-05-30 (×4): 25 mg via ORAL
  Filled 2023-05-29 (×4): qty 1

## 2023-05-29 MED ORDER — DOCUSATE SODIUM 100 MG PO CAPS
100.0000 mg | ORAL_CAPSULE | Freq: Two times a day (BID) | ORAL | Status: DC
  Administered 2023-05-29: 100 mg via ORAL
  Filled 2023-05-29 (×3): qty 1

## 2023-05-29 MED ORDER — APIXABAN 5 MG PO TABS
5.0000 mg | ORAL_TABLET | Freq: Two times a day (BID) | ORAL | Status: DC
  Administered 2023-05-29 – 2023-05-30 (×4): 5 mg via ORAL
  Filled 2023-05-29 (×4): qty 1

## 2023-05-29 MED ORDER — HYDRALAZINE HCL 20 MG/ML IJ SOLN: 5.0000 mg | Freq: Four times a day (QID) | INTRAMUSCULAR | Status: DC | PRN

## 2023-05-29 MED ORDER — ACETAMINOPHEN 650 MG RE SUPP: 650.0000 mg | Freq: Four times a day (QID) | RECTAL | Status: DC | PRN

## 2023-05-29 NOTE — Plan of Care (Signed)

## 2023-05-29 NOTE — Plan of Care (Signed)

## 2023-05-29 NOTE — ED Notes (Signed)
ED TO INPATIENT HANDOFF REPORT  ED Nurse Name and Phone #: [phone] chris c rn  S Name/Age/Gender Stacy [name] 89 y.o. female Room/Bed: 015C/015C  Code Status   Code Status: Full Code  Home/SNF/Other Home Patient oriented to: self, place, time, and situation Is this baseline? Yes   Triage Complete: Triage complete  Chief Complaint Chest pain [R07.9]  Triage Note The pt arrived by [place] from home in [place]  she had some chest pain from 2000-2100  no chest pain at present and no sob she is currently in atrial flutter she has also been feeling tired elevated bp  pt is alert and oriented x4  family with the pt    Allergies Allergies  Allergen Reactions   Sulfa Antibiotics Nausea Only    Level of Care/Admitting Diagnosis ED Disposition     ED Disposition  Admit   Condition  --   Comment  Hospital Area: [facility] [[id number]]  Level of Care: Telemetry Cardiac [[id number]]  May admit patient to [facility] or [facility] if equivalent level of care is available:: No  Covid Evaluation: Asymptomatic - no recent exposure (last 10 days) testing not required  Diagnosis: Chest pain [744799]  Admitting Physician: Tereasa Coop [[id number]]  Attending Physician: Tereasa Coop [[id number]]  Certification:: I certify this patient will need inpatient services for at least 2 midnights  Expected Medical Readiness: 06/03/2023          B Medical/Surgery History Past Medical History:  Diagnosis Date   Bowel obstruction (HCC)    Diastolic dysfunction    Dizziness    Gallbladder disease    GERD (gastroesophageal reflux disease)    HOCM (hypertrophic obstructive cardiomyopathy) (HCC) 09/27/2016   Echo 10/16: severe LVH, EF 55-60, peak 32, no RWMA, +SAM, mild to mod MR, severe LAE, PASP 28 // Echo 2/18: mod conc LVH, severe septal LVH c/w HCM, EF 60-65, peak LVOT 94, no RWMA, + SAM, severe MR, severe LAE, PASP 25 // Echo 6/18: severe conc LVH, EF 60-65, noRWMA,  Gr 2 DD, peak LVOT gradient 20, mild aortic stenosis, mild AI, mild MR, severe LAE, mild TR, PASP 33    Hypertension    Hypertrophic obstructive cardiomyopathy with diastolic heart failure (HCC)    LVH (left ventricular hypertrophy)    WITH ASYMMETRIC SEPTAL HYPERTROPHY   Mitral regurgitation 09/09/2016   Severe by echo 08/2016   Persistent atrial fibrillation (HCC) 09/09/2016   Rheumatoid arthritis (HCC)    RLS (restless legs syndrome)    Past Surgical History:  Procedure Laterality Date   ABDOMINAL HYSTERECTOMY     APPENDECTOMY     CARDIOVASCULAR STRESS TEST  03/10/2006   EF 69%. NO EVIDENCE OF ISCHEMIA   CARDIOVERSION N/A 03/21/2018   Procedure: Cancelled Cardioversion;  Surgeon: Elease Hashimoto [name], MD;  Location: [facility] ENDOSCOPY;  Service: Cardiovascular;  Laterality: N/A;   CATARACT EXTRACTION     CHOLECYSTECTOMY     TOTAL HIP ARTHROPLASTY     US ECHOCARDIOGRAPHY  02/21/2008   EF 55-60%   US ECHOCARDIOGRAPHY  07/21/2005   EF 55-60%     A IV Location/Drains/Wounds Patient Lines/Drains/Airways Status     Active Line/Drains/Airways     Name Placement date Placement time Site Days   Peripheral IV 05/28/23 20 G Right Antecubital 05/28/23  2228  Antecubital  1            Intake/Output Last 24 hours No intake or output data in the 24 hours ending 05/29/23  0145  Labs/Imaging Results for orders placed or performed during the hospital encounter of 05/28/23 (from the past 48 hour(s))  Basic metabolic panel     Status: None   Collection Time: 05/28/23 10:31 PM  Result Value Ref Range   Sodium 138 135 - 145 mmol/L   Potassium 3.6 3.5 - 5.1 mmol/L   Chloride 104 98 - 111 mmol/L   CO2 24 22 - 32 mmol/L   Glucose, Bld 97 70 - 99 mg/dL    Comment: Glucose reference range applies only to samples taken after fasting for at least 8 hours.   BUN 15 8 - 23 mg/dL   Creatinine, Ser 1.30 0.44 - 1.00 mg/dL   Calcium 9.1 8.9 - 86.5 mg/dL   GFR, Estimated >78 >46 mL/min    Comment:  (NOTE) Calculated using the CKD-EPI Creatinine Equation (2021)    Anion gap 10 5 - 15    Comment: Performed at [facility], [address], [place], [place] [address]  CBC     Status: None   Collection Time: 05/28/23 10:31 PM  Result Value Ref Range   WBC 6.7 4.0 - 10.5 K/uL   RBC 4.30 3.87 - 5.11 MIL/uL   Hemoglobin 13.5 12.0 - 15.0 g/dL   HCT 28.4 13.2 - 44.0 %   MCV 98.1 80.0 - 100.0 fL   MCH 31.4 26.0 - 34.0 pg   MCHC 32.0 30.0 - 36.0 g/dL   RDW 10.2 72.5 - 36.6 %   Platelets 159 150 - 400 K/uL   nRBC 0.0 0.0 - 0.2 %    Comment: Performed at [facility], [address], [place], [place] [address]  Troponin I (High Sensitivity)     Status: None   Collection Time: 05/28/23 10:31 PM  Result Value Ref Range   Troponin I (High Sensitivity) 9 <18 ng/L    Comment: (NOTE) Elevated high sensitivity troponin I (hsTnI) values and significant  changes across serial measurements may suggest ACS but many other  chronic and acute conditions are known to elevate hsTnI results.  Refer to the "Links" section for chest pain algorithms and additional  guidance. Performed at [facility], [address] [place], [place] [address]   Magnesium     Status: None   Collection Time: 05/28/23 10:31 PM  Result Value Ref Range   Magnesium 2.3 1.7 - 2.4 mg/dL    Comment: Performed at [facility], [address], [place], [place] [address]  Brain natriuretic peptide     Status: Abnormal   Collection Time: 05/28/23 10:31 PM  Result Value Ref Range   B Natriuretic Peptide 233.0 (H) 0.0 - 100.0 pg/mL    Comment: Performed at [facility], [address] [place], [place] [address]  Troponin I (High Sensitivity)     Status: None   Collection Time: 05/29/23 12:09 AM  Result Value Ref Range   Troponin I (High Sensitivity) 9 <18 ng/L    Comment: (NOTE) Elevated high sensitivity troponin I (hsTnI) values and significant  changes across serial measurements may suggest ACS  but many other  chronic and acute conditions are known to elevate hsTnI results.  Refer to the "Links" section for chest pain algorithms and additional  guidance. Performed at [facility], [address] [place], [place] [address]    DG Chest 2 View  Result Date: 05/28/2023 CLINICAL DATA:  Chest pain EXAM: CHEST - 2 VIEW COMPARISON:  Chest x-ray 09/18/2022 FINDINGS: The aorta is tortuous, unchanged. The heart is enlarged. The lungs are clear. There is no pleural effusion or pneumothorax. There is a small band of atelectasis in the right lower lung. No acute fractures are identified. IMPRESSION: 1. Cardiomegaly. 2. No acute cardiopulmonary process. Electronically Signed   By: Darliss Cheney M.D.   On: 05/28/2023 23:01    Pending Labs Unresulted Labs (From admission, onward)     Start     Ordered   05/29/23 0500  Comprehensive metabolic panel  Tomorrow morning,   R        05/29/23 0137   05/29/23 0500  CBC  Tomorrow morning,   R        05/29/23 0137            Vitals/Pain Today's Vitals   05/28/23 2231 05/28/23 2315 05/28/23 2330 05/28/23 2354  BP:  (!) 158/90 (!) 156/129   Pulse:  73 72   Resp:  20 20   Temp:    97.8 F (36.6 C)  SpO2:  98% 97%   Weight:      Height:      PainSc: 0-No pain       Isolation Precautions No active isolations  Medications Medications  amiodarone (PACERONE) tablet 200 mg (has no administration in time range)  irbesartan (AVAPRO) tablet 300 mg (has no administration in time range)  metoprolol tartrate (LOPRESSOR) tablet 25 mg (has no administration in time range)  apixaban (ELIQUIS) tablet 5 mg (has no administration in time range)  sodium chloride flush (NS) 0.9 % injection 3 mL (has no administration in time range)  sodium chloride flush (NS) 0.9 % injection 3 mL (has no administration in time range)  sodium chloride flush (NS) 0.9 % injection 3 mL (has no administration in time range)  0.9 %  sodium chloride infusion (has no  administration in time range)  acetaminophen (TYLENOL) tablet 650 mg (has no administration in time range)    Or  acetaminophen (TYLENOL) suppository 650 mg (has no administration in time range)  docusate sodium (COLACE) capsule 100 mg (has no administration in time range)  hydrALAZINE (APRESOLINE) injection 5 mg (has no administration in time range)  amiodarone (PACERONE) tablet 200 mg (has no administration in time range)  amiodarone (PACERONE) tablet 200 mg (has no administration in time range)    Mobility walks     Focused Assessments Cardiac Assessment Handoff:    Lab Results  Component Value Date   TROPONINI 0.52 (HH) 09/09/2016   Lab Results  Component Value Date   DDIMER 0.48 09/08/2021   Does the Patient currently have chest pain? No    R Recommendations: See Admitting Provider Note  Report given to:   Additional Notes:

## 2023-05-29 NOTE — Care Plan (Signed)
This 89 y.o. female with medical history significant of hypertrophic cardiomyopathy, hyperlipidemia, heart failure with preserved ejection fraction 60 to 65%, paroxysmal atrial fibrillation on Eliquis, Essential hypertension and mitral regurgitation presented in the ED for evaluation for chest pain.  Patient report she was in her bed and suddenly started chest pain on the left side radiating towards left arm and associated with shortness of breath.  Chest pain lasted for 15 to 30 minutes and then resolved.  Of note patient takes amiodarone 200 mg on Monday and Thursdays and Eliquis 5 mg twice daily for A-fib.  In the ED workup reveals BNP 233, troponin x 2 negative.  EKG shows A-fib with normal ventricular rate.  Patient has been admitted for further evaluation.  Chest pain seems secondary to hypertensive urgency.  Blood pressure is now improved.  Patient was seen and examined at bedside.  She reports doing better.  Echocardiogram is pending.

## 2023-05-29 NOTE — H&P (Addendum)
History and Physical    Stacy [name] NGE:[id number] DOB: 05-14-1929 DOA: 05/28/2023  PCP: Paulina Fusi, MD   Patient coming from: Home   Chief Complaint:  Chief Complaint  Patient presents with   Chest Pain   ED TRIAGE note:The pt arrived by [place] from home in [place] she had some chest pain from 2000-2100 no chest pain at present and no sob she is currently in atrial flutter she has also been feeling tired elevated bp pt is alert and oriented x4 family with the pt   HPI:  Stacy [name] is a 89 y.o. female with medical history significant of hypertrophic cardiomyopathy, hyperlipidemia, heart failure preserved ejection fraction 60 to 65%, paroxysmal atrial fibrillation on Eliquis, essential hypertension and mitral regurgitation presented emergency department evaluation for chest pain that started around 8:30 PM tonight.  Patient reported that she was in her bed suddenly noticed chest pain around 8:30 PM, chest pain acutely started on left side of her chest with sharp sternal squeezing with associated shortness of breath and left-sided arm pain.  Lasted for 15 to 30-minute and then resolved.  Denies any nausea, palpitation, vomiting, abdominal pain, lightheadedness and syncope.  Patient denies any leg swelling.  Patient denies any heartburn, nausea, abdominal pain constipation diarrhea.  Denies any orthopnea, PND and dyspnea. Of note, patient takes amiodarone 200 mg on Monday and Thursday (verified with pharmacy) and Eliquis 5 mg twice daily.   ED Course:  At presentation to ED heart rate 60, found to have elevated blood pressure 195/92 which has been improved to 156/129.  O2 sat 99% room air respiratory rate 14. High sensitive troponin x 2 is 9 within normal range. Elevated BNP 233. BMP unremarkable.  CBC unremarkable. EKG showing atrial fibrillation rate controlled 75.  There is no ST and T wave abnormality. Chest x-ray cardiomegaly.  No acute cardiopulmonary  process.  In the ED no intervention has been required.  Hospitalist has been contacted for further management of chest pain.  Review of Systems:  Review of Systems  Constitutional:  Negative for chills, fever, malaise/fatigue and weight loss.  HENT:  Negative for hearing loss and tinnitus.   Eyes:  Negative for blurred vision.  Respiratory:  Negative for cough, sputum production and shortness of breath.   Cardiovascular:  Negative for chest pain, palpitations, claudication and leg swelling.  Gastrointestinal:  Negative for heartburn and nausea.  Neurological:  Negative for dizziness, weakness and headaches.  Psychiatric/Behavioral:  The patient is not nervous/anxious.     Past Medical History:  Diagnosis Date   Bowel obstruction (HCC)    Diastolic dysfunction    Dizziness    Gallbladder disease    GERD (gastroesophageal reflux disease)    HOCM (hypertrophic obstructive cardiomyopathy) (HCC) 09/27/2016   Echo 10/16: severe LVH, EF 55-60, peak 32, no RWMA, +SAM, mild to mod MR, severe LAE, PASP 28 // Echo 2/18: mod conc LVH, severe septal LVH c/w HCM, EF 60-65, peak LVOT 94, no RWMA, + SAM, severe MR, severe LAE, PASP 25 // Echo 6/18: severe conc LVH, EF 60-65, noRWMA, Gr 2 DD, peak LVOT gradient 20, mild aortic stenosis, mild AI, mild MR, severe LAE, mild TR, PASP 33    Hypertension    Hypertrophic obstructive cardiomyopathy with diastolic heart failure (HCC)    LVH (left ventricular hypertrophy)    WITH ASYMMETRIC SEPTAL HYPERTROPHY   Mitral regurgitation 09/09/2016   Severe by echo 08/2016   Persistent atrial fibrillation (HCC) 09/09/2016  Rheumatoid arthritis (HCC)    RLS (restless legs syndrome)     Past Surgical History:  Procedure Laterality Date   ABDOMINAL HYSTERECTOMY     APPENDECTOMY     CARDIOVASCULAR STRESS TEST  03/10/2006   EF 69%. NO EVIDENCE OF ISCHEMIA   CARDIOVERSION N/A 03/21/2018   Procedure: Cancelled Cardioversion;  Surgeon: Elease Hashimoto [name], MD;   Location: [facility] ENDOSCOPY;  Service: Cardiovascular;  Laterality: N/A;   CATARACT EXTRACTION     CHOLECYSTECTOMY     TOTAL HIP ARTHROPLASTY     US ECHOCARDIOGRAPHY  02/21/2008   EF 55-60%   US ECHOCARDIOGRAPHY  07/21/2005   EF 55-60%     reports that she has never smoked. She has never used smokeless tobacco. She reports that she does not drink alcohol and does not use drugs.  Allergies  Allergen Reactions   Sulfa Antibiotics Nausea Only    Family History  Problem Relation Age of Onset   Hypertension Mother    Stroke Mother    Melanoma Mother    Leukemia Father    Stroke Sister    Hypertension Sister    Alcohol abuse Brother    Heart attack Brother    Breast cancer Daughter     Prior to Admission medications   Medication Sig Start Date End Date Taking? Authorizing Provider  acetaminophen (TYLENOL) 325 MG tablet Take 650 mg by mouth every 6 (six) hours as needed for moderate pain or headache.    [provider]  amiodarone (PACERONE) 200 MG tablet Take one tablet by mouth on Monday and Thursday 03/06/21   Nahser, [name], MD  Ascorbic Acid (VITAMIN C) 1000 MG tablet Take 2,000 mg by mouth daily.    [provider]  BIOTIN PO Take 1 capsule by mouth daily.    [provider]  Cholecalciferol (VITAMIN D-3 PO) Take 1 tablet by mouth daily.    [provider]  ELIQUIS 5 MG TABS tablet TAKE ONE TABLET BY MOUTH TWICE DAILY 04/04/23   Nahser, [name], MD  furosemide (LASIX) 20 MG tablet Take 1 tablet (20 mg total) by mouth every Monday, Wednesday, and Friday. 07/23/20   Nahser, [name], MD  hydrALAZINE (APRESOLINE) 25 MG tablet Take 1 tablet 3 times daily if BP >140 07/22/20   Nahser, [name], MD  irbesartan (AVAPRO) 300 MG tablet Take 1 tablet (300 mg total) by mouth daily. 04/22/23   Nahser, [name], MD  meclizine (ANTIVERT) 25 MG tablet Take 1 tablet (25 mg total) by mouth daily as needed for dizziness. 05/13/22   Gaston Islam., NP   methotrexate 2.5 MG tablet Take 15 mg by mouth once a week. 10/01/21   [provider]  metoprolol tartrate (LOPRESSOR) 25 MG tablet Take 1 tablet (25 mg total) by mouth 2 (two) times daily. 08/02/22   Nahser, [name], MD  ondansetron (ZOFRAN-ODT) 8 MG disintegrating tablet Take 1 tablet (8 mg total) by mouth every 8 (eight) hours as needed for nausea. 09/26/22   Derwood Kaplan, MD  polyethylene glycol (MIRALAX / GLYCOLAX) 17 g packet Take 17 g by mouth daily. 09/26/22   Derwood Kaplan, MD  Polyvinyl Alcohol-Povidone (REFRESH OP) Place 1 drop into both eyes as needed (dry eyes).    [provider]  potassium chloride (KLOR-CON) 10 MEQ tablet Take 1 tablet (10 mEq total) by mouth every Monday, Wednesday, and Friday. 07/23/20   Nahser, [name], MD  PROAIR RESPICLICK 108 [phone] Base) MCG/ACT AEPB  Inhale 2 puffs into the lungs every 6 (six) hours as needed (wheezing, shortnes of breath). 04/22/17   [provider]  rOPINIRole (REQUIP) 1 MG tablet Take 1 mg by mouth at bedtime.  03/27/15   [provider]  zinc gluconate 50 MG tablet Take 50 mg by mouth daily.    [provider]     Physical Exam: Vitals:   05/28/23 2215 05/28/23 2315 05/28/23 2330 05/28/23 2354  BP: (!) 160/102 (!) 158/90 (!) 156/129   Pulse: 72 73 72   Resp: 17 20 20    Temp:    97.8 F (36.6 C)  SpO2: 99% 98% 97%   Weight:      Height:        Physical Exam Constitutional:      General: She is not in acute distress.    Appearance: She is not ill-appearing.  HENT:     Head: Normocephalic.  Cardiovascular:     Rate and Rhythm: Normal rate. Rhythm irregular.     Chest Wall: PMI is not displaced.     Heart sounds: Murmur heard.  Pulmonary:     Effort: Pulmonary effort is normal.     Breath sounds: Normal breath sounds.  Abdominal:     Palpations: Abdomen is soft.  Musculoskeletal:     Cervical back: Normal range of motion and neck supple.     Right lower leg: No edema.      Left lower leg: No edema.  Skin:    Capillary Refill: Capillary refill takes less than 2 seconds.  Neurological:     Mental Status: She is alert and oriented to person, place, and time.  Psychiatric:        Mood and Affect: Mood normal.      Labs on Admission: I have personally reviewed following labs and imaging studies  CBC: Recent Labs  Lab 05/28/23 2231  WBC 6.7  HGB 13.5  HCT 42.2  MCV 98.1  PLT 159   Basic Metabolic Panel: Recent Labs  Lab 05/28/23 2231  NA 138  K 3.6  CL 104  CO2 24  GLUCOSE 97  BUN 15  CREATININE 0.80  CALCIUM 9.1  MG 2.3   GFR: Estimated Creatinine Clearance: 37 mL/min (by C-G formula based on SCr of 0.8 mg/dL). Liver Function Tests: No results for input(s): "AST", "ALT", "ALKPHOS", "BILITOT", "PROT", "ALBUMIN" in the last 168 hours. No results for input(s): "LIPASE", "AMYLASE" in the last 168 hours. No results for input(s): "AMMONIA" in the last 168 hours. Coagulation Profile: No results for input(s): "INR", "PROTIME" in the last 168 hours. Cardiac Enzymes: Recent Labs  Lab 05/28/23 2231 05/29/23 0009  TROPONINIHS 9 9   BNP (last 3 results) Recent Labs    05/28/23 2231  BNP 233.0*   HbA1C: No results for input(s): "HGBA1C" in the last 72 hours. CBG: No results for input(s): "GLUCAP" in the last 168 hours. Lipid Profile: No results for input(s): "CHOL", "HDL", "LDLCALC", "TRIG", "CHOLHDL", "LDLDIRECT" in the last 72 hours. Thyroid Function Tests: No results for input(s): "TSH", "T4TOTAL", "FREET4", "T3FREE", "THYROIDAB" in the last 72 hours. Anemia Panel: No results for input(s): "VITAMINB12", "FOLATE", "FERRITIN", "TIBC", "IRON", "RETICCTPCT" in the last 72 hours. Urine analysis:    Component Value Date/Time   COLORURINE YELLOW 09/26/2022 2002   APPEARANCEUR CLEAR 09/26/2022 2002   LABSPEC 1.005 09/26/2022 2002   PHURINE 6.0 09/26/2022 2002   GLUCOSEU NEGATIVE 09/26/2022 2002   HGBUR NEGATIVE 09/26/2022 2002    BILIRUBINUR  NEGATIVE 09/26/2022 2002   KETONESUR NEGATIVE 09/26/2022 2002   PROTEINUR NEGATIVE 09/26/2022 2002   NITRITE NEGATIVE 09/26/2022 2002   LEUKOCYTESUR NEGATIVE 09/26/2022 2002    Radiological Exams on Admission: I have personally reviewed images DG Chest 2 View  Result Date: 05/28/2023 CLINICAL DATA:  Chest pain EXAM: CHEST - 2 VIEW COMPARISON:  Chest x-ray 09/18/2022 FINDINGS: The aorta is tortuous, unchanged. The heart is enlarged. The lungs are clear. There is no pleural effusion or pneumothorax. There is a small band of atelectasis in the right lower lung. No acute fractures are identified. IMPRESSION: 1. Cardiomegaly. 2. No acute cardiopulmonary process. Electronically Signed   By: Darliss Cheney M.D.   On: 05/28/2023 23:01    EKG: My personal interpretation of EKG shows: EKG showing atrial fibrillation rate controlled 75.  There is no ST and T wave abnormality.    Assessment/Plan: Principal Problem:   Chest pain Active Problems:   History of hypertrophic cardiomyopathy   Heart failure with preserved ejection fraction (HCC)   Hypertensive urgency   Hyperlipidemia   Rheumatoid arthritis (HCC)   Mitral regurgitation   Chronic diastolic CHF (congestive heart failure) (HCC)   Paroxysmal atrial fibrillation (HCC)    Assessment and Plan: Chest pain > Patient presenting with complaining of 1 episode of substernal left-sided chest pain which radiated to left-sided arm.  Chest pain is was squeezing in quality, lasted for 15 to 20-minute happened while patient was at rest and resolved without any intervention.  The patient denies any shortness of breath and palpitation.  Denies any lower extremity swelling.  She is compliant with blood pressure regimen.  Patient reported she always has difficult to control her blood pressure at home. - At presentation to ED blood pressure was 195/92 which is improved to 156/129 without any intervention. -Troponin x 2 within normal range and  there is no delta change.  EKG showing atrial fibrillation rate controlled to 75.  There is no ST anterior abnormality.  Patient denies any chest pain now.  Acute coronary syndrome ruled out. -Slight elevated BNP 233.  Chest x-ray showed cardiomegaly without any acute cardiopulmonary process.  Patient is euvolemic on physical exam.  No concern for CHF exacerbation at this time. - Etiology of chest pain secondary to hypertensive urgency at this point. - Continue to trend troponin. - Obtaining echocardiogram.  If echocardiogram shows any wall motion abnormality in that case we will consult/involve cardiology. -Continue cardiac monitoring.   Hypertensive urgency - At presentation to ED blood pressure was 195/92 which is improved to 156/129 without any intervention. -Troponin x 2 within normal range and there is no delta change.  EKG showing atrial fibrillation rate controlled to 75.  There is no ST anterior abnormality.  Patient denies any chest pain now.  Acute coronary syndrome ruled out. -Slight elevated BNP 233.  Chest x-ray showed cardiomegaly without any acute cardiopulmonary process.  Patient is euvolemic on physical exam.  No concern for CHF exacerbation at this time. -Obtaining echocardiogram. - As blood pressure already has been decreased to 25% from baseline continue to decrease the blood pressure gradually over the course of 24 hours - Resuming Avapro 300 mg from tomorrow and continue Lopressor 25 mg twice daily from tonight.  Continue hydralazine 5 mg every 6 as needed for SBP above 170 or DBP above 110. -Cardiac monitoring  Grade 2 diastolic heart failure with preserved EF 60 to 65% History of hypertrophic cardiomyopathy Mitral regurgitation -Per chart review echo from 12/2016 showed  EF 60 to 65%, mitral regurgitation and grade 2 diastolic heart failure. - Elevated BNP 233.  Chest x-ray showed cardiomegaly without any acute cardiopulmonary process.  Patient is evolving on physical  exam.  There is no concern for CHF exacerbation at this time - Continue home medications include Toprol-XL 25 mg twice daily, Avapro 300 mg daily, Lasix 20 mg MWF schedule and holding home oral as needed hydralazine now instead doing IV hydralazine as needed. -Continue to monitor blood pressure. - Will follow-up with echocardiogram.  Paroxysmal atrial fibrillation -CHA2DS2VAc score 5.   -EKG showing atrial fibrillation rate controlled 75. -Continue amiodarone 200 mg Monday and Thursday same as home dose and Eliquis 5 mg twice daily.  Continue Lopressor 25 mg twice daily - Continue cardia monitoring  Rheumatoid arthritis - Per chart review of the rheumatology note patient is on methotrexate 15 mg once weekly.   Need to verify with pharmacy exactly which day patient takes methotrexate.  Patient unable to recall at the bedside.   DVT prophylaxis:  Eliquis Code Status:  Full Code.  Verified with patient at bedside Diet: Heart healthy diet. Family Communication:   Family was present at bedside, at the time of interview.  Opportunity was given to ask question and all questions were answered satisfactorily.  Disposition Plan: Pending echocardiogram and further medical decision based on the echo finding.  monitor improvement of blood pressure. Consults:  none Admission status:   Observation, cardiac-Telemetry bed  Severity of Illness: The appropriate patient status for this patient is OBSERVATION. Observation status is judged to be reasonable and necessary in order to provide the required intensity of service to ensure the patient's safety. The patient's presenting symptoms, physical exam findings, and initial radiographic and laboratory data in the context of their medical condition is felt to place them at decreased risk for further clinical deterioration. Furthermore, it is anticipated that the patient will be medically stable for discharge from the hospital within 2 midnights of admission.      Tereasa Coop, MD [facility]  How to contact the [facility] Attending or Consulting provider 7A - 7P or covering provider during after hours 7P -7A, for this patient.  Check the care team in [facility] and look for a) attending/consulting [facility] provider listed and b) the [facility] team listed Log into [email or web address] and use [place]'s universal password to access. If you do not have the password, please contact the hospital operator. Locate the [facility] provider you are looking for under [facility] and page to a number that you can be directly reached. If you still have difficulty reaching the provider, please page the [facility] (Director on Call) for the Hospitalists listed on amion for assistance.  05/29/2023, 2:09 AM

## 2023-05-29 NOTE — ED Notes (Signed)
Patient transported to ECHO.

## 2023-05-30 DIAGNOSIS — R0789 Other chest pain: Secondary | ICD-10-CM | POA: Diagnosis not present

## 2023-05-30 LAB — COMPREHENSIVE METABOLIC PANEL
ALT: 15 U/L (ref 0–44)
AST: 17 U/L (ref 15–41)
Albumin: 3.1 g/dL — ABNORMAL LOW (ref 3.5–5.0)
Alkaline Phosphatase: 57 U/L (ref 38–126)
Anion gap: 9 (ref 5–15)
BUN: 18 mg/dL (ref 8–23)
CO2: 26 mmol/L (ref 22–32)
Calcium: 9.6 mg/dL (ref 8.9–10.3)
Chloride: 104 mmol/L (ref 98–111)
Creatinine, Ser: 0.96 mg/dL (ref 0.44–1.00)
GFR, Estimated: 56 mL/min — ABNORMAL LOW (ref >60)
Glucose, Bld: 101 mg/dL — ABNORMAL HIGH (ref 70–99)
Potassium: 3.9 mmol/L (ref 3.5–5.1)
Sodium: 139 mmol/L (ref 135–145)
Total Bilirubin: 0.7 mg/dL (ref <1.2)
Total Protein: 5.7 g/dL — ABNORMAL LOW (ref 6.5–8.1)

## 2023-05-30 LAB — CBC
HCT: 41.3 % (ref 36.0–46.0)
Hemoglobin: 13 g/dL (ref 12.0–15.0)
MCH: 30.7 pg (ref 26.0–34.0)
MCHC: 31.5 g/dL (ref 30.0–36.0)
MCV: 97.4 fL (ref 80.0–100.0)
Platelets: 161 10*3/uL (ref 150–400)
RBC: 4.24 MIL/uL (ref 3.87–5.11)
RDW: 14 % (ref 11.5–15.5)
WBC: 6.5 10*3/uL (ref 4.0–10.5)
nRBC: 0 % (ref 0.0–0.2)

## 2023-05-30 NOTE — Progress Notes (Signed)
Mobility Specialist Progress Note:   05/30/23 1000  Mobility  Activity Ambulated with assistance in hallway;Ambulated with assistance to bathroom  Level of Assistance Contact guard assist, steadying assist  Assistive Device Front wheel walker  Distance Ambulated (ft) 250 ft  Activity Response Tolerated well  Mobility Referral Yes  $Mobility charge 1 Mobility  Mobility Specialist Start Time (ACUTE ONLY) 1006  Mobility Specialist Stop Time (ACUTE ONLY) 1016  Mobility Specialist Time Calculation (min) (ACUTE ONLY) 10 min    Pre Mobility: 76 HR,  96% SpO2 During Mobility: 85 HR,  94% SpO2 Post Mobility:  73 HR,  93% SpO2  Pt received on EOB, requesting assistance to BR. Void successful. Asymptomatic w/ no complaints throughout hallway ambulation. Returned to room w/o fault. Pt left ambulating in room w/ call bell near.  [place] Earlene Plater Mobility Specialist Please contact via Special educational needs teacher or Rehab office at [phone]

## 2023-05-30 NOTE — Care Management CC44 (Signed)
Condition Code 44 Documentation Completed  Patient Details  Name: Stacy [name] MRN: [id number] Date of Birth: 09/15/1928   Condition Code 44 given:  Yes Patient signature on Condition Code 44 notice:  Yes Documentation of 2 MD's agreement:  Yes Code 44 added to claim:  Yes    Harriet Masson, RN 05/30/2023, 11:27 AM

## 2023-05-30 NOTE — Care Management Obs Status (Signed)
MEDICARE OBSERVATION STATUS NOTIFICATION   Patient Details  Name: DEVANI RAIN MRN: [id number] Date of Birth: September 20, 1928   Medicare Observation Status Notification Given:  Yes    Harriet Masson, RN 05/30/2023, 11:27 AM

## 2023-05-30 NOTE — Discharge Summary (Signed)
Physician Discharge Summary  Stacy [name] BJS:[id number] DOB: 05-31-1929 DOA: 05/28/2023  PCP: Stacy Fusi, MD  Admit date: 05/28/2023  Discharge date: 05/30/2023  Admitted From: Home.  Disposition:  Home.  Recommendations for Outpatient Follow-up:  Follow up with PCP in 1-2 weeks. Please obtain BMP/CBC in one week Advised to follow-up with cardiology Dr. Oswaldo [name], Stacy [name] in 1 week.  Home Health: None  Equipment / Devices:None  Discharge Condition: Stable CODE STATUS: Full code Diet recommendation: Heart Healthy  Brief Summary/ Hospital Course: This 89 y.o. female with medical history significant of hypertrophic cardiomyopathy, hyperlipidemia, heart failure with preserved ejection fraction 60 to 65%, paroxysmal atrial fibrillation on Eliquis, Essential hypertension and mitral regurgitation presented in the ED for evaluation for chest pain. Patient reports she was in her bed and suddenly started chest pain on the left side radiating towards left arm and associated with shortness of breath.  Chest pain lasted for 15 to 30 minutes and then resolved.  Of note patient takes amiodarone 200 mg on Monday and Thursdays and Eliquis 5 mg twice daily for A-fib.  In the ED workup reveals BNP 233, troponin x 2 negative.  EKG shows A-fib with normal ventricular rate.  Patient has been admitted for further evaluation.  Chest pain seems secondary to hypertensive urgency.  Blood pressure is now improved.  Patient reports chest pain has resolved.  Echocardiogram shows LVEF 60 to 65%,Findings suggestive of HCM without LVOT obstruction.  Patient feels better, wants to be discharged.  Patient is being discharged home. Advised to follow-up with Dr. Lindajo [name] in 1 week.  Patient being discharged home.  Discharge Diagnoses:  Principal Problem:   Chest pain Active Problems:   History of hypertrophic cardiomyopathy   Heart failure with preserved ejection fraction (HCC)   Hypertensive urgency    Hyperlipidemia   Rheumatoid arthritis (HCC)   Mitral regurgitation   Chronic diastolic CHF (congestive heart failure) (HCC)   Paroxysmal atrial fibrillation [facility])  Discharge Instructions  Discharge Instructions     Call MD for:  difficulty breathing, headache or visual disturbances   Complete by: As directed    Call MD for:  persistant dizziness or light-headedness   Complete by: As directed    Call MD for:  persistant nausea and vomiting   Complete by: As directed    Diet - low sodium heart healthy   Complete by: As directed    Diet Carb Modified   Complete by: As directed    Discharge instructions   Complete by: As directed    Advised to follow-up with primary care physician in 1 week. Advised to follow-up with cardiology Stacy [name] in 1 week.   Increase activity slowly   Complete by: As directed       Allergies as of 05/30/2023       Reactions   Sulfa Antibiotics Nausea Only        Medication List     STOP taking these medications    furosemide 20 MG tablet Commonly known as: LASIX       TAKE these medications    acetaminophen 325 MG tablet Commonly known as: TYLENOL Take 650 mg by mouth every 6 (six) hours as needed for moderate pain or headache.   albuterol 108 (90 Base) MCG/ACT inhaler Commonly known as: VENTOLIN HFA Inhale 2 puffs into the lungs every 6 (six) hours as needed for wheezing or shortness of breath.   amiodarone 200 MG tablet Commonly known as: PACERONE Take one tablet  by mouth on Monday and Thursday   atorvastatin 10 MG tablet Commonly known as: LIPITOR Take 10 mg by mouth at bedtime.   BIOTIN PO Take 1 capsule by mouth daily.   Eliquis 5 MG Tabs tablet Generic drug: apixaban TAKE ONE TABLET BY MOUTH TWICE DAILY   folic acid 1 MG tablet Commonly known as: FOLVITE Take 3 mg by mouth daily.   hydrALAZINE 25 MG tablet Commonly known as: APRESOLINE Take 1 tablet 3 times daily if BP >140 What changed:  how much to  take how to take this when to take this reasons to take this additional instructions   irbesartan 300 MG tablet Commonly known as: AVAPRO Take 1 tablet (300 mg total) by mouth daily. What changed: when to take this   methotrexate 2.5 MG tablet Commonly known as: RHEUMATREX Take 15 mg by mouth every Saturday.   metoprolol tartrate 25 MG tablet Commonly known as: LOPRESSOR Take 1 tablet (25 mg total) by mouth 2 (two) times daily.   REFRESH OP Place 1 drop into both eyes as needed (dry eyes).   vitamin C 1000 MG tablet Take 1,000 mg by mouth daily.   VITAMIN D-3 PO Take 1 tablet by mouth daily.   zinc gluconate 50 MG tablet Take 50 mg by mouth daily.        Follow-up Information     Stacy Fusi, MD Follow up in 1 week(s).   Specialty: Internal Medicine Contact information: [address] [place] [address] [phone]         Nahser, [name], MD Follow up in 1 week(s).   Specialty: Cardiology Contact information: [address] [place] [address] [phone]                Allergies  Allergen Reactions   Sulfa Antibiotics Nausea Only    Consultations: None   Procedures/Studies: ECHOCARDIOGRAM COMPLETE  Result Date: 05/29/2023    ECHOCARDIOGRAM REPORT   Patient Name:   Stacy [name] Date of Exam: 05/29/2023 Medical Rec #:  [id number]        Height:       60.0 in Accession #:    [id number]       Weight:       138.0 lb Date of Birth:  1929/04/18        BSA:          1.594 m Patient Age:    89 years         BP:           154/81 mmHg Patient Gender: F                HR:           60 bpm. Exam Location:  Inpatient Procedure: 2D Echo, Cardiac Doppler and Color Doppler Indications:    Chest Pain  History:        Patient has prior history of Echocardiogram examinations, most                 recent 11/19/2021. Arrythmias:Atrial Fibrillation.  Sonographer:    Darlys Gales Referring Phys: [id number] Stacy [name]  IMPRESSIONS  1. Findings suggestive of HCM without LVOT obstruction. Left ventricular ejection fraction, by estimation, is 60 to 65%. The left ventricle has normal function. The left ventricle has no regional wall motion abnormalities. There is severe asymmetric left ventricular hypertrophy of the basal-septal segment. Left ventricular diastolic function could not be evaluated.  2. Right ventricular systolic function is normal. The right ventricular size is normal. There is normal pulmonary artery systolic pressure. The estimated right ventricular systolic pressure is 22.6 mmHg.  3. Left atrial size was severely dilated.  4. The mitral valve is abnormal. Mild mitral valve regurgitation.  5. The aortic valve is tricuspid. Aortic valve regurgitation is trivial. Aortic valve sclerosis is present, with no evidence of aortic valve stenosis. Aortic regurgitation PHT measures 699 msec. Aortic valve mean gradient measures 5.0 mmHg.  6. The inferior vena cava is dilated in size with >50% respiratory variability, suggesting right atrial pressure of 8 mmHg. Comparison(s): Changes from prior study are noted. 11/19/2021: LVEF 60-65%, severe LAE. FINDINGS  Left Ventricle: Findings suggestive of HCM without LVOT obstruction. Left ventricular ejection fraction, by estimation, is 60 to 65%. The left ventricle has normal function. The left ventricle has no regional wall motion abnormalities. The left ventricular internal cavity size was normal in size. There is severe asymmetric left ventricular hypertrophy of the basal-septal segment. Left ventricular diastolic function could not be evaluated due to atrial fibrillation. Left ventricular diastolic function could not be evaluated. Right Ventricle: The right ventricular size is normal. No increase in right ventricular wall thickness. Right ventricular systolic function is normal. There is normal pulmonary artery systolic pressure. The tricuspid regurgitant velocity is 1.91 m/s, and  with  an assumed right atrial pressure of 8 mmHg, the estimated right ventricular systolic pressure is 22.6 mmHg. Left Atrium: Left atrial size was severely dilated. Right Atrium: Right atrial size was normal in size. Pericardium: There is no evidence of pericardial effusion. Mitral Valve: The mitral valve is abnormal. Mild mitral annular calcification. Mild mitral valve regurgitation. MV peak gradient, 12.0 mmHg. The mean mitral valve gradient is 4.0 mmHg. Tricuspid Valve: The tricuspid valve is grossly normal. Tricuspid valve regurgitation is trivial. Aortic Valve: The aortic valve is tricuspid. Aortic valve regurgitation is trivial. Aortic regurgitation PHT measures 699 msec. Aortic valve sclerosis is present, with no evidence of aortic valve stenosis. Aortic valve mean gradient measures 5.0 mmHg. Aortic valve peak gradient measures 7.6 mmHg. Aortic valve area, by VTI measures 2.46 cm. Pulmonic Valve: The pulmonic valve was grossly normal. Pulmonic valve regurgitation is trivial. Aorta: The aortic root and ascending aorta are structurally normal, with no evidence of dilitation. Venous: The inferior vena cava is dilated in size with greater than 50% respiratory variability, suggesting right atrial pressure of 8 mmHg. IAS/Shunts: No atrial level shunt detected by color flow Doppler.  LEFT VENTRICLE PLAX 2D LVIDd:         4.30 cm   Diastology LVIDs:         2.90 cm   LV e' medial:    4.68 cm/s LV PW:         0.90 cm   LV E/e' medial:  36.8 LV IVS:        1.10 cm   LV e' lateral:   4.03 cm/s LVOT diam:     1.70 cm   LV E/e' lateral: 42.7 LV SV:         76 LV SV Index:   47 LVOT Area:     2.27 cm  RIGHT VENTRICLE RV S prime:     9.03 cm/s LEFT ATRIUM              Index        RIGHT ATRIUM           Index LA Vol (A2C):  105.0 ml 65.86 ml/m  RA Area:     15.10 cm LA Vol (A4C):   149.0 ml 93.46 ml/m  RA Volume:   30.00 ml  18.82 ml/m LA Biplane Vol: 123.0 ml 77.15 ml/m  AORTIC VALVE AV Area (Vmax):    2.51 cm AV Area  (Vmean):   2.21 cm AV Area (VTI):     2.46 cm AV Vmax:           138.00 cm/s AV Vmean:          111.000 cm/s AV VTI:            0.307 m AV Peak Grad:      7.6 mmHg AV Mean Grad:      5.0 mmHg LVOT Vmax:         152.67 cm/s LVOT Vmean:        108.000 cm/s LVOT VTI:          0.333 m LVOT/AV VTI ratio: 1.08 AI PHT:            699 msec  AORTA Ao Root diam: 3.20 cm MITRAL VALVE                TRICUSPID VALVE MV Area (PHT): 2.90 cm     TR Peak grad:   14.6 mmHg MV Area VTI:   1.74 cm     TR Vmax:        191.00 cm/s MV Peak grad:  12.0 mmHg MV Mean grad:  4.0 mmHg     SHUNTS MV Vmax:       1.73 m/s     Systemic VTI:  0.33 m MV Vmean:      90.5 cm/s    Systemic Diam: 1.70 cm MV Decel Time: 262 msec MV E velocity: 172.00 cm/s Zoila Shutter MD Electronically signed by Zoila Shutter MD Signature Date/Time: 05/29/2023/11:56:35 AM    Final    DG Chest 2 View  Result Date: 05/28/2023 CLINICAL DATA:  Chest pain EXAM: CHEST - 2 VIEW COMPARISON:  Chest x-ray 09/18/2022 FINDINGS: The aorta is tortuous, unchanged. The heart is enlarged. The lungs are clear. There is no pleural effusion or pneumothorax. There is a small band of atelectasis in the right lower lung. No acute fractures are identified. IMPRESSION: 1. Cardiomegaly. 2. No acute cardiopulmonary process. Electronically Signed   By: Darliss Cheney M.D.   On: 05/28/2023 23:01     Subjective: Patient was seen and examined at bedside.  Overnight events noted.   Patient reports doing much better and wants to be discharged.  Patient is being discharged home.  Discharge Exam: Vitals:   05/30/23 0732 05/30/23 0920  BP: (!) 156/77 (!) 155/94  Pulse:  66  Resp:    Temp: 97.8 F (36.6 C)   SpO2: 97%    Vitals:   05/30/23 0228 05/30/23 0443 05/30/23 0732 05/30/23 0920  BP: (!) 182/88  (!) 156/77 (!) 155/94  Pulse: 64   66  Resp: 18     Temp: 98 F (36.7 C)  97.8 F (36.6 C)   TempSrc: Oral  Oral   SpO2: 98%  97%   Weight:  61.6 kg    Height:         General: Pt is alert, awake, not in acute distress Cardiovascular: RRR, S1/S2 +, no rubs, no gallops Respiratory: CTA bilaterally, no wheezing, no rhonchi Abdominal: Soft, NT, ND, bowel sounds + Extremities: no edema, no cyanosis    The results of significant  diagnostics from this hospitalization (including imaging, microbiology, ancillary and laboratory) are listed below for reference.     Microbiology: Recent Results (from the past 240 hour(s))  MRSA Next Gen by PCR, Nasal     Status: None   Collection Time: 05/29/23  9:29 PM   Specimen: Nasal Mucosa; Nasal Swab  Result Value Ref Range Status   MRSA by PCR Next Gen NOT DETECTED NOT DETECTED Final    Comment: (NOTE) The GeneXpert MRSA Assay (FDA approved for NASAL specimens only), is one component of a comprehensive MRSA colonization surveillance program. It is not intended to diagnose MRSA infection nor to guide or monitor treatment for MRSA infections. Test performance is not FDA approved in patients less than 86 years old. Performed at [facility], [address] [place], [place] [address]      Labs: BNP (last 3 results) Recent Labs    05/28/23 2231  BNP 233.0*   Basic Metabolic Panel: Recent Labs  Lab 05/28/23 2231 05/29/23 0223 05/30/23 0224  NA 138 141 139  K 3.6 3.6 3.9  CL 104 107 104  CO2 24 27 26   GLUCOSE 97 101* 101*  BUN 15 12 18   CREATININE 0.80 0.73 0.96  CALCIUM 9.1 9.2 9.6  MG 2.3  --   --    Liver Function Tests: Recent Labs  Lab 05/29/23 0223 05/30/23 0224  AST 18 17  ALT 16 15  ALKPHOS 62 57  BILITOT 0.7 0.7  PROT 5.9* 5.7*  ALBUMIN 3.4* 3.1*   No results for input(s): "LIPASE", "AMYLASE" in the last 168 hours. No results for input(s): "AMMONIA" in the last 168 hours. CBC: Recent Labs  Lab 05/28/23 2231 05/29/23 0223 05/30/23 0224  WBC 6.7 6.5 6.5  HGB 13.5 12.8 13.0  HCT 42.2 40.2 41.3  MCV 98.1 97.3 97.4  PLT 159 164 161   Cardiac Enzymes: No results  for input(s): "CKTOTAL", "CKMB", "CKMBINDEX", "TROPONINI" in the last 168 hours. BNP: Invalid input(s): "POCBNP" CBG: No results for input(s): "GLUCAP" in the last 168 hours. D-Dimer No results for input(s): "DDIMER" in the last 72 hours. Hgb A1c No results for input(s): "HGBA1C" in the last 72 hours. Lipid Profile No results for input(s): "CHOL", "HDL", "LDLCALC", "TRIG", "CHOLHDL", "LDLDIRECT" in the last 72 hours. Thyroid function studies No results for input(s): "TSH", "T4TOTAL", "T3FREE", "THYROIDAB" in the last 72 hours.  Invalid input(s): "FREET3" Anemia work up No results for input(s): "VITAMINB12", "FOLATE", "FERRITIN", "TIBC", "IRON", "RETICCTPCT" in the last 72 hours. Urinalysis    Component Value Date/Time   COLORURINE YELLOW 09/26/2022 2002   APPEARANCEUR CLEAR 09/26/2022 2002   LABSPEC 1.005 09/26/2022 2002   PHURINE 6.0 09/26/2022 2002   GLUCOSEU NEGATIVE 09/26/2022 2002   HGBUR NEGATIVE 09/26/2022 2002   BILIRUBINUR NEGATIVE 09/26/2022 2002   KETONESUR NEGATIVE 09/26/2022 2002   PROTEINUR NEGATIVE 09/26/2022 2002   NITRITE NEGATIVE 09/26/2022 2002   LEUKOCYTESUR NEGATIVE 09/26/2022 2002   Sepsis Labs Recent Labs  Lab 05/28/23 2231 05/29/23 0223 05/30/23 0224  WBC 6.7 6.5 6.5   Microbiology Recent Results (from the past 240 hour(s))  MRSA Next Gen by PCR, Nasal     Status: None   Collection Time: 05/29/23  9:29 PM   Specimen: Nasal Mucosa; Nasal Swab  Result Value Ref Range Status   MRSA by PCR Next Gen NOT DETECTED NOT DETECTED Final    Comment: (NOTE) The GeneXpert MRSA Assay (FDA approved for NASAL specimens only), is one component of a  comprehensive MRSA colonization surveillance program. It is not intended to diagnose MRSA infection nor to guide or monitor treatment for MRSA infections. Test performance is not FDA approved in patients less than 71 years old. Performed at [facility], [address], [place], [place] [address]       Time coordinating discharge: Over 30 minutes  SIGNED:   Willeen Niece, MD  [facility] 05/30/2023, 11:24 AM Pager   If 7PM-7AM, please contact night-coverage

## 2023-05-30 NOTE — Discharge Instructions (Signed)
Advised to follow-up with primary care physician in 1 week. Advised to follow-up with cardiology as scheduled.

## 2023-05-30 NOTE — TOC Transition Note (Signed)
Transition of Care [facility]) - CM/SW Discharge Note   Patient Details  Name: Stacy [name] MRN: [id number] Date of Birth: October 13, 1928  [facility] of [facility]) CM/SW Contact:  Harriet Masson, RN Phone Number: 05/30/2023, 12:04 PM   Clinical Narrative:    Patient stable for discharge.  Patient lives with son and grandson. No TOC needs at this time.    Final next level of care: Home/Self Care Barriers to Discharge: Barriers Resolved   Patient Goals and CMS Choice    Return home  Discharge Placement                 home        Discharge Plan and Services Additional resources added to the After Visit Summary for                                       Social Determinants of Health (SDOH) Interventions SDOH Screenings   Food Insecurity: No Food Insecurity (05/29/2023)  Housing: Patient Declined (05/29/2023)  Transportation Needs: No Transportation Needs (05/29/2023)  Utilities: Not At Risk (05/29/2023)  Tobacco Use: Low Risk  (05/29/2023)     Readmission Risk Interventions     No data to display

## 2023-06-03 ENCOUNTER — Ambulatory Visit: Payer: Medicare Other | Admitting: Cardiovascular Disease

## 2023-06-03 DIAGNOSIS — I10 Essential (primary) hypertension: Secondary | ICD-10-CM | POA: Diagnosis not present

## 2023-06-03 DIAGNOSIS — R079 Chest pain, unspecified: Secondary | ICD-10-CM | POA: Diagnosis not present

## 2023-06-03 DIAGNOSIS — E785 Hyperlipidemia, unspecified: Secondary | ICD-10-CM | POA: Diagnosis not present

## 2023-06-03 DIAGNOSIS — I5032 Chronic diastolic (congestive) heart failure: Secondary | ICD-10-CM | POA: Diagnosis not present

## 2023-06-03 DIAGNOSIS — Z23 Encounter for immunization: Secondary | ICD-10-CM | POA: Diagnosis not present

## 2023-06-03 DIAGNOSIS — I482 Chronic atrial fibrillation, unspecified: Secondary | ICD-10-CM | POA: Diagnosis not present

## 2023-06-03 DIAGNOSIS — M069 Rheumatoid arthritis, unspecified: Secondary | ICD-10-CM | POA: Diagnosis not present

## 2023-06-03 DIAGNOSIS — I340 Nonrheumatic mitral (valve) insufficiency: Secondary | ICD-10-CM | POA: Diagnosis not present

## 2023-06-03 DIAGNOSIS — I5030 Unspecified diastolic (congestive) heart failure: Secondary | ICD-10-CM | POA: Diagnosis not present

## 2023-06-03 DIAGNOSIS — I421 Obstructive hypertrophic cardiomyopathy: Secondary | ICD-10-CM | POA: Diagnosis not present

## 2023-06-08 DIAGNOSIS — M19011 Primary osteoarthritis, right shoulder: Secondary | ICD-10-CM | POA: Diagnosis not present

## 2023-06-08 DIAGNOSIS — M19012 Primary osteoarthritis, left shoulder: Secondary | ICD-10-CM | POA: Diagnosis not present

## 2023-06-08 NOTE — Progress Notes (Signed)
Stacy [name] Date of Birth  01-Nov-1928 [facility]      [address]    [address]    [place], [place]  [address]       Problems: 1. 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 Hyperlipidemia 3. Mitral regurgitation 4. Paroxysmal atrial fib (diagnosed Feb. 2018 )  CHads2VASc 5 ( female  age 89, HTN, diastolic CHF)     previous notes   Stacy [name] is done very well from a cardiac standpoint. She's not had any episodes of chest pain or shortness of breath. She's had a cough and bronchitis for the past several weeks. She denies any syncope or presyncope. She denies any chest pain.  Her blood pressure has been well-controlled on. She's not had any problems.   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  April 10 , 2014:  No chest pain.  No dyspnea.  BP has been ok.  She gets some exercise - not as much as she should - also has orthopedic issues that limit her.   Sept. 15, 2014:  She has a mild dynamic  LVOT obstruction.   We increased her metoprolol at the last visit and she is doing well.  No dizziness.  She does have some vertigo and takes meclizine.  Sept. 17, 2015: Doing well.  No CP or dyspnea.   Exercising regularly.  Sept. 23, 2016:  Doing well from a cardiac standpoint Had an episode of vertigo a week ago .  Lasted for several minutes.  Did not go to the ER.  Symptoms have resolved completely   October 14, 2015: Stacy [name] has a dynamic LVOT obstruction that we are managing with metoprolol.   Her HR has been a bit slow She is off the  HCTZ .   i've advised her to avoid being volume depleted.  Has had some dizziness and headaches recently  Taking metoprolol 50 mg in the am , 25 mg in the evening   January 03, 2017:  Stacy [name] is seen today for follow up visit She was found to have Atrial fib with RVR ( FEb. 21-23 hospitalization) and has been started on Eliquis  She has converted back to NSR / sinus  bradycardia  Echo shows normal LV systolic function, grade 2 diastolic dysfunction, mild AS   Has not been taking her Eliquis - having occasional bloody nose. Has some bleeding under her right great toe.  BP is typically better at home   September 26, 2017   Stacy [name] is an 89 year old female with a history of paroxysmal atrial fibrillation, hypertrophic obstructive cardiomyopathy, mitral regurgitation, hyperlipidemia and diastolic heart failure.  She was seen in October, 2018 by Tereso Newcomer, PA.  Avoids salt.  Has some dyspnea.  Able to work out regularly .   Has some DOE with exercise  Has not taken her meds - BP is a bit elevated today  She was on Lasix and kdur.   These were stopped by Dr. Waynard Edwards  -possibly because of her dynamic outflow tract obstruction. Has some leg swelling    January 02, 2018:  Stacy [name] is seen back today for follow-up of her chronic diastolic congestive heart failure and her LVOT obstruction.  We added Lasix 20 mg 3 times a week as well as potassium chloride 10 mEq 3 times a week. She is feeling much better at this point.   No syncope . Getting regular exercise  April 03, 2018:  Stacy [name] is seen back today for follow-up visit.  She has had some volume overload.  She has normal left ventricular systolic function.  In the past we have been concerned about dynamic LVOT obstruction but recent echocardiograms have not demonstrated a significant LVOT obstruction.  She feels fine.  She is part of a program that has home health nurses monitor   her weight.  She recently gained several pounds in 1 day and we  were alerted.  She takes Lasix 20 mg 3 days a week but notes that she really does not get much urine output today after taking the Lasix 20 mg tablets. She is not short of breath.  She has not had any leg edema.  She really does not feel any different today compared to last week with a week before.  Dec. 16, 2019: Stacy [name] is seen back today for work in visit.   She was recently seen by her primary medical doctor and was markedly hypertensive. She did not eat any extra salt. He increased the Lasix and Kdur to 5 times a week . Now her BP readings are great.  shes tolerating the increased meds.   Sept. 11, 2020  Stacy [name] is seen today for follow up of her PAF, HTN, HOCM .  Wakes up with headaches  BP looks great.  Stays active.  Works in her yard, garden, housework  No syncope,   September 27, 2019:  22 seen today for follow-up of her paroxysmal atrial fibrillation, hypertension mild hypertrophic obstructive cardiomyopathy. No CP ,  Occasional dyspnea.  No syncope  Walks , works in her garden regularly ,  Does her own housework   Sept, 9, 2021: Stacy [name] is seen today for follow up of her PAF, HTN, mild HOCM. She checks her BP daily , is frequently a bit elevate.  Avoids salt   Was hospitalized a month ago.   Had syncope due to a UTI . Feeling great   Jan. 4, 2022: Stacy [name] is seen today for evaluation of worsening dyspnea for the past month.  HR is slow today . She has 1st degree AV block , she is on amio 100 mg a day, cardizem 180 mg d day , Metoprolol 50 mg in AM, 25 mg in PM .  She fell 2 times over the past several months.  No syncope,  Just lost her balance.    Aug. 19, 2022: Stacy [name] is seen today for follow up of her dyspnea, afib, HOCM Is more short of breath  Is not able to do her daily activities without dysnea   Having difficulty remembering details.    November 06, 2021:  Stacy [name] is seen today for follow-up of her atrial fibrillation, HOCM.  She had more shortness of breath at    Is dizzy when she changes position -  Occurs when she lies down in bed . Also has orthostasis when getting up out of bed Has hydralazine listed as an as needed medication but she has not taken that in quite some time.  She takes Lasix 20 mg a day on Mondays, Wednesdays, Fridays. Her symptoms are not necessarily worse on specific days but she has  these episodes every day.  Has talked to her primary md who did not think it was vertigo    Her dyspnea has worsened ,   Short of breath while sitting in chair No orthopnea  Echocardiogram from [facility] from November, 2022 shows hyperdynamic LV function.  She has systolic anterior motion of the mitral leaflet.  She has a dynamic outflow gradient of 37 mmHg.  Is worried about her dyspnea. Does not want to see pulmonary medicine   We discussed having her see Dr. Timoteo Gaul Her daughter, Cedric Fishman has HOCM and is seening Dr. Gildardo Griffes next month   Nov. 22, 2024  Jenascia is seen for follow up visit  She had a recent episode of CP , "Claw like " chest pressure  Could not breathe Last a few minutes  Troponins were normal  Echo on Nov. 10, 2024 is suggestive of HCM without HCM  Normal LV function    LV EF is 60-65% Severe asymetric LVH Severe LAE Mild MR    Wt Readings from Last 3 Encounters:  06/10/23 136 lb 9.6 oz (62 kg)  05/30/23 135 lb 12.9 oz (61.6 kg)  09/26/22 138 lb (62.6 kg)    Current Outpatient Medications on File Prior to Visit  Medication Sig Dispense Refill   acetaminophen (TYLENOL) 325 MG tablet Take 650 mg by mouth every 6 (six) hours as needed for moderate pain or headache.     albuterol (VENTOLIN HFA) 108 (90 Base) MCG/ACT inhaler Inhale 2 puffs into the lungs every 6 (six) hours as needed for wheezing or shortness of breath.     amiodarone (PACERONE) 200 MG tablet Take one tablet by mouth on Monday and Thursday 30 tablet 3   Ascorbic Acid (VITAMIN C) 1000 MG tablet Take 1,000 mg by mouth daily.     atorvastatin (LIPITOR) 10 MG tablet Take 10 mg by mouth at bedtime.     BIOTIN PO Take 1 capsule by mouth daily.     Cholecalciferol (VITAMIN D-3 PO) Take 1 tablet by mouth daily.     ELIQUIS 5 MG TABS tablet TAKE ONE TABLET BY MOUTH TWICE DAILY 180 tablet 1   folic acid (FOLVITE) 1 MG tablet Take 3 mg by mouth daily.     hydrALAZINE (APRESOLINE)  25 MG tablet Take 1 tablet 3 times daily if BP >140 (Patient taking differently: Take 25 mg by mouth daily as needed (for sBP >140).) 270 tablet 3   irbesartan (AVAPRO) 300 MG tablet Take 1 tablet (300 mg total) by mouth daily. 30 tablet 0   methotrexate 2.5 MG tablet Take 15 mg by mouth every Saturday.     metoprolol tartrate (LOPRESSOR) 25 MG tablet Take 1 tablet (25 mg total) by mouth 2 (two) times daily. 180 tablet 2   Polyvinyl Alcohol-Povidone (REFRESH OP) Place 1 drop into both eyes as needed (dry eyes).     zinc gluconate 50 MG tablet Take 50 mg by mouth daily.     No current facility-administered medications on file prior to visit.    Allergies  Allergen Reactions   Sulfa Antibiotics Nausea Only    Past Medical History:  Diagnosis Date   Bowel obstruction (HCC)    Diastolic dysfunction    Dizziness    Gallbladder disease    GERD (gastroesophageal reflux disease)    HOCM (hypertrophic obstructive cardiomyopathy) (HCC) 09/27/2016   Echo 10/16: severe LVH, EF 55-60, peak 32, no RWMA, +SAM, mild to mod MR, severe LAE, PASP 28 // Echo 2/18: mod conc LVH, severe septal LVH c/w HCM, EF 60-65, peak LVOT 94, no RWMA, + SAM, severe MR, severe LAE, PASP 25 // Echo 6/18: severe conc LVH, EF 60-65, noRWMA, Gr 2 DD, peak LVOT gradient 20, mild aortic stenosis, mild AI, mild MR, severe LAE,  mild TR, PASP 33    Hypertension    Hypertrophic obstructive cardiomyopathy with diastolic heart failure (HCC)    LVH (left ventricular hypertrophy)    WITH ASYMMETRIC SEPTAL HYPERTROPHY   Mitral regurgitation 09/09/2016   Severe by echo 08/2016   Persistent atrial fibrillation (HCC) 09/09/2016   Rheumatoid arthritis (HCC)    RLS (restless legs syndrome)     Past Surgical History:  Procedure Laterality Date   ABDOMINAL HYSTERECTOMY     APPENDECTOMY     CARDIOVASCULAR STRESS TEST  03/10/2006   EF 69%. NO EVIDENCE OF ISCHEMIA   CARDIOVERSION N/A 03/21/2018   Procedure: Cancelled Cardioversion;   Surgeon: Elease Hashimoto [name], MD;  Location: [facility] ENDOSCOPY;  Service: Cardiovascular;  Laterality: N/A;   CATARACT EXTRACTION     CHOLECYSTECTOMY     TOTAL HIP ARTHROPLASTY     US ECHOCARDIOGRAPHY  02/21/2008   EF 55-60%   US ECHOCARDIOGRAPHY  07/21/2005   EF 55-60%    Social History   Tobacco Use  Smoking Status Never  Smokeless Tobacco Never    Social History   Substance and Sexual Activity  Alcohol Use No    Family History  Problem Relation Age of Onset   Hypertension Mother    Stroke Mother    Melanoma Mother    Leukemia Father    Stroke Sister    Hypertension Sister    Alcohol abuse Brother    Heart attack Brother    Breast cancer Daughter     Reviw of Systems:  Reviewed in the HPI.  All other systems are negative.  Physical Exam: Blood pressure (!) 155/80, pulse 75, height 5' (1.524 m), weight 136 lb 9.6 oz (62 kg), SpO2 98%.  HYPERTENSION CONTROL Vitals:   06/10/23 0917 06/10/23 0929  BP: (!) 168/96 (!) 155/80    The patient's blood pressure is elevated above target today.  In order to address the patient's elevated BP: A new medication was prescribed today.       GEN:  Well nourished, well developed in no acute distress HEENT: Normal NECK: No JVD; No carotid bruits LYMPHATICS: No lymphadenopathy CARDIAC: RRR , no murmurs, rubs, gallops RESPIRATORY:  Clear to auscultation without rales, wheezing or rhonchi  ABDOMEN: Soft, non-tender, non-distended MUSCULOSKELETAL:  No edema; No deformity  SKIN: Warm and dry NEUROLOGIC:  Alert and oriented x 3   ECG:    Assessment / Plan:   1. Paroxysmal atrial fib : CHADS2VASC score 5  ( female, age , HTN, CHF)     2.  HOCM:  no evidence of LVOT obstruction .  No murmur   2. Hyperlipidemia  -  stable    3. Mitral regurgitation-    stable    4.  Essential hypertension:     BP is elevated . Will add amlodipine 2.5 mg a day .  Follw up with APP or me in 3 months    Kristeen Miss, MD  06/10/2023  9:35 AM    [facility] [address] [place],  [address] [place], [place]  [address] Pager [phone] Phone: [phone]; Fax: [phone]

## 2023-06-10 ENCOUNTER — Encounter: Payer: Self-pay | Admitting: Cardiovascular Disease

## 2023-06-10 ENCOUNTER — Ambulatory Visit: Payer: Medicare Other | Attending: Cardiovascular Disease | Admitting: Cardiovascular Disease

## 2023-06-10 VITALS — BP 155/80 | HR 75 | Ht 60.0 in | Wt 136.6 lb

## 2023-06-10 DIAGNOSIS — I10 Essential (primary) hypertension: Secondary | ICD-10-CM | POA: Diagnosis not present

## 2023-06-10 MED ORDER — AMLODIPINE BESYLATE 2.5 MG PO TABS
2.5000 mg | ORAL_TABLET | Freq: Every day | ORAL | 3 refills | Status: AC
Start: 2023-06-10 — End: ?

## 2023-06-10 NOTE — Patient Instructions (Signed)
Medication Instructions:  START Amlodipine 2.5mg  daily *If you need a refill on your cardiac medications before your next appointment, please call your pharmacy*  Follow-Up: At [facility], you and your health needs are our priority.  As part of our continuing mission to provide you with exceptional heart care, we have created designated Provider Care Teams.  These Care Teams include your primary Cardiologist (physician) and Advanced Practice Providers (APPs -  Physician Assistants and Nurse Practitioners) who all work together to provide you with the care you need, when you need it.  Your next appointment:   3 month(s)  Provider:   Kristeen Miss, MD

## 2023-07-02 ENCOUNTER — Other Ambulatory Visit: Payer: Self-pay | Admitting: Cardiovascular Disease

## 2023-07-06 DIAGNOSIS — M25512 Pain in left shoulder: Secondary | ICD-10-CM | POA: Diagnosis not present

## 2023-07-06 DIAGNOSIS — M25511 Pain in right shoulder: Secondary | ICD-10-CM | POA: Diagnosis not present

## 2023-08-23 ENCOUNTER — Other Ambulatory Visit: Payer: Self-pay | Admitting: Cardiovascular Disease

## 2023-09-09 DIAGNOSIS — I421 Obstructive hypertrophic cardiomyopathy: Secondary | ICD-10-CM | POA: Diagnosis not present

## 2023-09-09 DIAGNOSIS — E785 Hyperlipidemia, unspecified: Secondary | ICD-10-CM | POA: Diagnosis not present

## 2023-09-09 DIAGNOSIS — I5032 Chronic diastolic (congestive) heart failure: Secondary | ICD-10-CM | POA: Diagnosis not present

## 2023-09-09 DIAGNOSIS — Z79899 Other long term (current) drug therapy: Secondary | ICD-10-CM | POA: Diagnosis not present

## 2023-09-09 DIAGNOSIS — I340 Nonrheumatic mitral (valve) insufficiency: Secondary | ICD-10-CM | POA: Diagnosis not present

## 2023-09-09 DIAGNOSIS — M069 Rheumatoid arthritis, unspecified: Secondary | ICD-10-CM | POA: Diagnosis not present

## 2023-09-09 DIAGNOSIS — I5030 Unspecified diastolic (congestive) heart failure: Secondary | ICD-10-CM | POA: Diagnosis not present

## 2023-09-09 DIAGNOSIS — I10 Essential (primary) hypertension: Secondary | ICD-10-CM | POA: Diagnosis not present

## 2023-09-09 DIAGNOSIS — I482 Chronic atrial fibrillation, unspecified: Secondary | ICD-10-CM | POA: Diagnosis not present

## 2023-09-16 ENCOUNTER — Ambulatory Visit: Payer: Medicare Other | Admitting: Cardiovascular Disease

## 2023-09-18 ENCOUNTER — Encounter: Payer: Self-pay | Admitting: Cardiovascular Disease

## 2023-09-18 NOTE — Progress Notes (Unsigned)
 Stacy [name] Date of Birth  March 18, 1929 [facility]      [address]    [address]    [place], [place]  [address]       Problems: 1. 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 Hyperlipidemia 3. Mitral regurgitation 4. Paroxysmal atrial fib (diagnosed Feb. 2018 )  CHads2VASc 5 ( female  age 89, HTN, diastolic CHF)     previous notes   Stacy [name] is done very well from a cardiac standpoint. She's not had any episodes of chest pain or shortness of breath. She's had a cough and bronchitis for the past several weeks. She denies any syncope or presyncope. She denies any chest pain.  Her blood pressure has been well-controlled on. She's not had any problems.   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  April 10 , 2014:  No chest pain.  No dyspnea.  BP has been ok.  She gets some exercise - not as much as she should - also has orthopedic issues that limit her.   Sept. 15, 2014:  She has a mild dynamic  LVOT obstruction.   We increased her metoprolol at the last visit and she is doing well.  No dizziness.  She does have some vertigo and takes meclizine.  Sept. 17, 2015: Doing well.  No CP or dyspnea.   Exercising regularly.  Sept. 23, 2016:  Doing well from a cardiac standpoint Had an episode of vertigo a week ago .  Lasted for several minutes.  Did not go to the ER.  Symptoms have resolved completely   October 14, 2015: Stacy [name] has a dynamic LVOT obstruction that we are managing with metoprolol.   Her HR has been a bit slow She is off the  HCTZ .   i've advised her to avoid being volume depleted.  Has had some dizziness and headaches recently  Taking metoprolol 50 mg in the am , 25 mg in the evening   January 03, 2017:  [name] is seen today for follow up visit She was found to have Atrial fib with RVR ( FEb. 21-23 hospitalization) and has been started on Eliquis  She has converted back to NSR / sinus  bradycardia  Echo shows normal LV systolic function, grade 2 diastolic dysfunction, mild AS   Has not been taking her Eliquis - having occasional bloody nose. Has some bleeding under her right great toe.  BP is typically better at home   September 26, 2017   Stacy [name] is an 89 year old female with a history of paroxysmal atrial fibrillation, hypertrophic obstructive cardiomyopathy, mitral regurgitation, hyperlipidemia and diastolic heart failure.  She was seen in October, 2018 by Tereso Newcomer, PA.  Avoids salt.  Has some dyspnea.  Able to work out regularly .   Has some DOE with exercise  Has not taken her meds - BP is a bit elevated today  She was on Lasix and kdur.   These were stopped by Dr. Waynard Edwards  -possibly because of her dynamic outflow tract obstruction. Has some leg swelling    January 02, 2018:  Stacy [name] is seen back today for follow-up of her chronic diastolic congestive heart failure and her LVOT obstruction.  We added Lasix 20 mg 3 times a week as well as potassium chloride 10 mEq 3 times a week. She is feeling much better at this point.   No syncope . Getting regular exercise  April 03, 2018:  [name] is seen back today for follow-up visit.  She has had some volume overload.  She has normal left ventricular systolic function.  In the past we have been concerned about dynamic LVOT obstruction but recent echocardiograms have not demonstrated a significant LVOT obstruction.  She feels fine.  She is part of a program that has home health nurses monitor   her weight.  She recently gained several pounds in 1 day and we  were alerted.  She takes Lasix 20 mg 3 days a week but notes that she really does not get much urine output today after taking the Lasix 20 mg tablets. She is not short of breath.  She has not had any leg edema.  She really does not feel any different today compared to last week with a week before.  Dec. 16, 2019: [name] is seen back today for work in visit.   She was recently seen by her primary medical doctor and was markedly hypertensive. She did not eat any extra salt. He increased the Lasix and Kdur to 5 times a week . Now her BP readings are great.  shes tolerating the increased meds.   Sept. 11, 2020  Stacy [name] is seen today for follow up of her PAF, HTN, HOCM .  Wakes up with headaches  BP looks great.  Stays active.  Works in her yard, garden, housework  No syncope,   September 27, 2019:  22 seen today for follow-up of her paroxysmal atrial fibrillation, hypertension mild hypertrophic obstructive cardiomyopathy. No CP ,  Occasional dyspnea.  No syncope  Walks , works in her garden regularly ,  Does her own housework   Sept, 9, 2021: Stacy [name] is seen today for follow up of her PAF, HTN, mild HOCM. She checks her BP daily , is frequently a bit elevate.  Avoids salt   Was hospitalized a month ago.   Had syncope due to a UTI . Feeling great   Jan. 4, 2022: Stacy [name] is seen today for evaluation of worsening dyspnea for the past month.  HR is slow today . She has 1st degree AV block , she is on amio 100 mg a day, cardizem 180 mg d day , Metoprolol 50 mg in AM, 25 mg in PM .  She fell 2 times over the past several months.  No syncope,  Just lost her balance.    Aug. 19, 2022: Stacy [name] is seen today for follow up of her dyspnea, afib, HOCM Is more short of breath  Is not able to do her daily activities without dysnea   Having difficulty remembering details.    November 06, 2021:  Stacy [name] is seen today for follow-up of her atrial fibrillation, HOCM.  She had more shortness of breath at    Is dizzy when she changes position -  Occurs when she lies down in bed . Also has orthostasis when getting up out of bed Has hydralazine listed as an as needed medication but she has not taken that in quite some time.  She takes Lasix 20 mg a day on Mondays, Wednesdays, Fridays. Her symptoms are not necessarily worse on specific days but she has  these episodes every day.  Has talked to her primary md who did not think it was vertigo    Her dyspnea has worsened ,   Short of breath while sitting in chair No orthopnea  Echocardiogram from [facility] from November, 2022 shows hyperdynamic LV function.  She has systolic anterior motion of the mitral leaflet.  She has a dynamic outflow gradient of 37 mmHg.  Is worried about her dyspnea. Does not want to see pulmonary medicine   We discussed having her see Dr. Timoteo Gaul Her daughter, Cedric Fishman has HOCM and is seening Dr. Gildardo Griffes next month   Nov. 22, 2024  Demaya is seen for follow up visit  She had a recent episode of CP , "Claw like " chest pressure  Could not breathe Last a few minutes  Troponins were normal  Echo on Nov. 10, 2024 is suggestive of HCM without HCM  Normal LV function    LV EF is 60-65% Severe asymetric LVH Severe LAE Mild MR   September 19, 2023 Lavaeh is seen for follow up of her asymmetric LVH No CP , no dyspnea  She has had frequent elevated BP readings  She has been taking her BP first thing in the morning - before her meds.       Wt Readings from Last 3 Encounters:  09/19/23 135 lb (61.2 kg)  06/10/23 136 lb 9.6 oz (62 kg)  05/30/23 135 lb 12.9 oz (61.6 kg)    Current Outpatient Medications on File Prior to Visit  Medication Sig Dispense Refill   acetaminophen (TYLENOL) 325 MG tablet Take 650 mg by mouth every 6 (six) hours as needed for moderate pain or headache.     albuterol (VENTOLIN HFA) 108 (90 Base) MCG/ACT inhaler Inhale 2 puffs into the lungs every 6 (six) hours as needed for wheezing or shortness of breath.     amiodarone (PACERONE) 200 MG tablet Take one tablet by mouth on Monday and Thursday 30 tablet 3   amLODipine (NORVASC) 2.5 MG tablet Take 1 tablet (2.5 mg total) by mouth daily. 90 tablet 3   Ascorbic Acid (VITAMIN C) 1000 MG tablet Take 1,000 mg by mouth daily.     atorvastatin (LIPITOR) 10 MG tablet Take 10  mg by mouth at bedtime.     BIOTIN PO Take 1 capsule by mouth daily.     Cholecalciferol (VITAMIN D-3 PO) Take 1 tablet by mouth daily.     ELIQUIS 5 MG TABS tablet TAKE ONE TABLET BY MOUTH TWICE DAILY 180 tablet 1   folic acid (FOLVITE) 1 MG tablet Take 3 mg by mouth daily.     hydrALAZINE (APRESOLINE) 25 MG tablet Take 1 tablet 3 times daily if BP >140 (Patient taking differently: Take 25 mg by mouth daily as needed (for sBP >140).) 270 tablet 3   irbesartan (AVAPRO) 300 MG tablet Take 1 tablet (300 mg total) by mouth daily. 90 tablet 3   methotrexate 2.5 MG tablet Take 15 mg by mouth every Saturday.     metoprolol tartrate (LOPRESSOR) 25 MG tablet TAKE ONE TABLET BY MOUTH TWICE DAILY 180 tablet 3   Polyvinyl Alcohol-Povidone (REFRESH OP) Place 1 drop into both eyes as needed (dry eyes).     zinc gluconate 50 MG tablet Take 50 mg by mouth daily.     No current facility-administered medications on file prior to visit.    Allergies  Allergen Reactions   Sulfa Antibiotics Nausea Only    Past Medical History:  Diagnosis Date   Bowel obstruction (HCC)    Diastolic dysfunction    Dizziness    Gallbladder disease    GERD (gastroesophageal reflux disease)    HOCM (hypertrophic obstructive cardiomyopathy) (HCC) 09/27/2016   Echo 10/16: severe LVH, EF 55-60, peak 32, no  RWMA, +SAM, mild to mod MR, severe LAE, PASP 28 // Echo 2/18: mod conc LVH, severe septal LVH c/w HCM, EF 60-65, peak LVOT 94, no RWMA, + SAM, severe MR, severe LAE, PASP 25 // Echo 6/18: severe conc LVH, EF 60-65, noRWMA, Gr 2 DD, peak LVOT gradient 20, mild aortic stenosis, mild AI, mild MR, severe LAE, mild TR, PASP 33    Hypertension    Hypertrophic obstructive cardiomyopathy with diastolic heart failure (HCC)    LVH (left ventricular hypertrophy)    WITH ASYMMETRIC SEPTAL HYPERTROPHY   Mitral regurgitation 09/09/2016   Severe by echo 08/2016   Persistent atrial fibrillation (HCC) 09/09/2016   Rheumatoid arthritis (HCC)     RLS (restless legs syndrome)     Past Surgical History:  Procedure Laterality Date   ABDOMINAL HYSTERECTOMY     APPENDECTOMY     CARDIOVASCULAR STRESS TEST  03/10/2006   EF 69%. NO EVIDENCE OF ISCHEMIA   CARDIOVERSION N/A 03/21/2018   Procedure: Cancelled Cardioversion;  Surgeon: Elease Hashimoto [name], MD;  Location[facility] ENDOSCOPY;  Service: Cardiovascular;  Laterality: N/A;   CATARACT EXTRACTION     CHOLECYSTECTOMY     TOTAL HIP ARTHROPLASTY     US ECHOCARDIOGRAPHY  02/21/2008   EF 55-60%   US ECHOCARDIOGRAPHY  07/21/2005   EF 55-60%    Social History   Tobacco Use  Smoking Status Never  Smokeless Tobacco Never    Social History   Substance and Sexual Activity  Alcohol Use No    Family History  Problem Relation Age of Onset   Hypertension Mother    Stroke Mother    Melanoma Mother    Leukemia Father    Stroke Sister    Hypertension Sister    Alcohol abuse Brother    Heart attack Brother    Breast cancer Daughter     Reviw of Systems:  Reviewed in the HPI.  All other systems are negative.    Physical Exam: Blood pressure 135/80, pulse 68, height 5' (1.524 m), weight 135 lb (61.2 kg).       GEN:  Well nourished, well developed in no acute distress HEENT: Normal NECK: No JVD; No carotid bruits LYMPHATICS: No lymphadenopathy CARDIAC: RRR , no murmurs, rubs, gallops RESPIRATORY:  Clear to auscultation without rales, wheezing or rhonchi  ABDOMEN: Soft, non-tender, non-distended MUSCULOSKELETAL:  No edema; No deformity  SKIN: Warm and dry NEUROLOGIC:  Alert and oriented x 3    ECG:          Assessment / Plan:   1. Paroxysmal atrial fib : CHADS2VASC score 5  ( female, age , HTN, CHF)  No recent episodes of PAF,  cont eliquis  Cont amiodarone 200 mg twice a week     2.  HOCM:  no evidence of LVOT obstruction .     2. Hyperlipidemia  -   stable    3. Mitral regurgitation-     stable    4.  Essential hypertension:       Blood pressure is  well-controlled when she is taking her medicines.  She has noticed that her blood pressure is very high but this is always first thing in the morning before she is taking her medicines.  Have encouraged her to not take her blood pressure until after she has had her medicines.  Kristeen Miss, MD  09/19/2023 11:42 AM    [facility] [address] [place],  [address] [place], [place]  [address]  Pager [phone] Phone: [phone]; Fax: [phone]

## 2023-09-19 ENCOUNTER — Encounter: Payer: Self-pay | Admitting: Cardiovascular Disease

## 2023-09-19 ENCOUNTER — Ambulatory Visit: Payer: Medicare Other | Attending: Cardiovascular Disease | Admitting: Cardiovascular Disease

## 2023-09-19 VITALS — BP 135/80 | HR 68 | Ht 60.0 in | Wt 135.0 lb

## 2023-09-19 DIAGNOSIS — R079 Chest pain, unspecified: Secondary | ICD-10-CM

## 2023-09-19 DIAGNOSIS — I10 Essential (primary) hypertension: Secondary | ICD-10-CM | POA: Diagnosis not present

## 2023-09-19 NOTE — Patient Instructions (Signed)

## 2023-10-05 DIAGNOSIS — R21 Rash and other nonspecific skin eruption: Secondary | ICD-10-CM | POA: Diagnosis not present

## 2023-10-05 DIAGNOSIS — M0609 Rheumatoid arthritis without rheumatoid factor, multiple sites: Secondary | ICD-10-CM | POA: Diagnosis not present

## 2023-10-05 DIAGNOSIS — M1991 Primary osteoarthritis, unspecified site: Secondary | ICD-10-CM | POA: Diagnosis not present

## 2023-10-05 DIAGNOSIS — M5030 Other cervical disc degeneration, unspecified cervical region: Secondary | ICD-10-CM | POA: Diagnosis not present

## 2023-10-05 DIAGNOSIS — M810 Age-related osteoporosis without current pathological fracture: Secondary | ICD-10-CM | POA: Diagnosis not present

## 2023-10-05 DIAGNOSIS — G629 Polyneuropathy, unspecified: Secondary | ICD-10-CM | POA: Diagnosis not present

## 2023-10-05 DIAGNOSIS — M5136 Other intervertebral disc degeneration, lumbar region with discogenic back pain only: Secondary | ICD-10-CM | POA: Diagnosis not present

## 2023-10-05 DIAGNOSIS — M25512 Pain in left shoulder: Secondary | ICD-10-CM | POA: Diagnosis not present

## 2023-10-05 DIAGNOSIS — M2559 Pain in other specified joint: Secondary | ICD-10-CM | POA: Diagnosis not present

## 2023-11-07 ENCOUNTER — Other Ambulatory Visit: Payer: Self-pay

## 2023-11-07 DIAGNOSIS — I480 Paroxysmal atrial fibrillation: Secondary | ICD-10-CM

## 2023-11-07 MED ORDER — APIXABAN 5 MG PO TABS: 5.0000 mg | ORAL_TABLET | Freq: Two times a day (BID) | ORAL | 1 refills | Status: DC

## 2023-11-07 NOTE — Telephone Encounter (Signed)
 Prescription refill request for Eliquis  received. Indication: Afib  Last office visit: 09/19/23 ([facility])  Scr: 0.96 (05/30/23)  Age: 89 Weight: 61.2kg  Appropriate dose. Refill sent.

## 2023-12-02 ENCOUNTER — Telehealth: Payer: Self-pay | Admitting: Cardiovascular Disease

## 2023-12-02 IMAGING — CT CT ANGIO CHEST
2 of 5 series · 19 of 46 positions shown · IV contrast (Omni 300)
Comparison: 05/18/2021

CLINICAL DATA: Chest or back pain with aortic dissection suspected

EXAM:
CT ANGIOGRAPHY CHEST WITH CONTRAST
TECHNIQUE: Multidetector CT imaging of the chest was performed using the
standard protocol during bolus administration of intravenous
contrast. Multiplanar CT image reconstructions and MIPs were
obtained to evaluate the vascular anatomy.

[Series 5: thoracic cta 2mm · axial · 0.67mm/px · z∈[+1080,+1388]mm · 16 of 166 slices shown]
[im 6/166  lung]
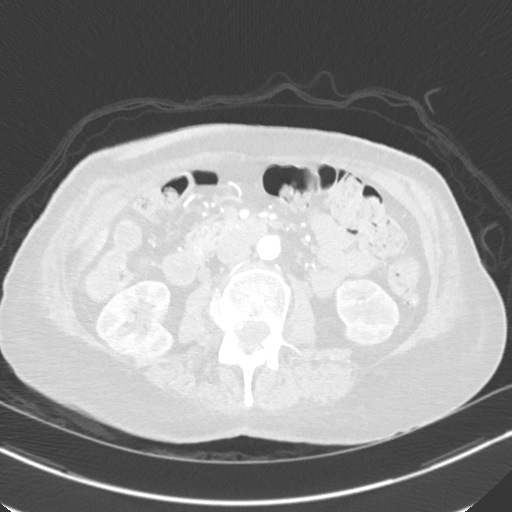
[im 16/166  soft-tissue]
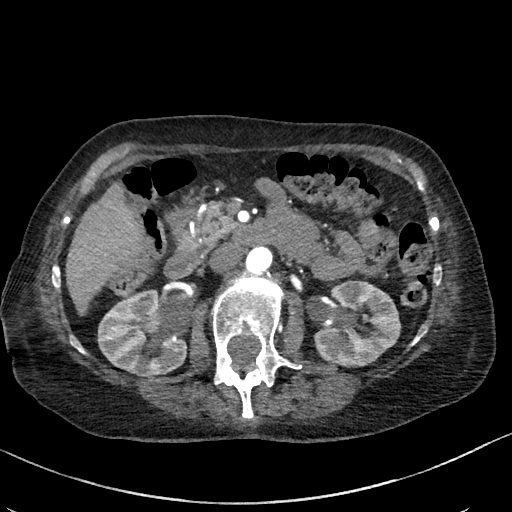
[im 27/166  lung]
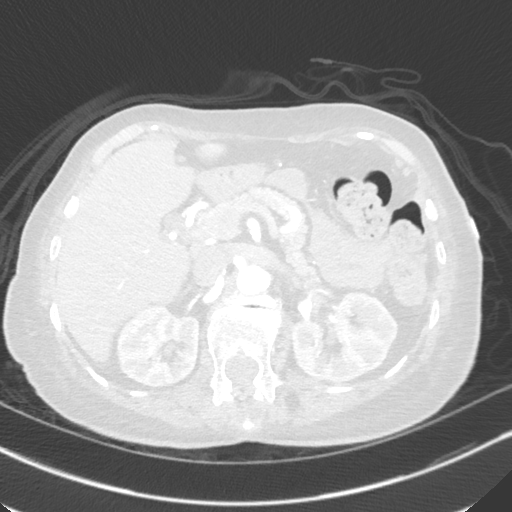
[im 38/166  soft-tissue]
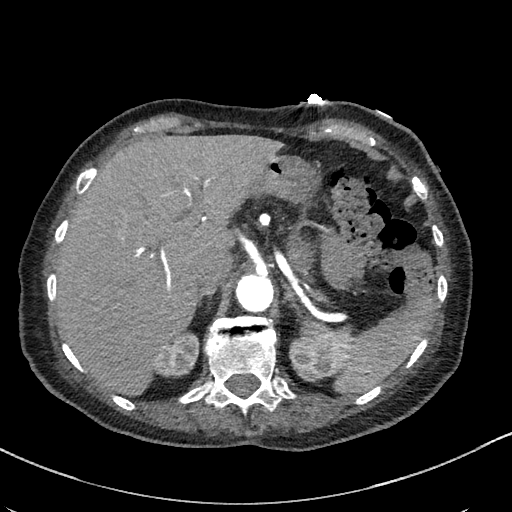
[im 48/166  lung]
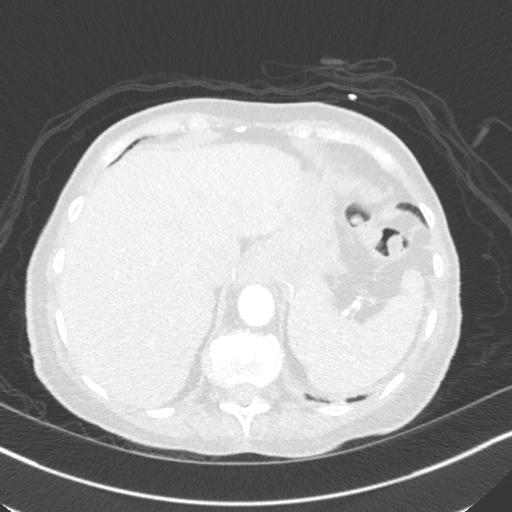
[im 59/166  soft-tissue]
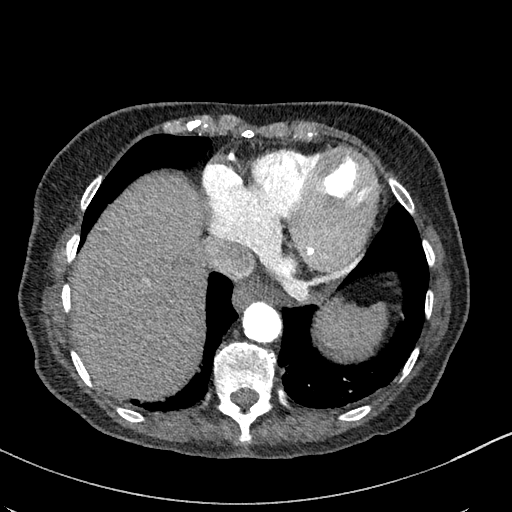
[im 70/166  lung]
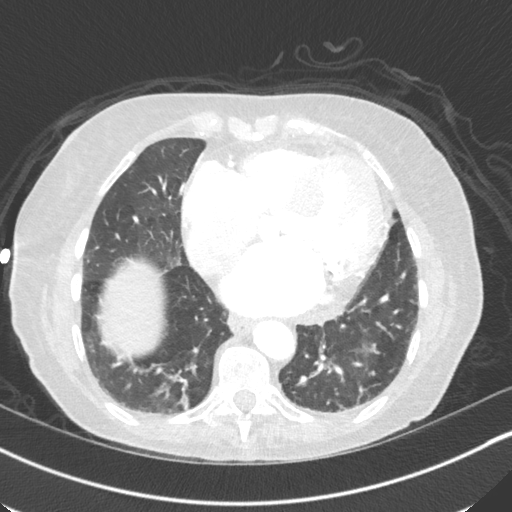
[im 80/166  soft-tissue]
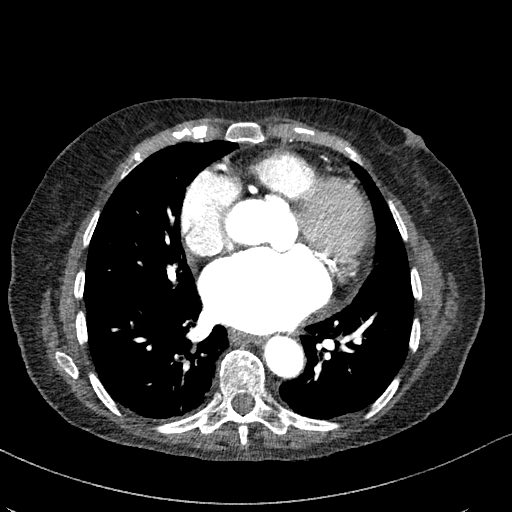
[im 86/166  lung]
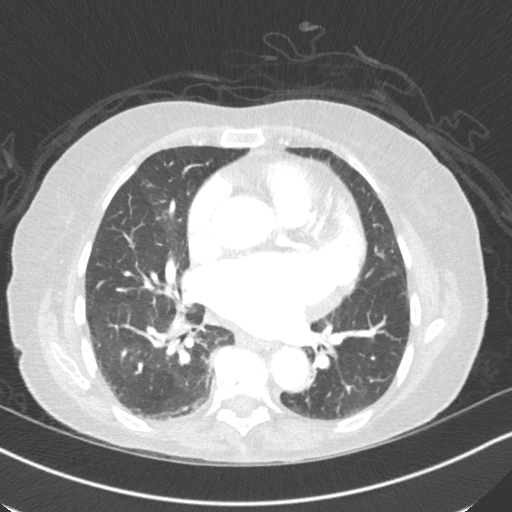
[im 96/166  soft-tissue]
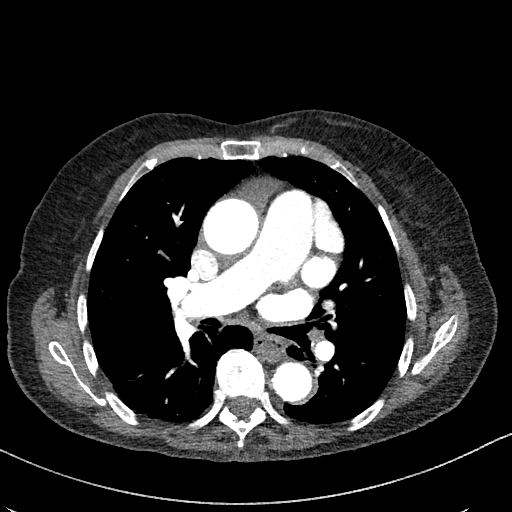
[im 107/166  lung]
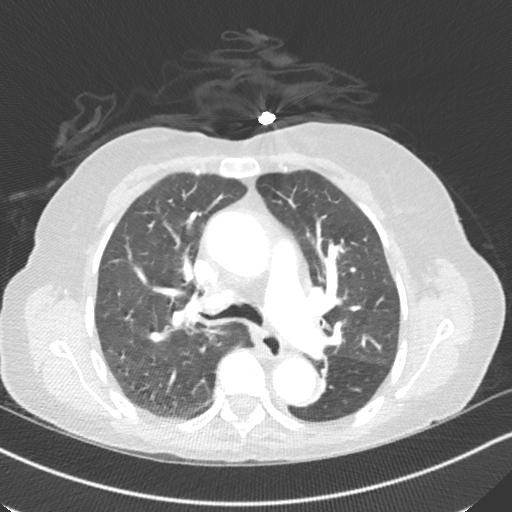
[im 118/166  soft-tissue]
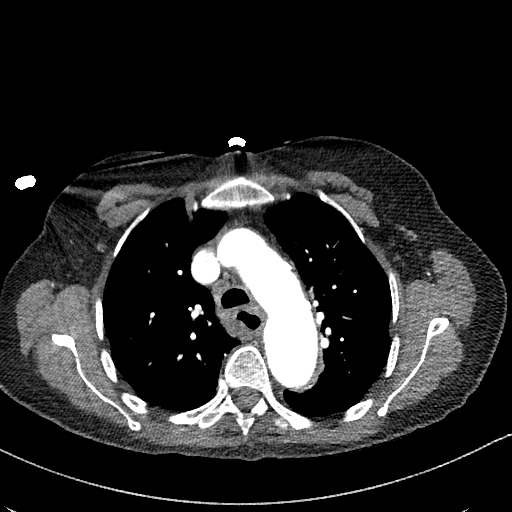
[im 128/166  lung]
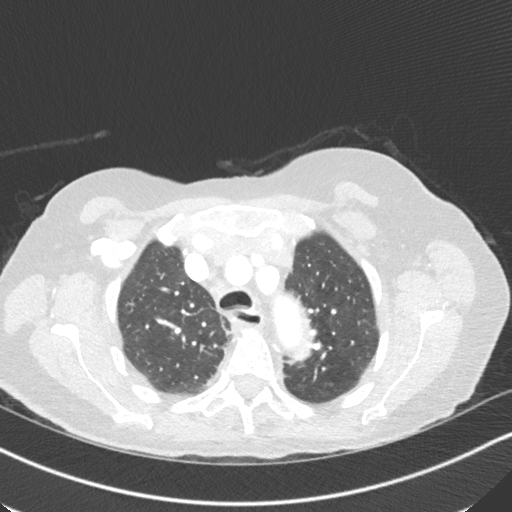
[im 139/166  soft-tissue]
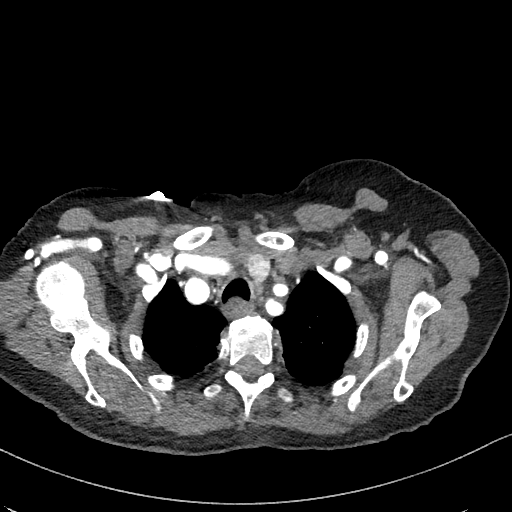
[im 150/166  lung]
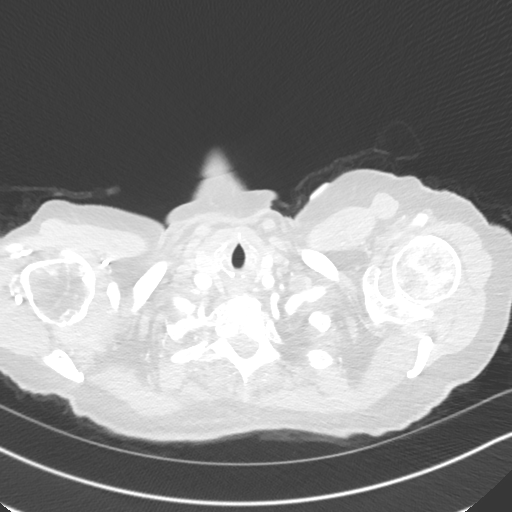
[im 160/166  soft-tissue]
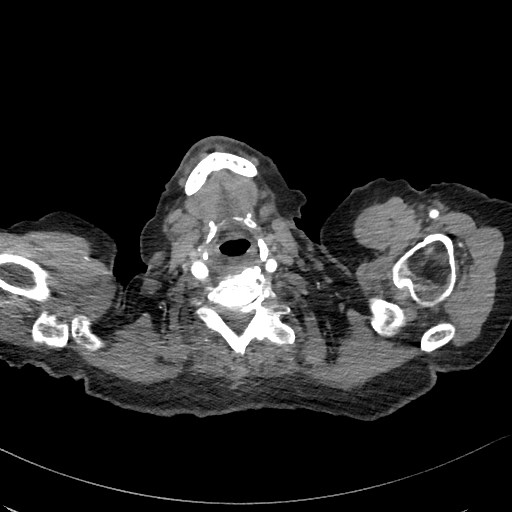

[Series 8: thoracic cta 2mm cor · coronal · 0.65mm/px · 3 of 146 slices shown]
[im 37/146  soft-tissue]
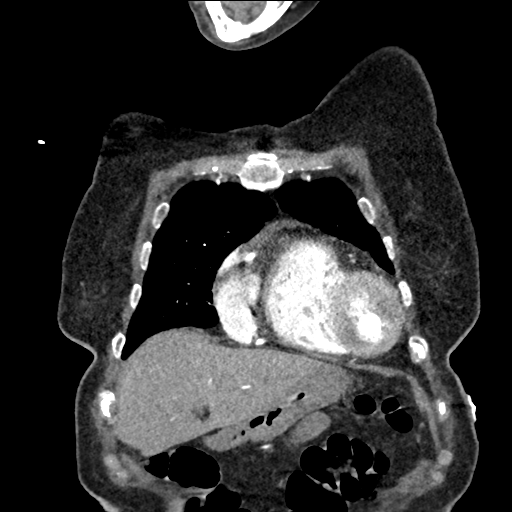
[im 73/146  soft-tissue]
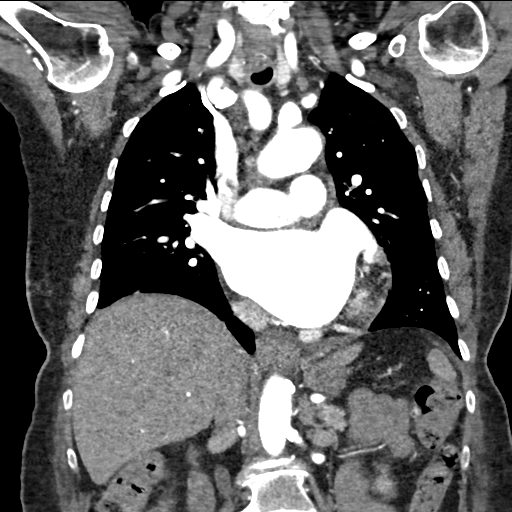
[im 109/146  soft-tissue]
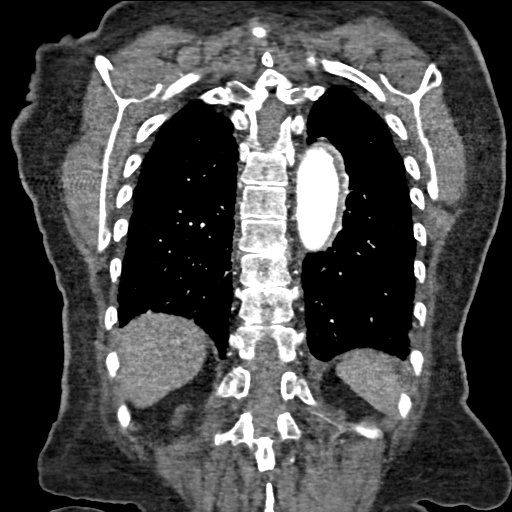

[19 of 46 positions shown; findings below may reference images not displayed]

RADIATION DOSE REDUCTION: This exam was performed according to the
departmental dose-optimization program which includes automated
exposure control, adjustment of the mA and/or kV according to
patient size and/or use of iterative reconstruction technique.

CONTRAST:  70mL OMNIPAQUE IOHEXOL 350 MG/ML SOLN
FINDINGS: Cardiovascular: Preferential opacification of the thoracic aorta. No
evidence of thoracic aortic aneurysm or dissection. Enlarged heart
size. Mitral annular calcification. LAD atheromatous calcification.
Atheromatous calcification of the aorta.

Mediastinum/Nodes: Negative for adenopathy or mass. Benign
heterogeneity of the thyroid.

Lungs/Pleura: Lungs are clear. No pleural effusion or pneumothorax.
Generalized airway thickening with intermittent collapse. Mild
subpleural scarring.

Upper Abdomen: Fullness of the bilateral intrarenal collecting
system similar to comparison.

1 cm simple cystic density in the pancreatic head considered
incidental for patient age.

Musculoskeletal: Generalized thoracic spine degeneration. Remote
fracture of the inferior left scapula.

Review of the MIP images confirms the above findings.
IMPRESSION: 1. No acute finding including thoracic acute aortic syndrome.
2. Cardiomegaly and atherosclerosis.

## 2023-12-02 NOTE — Telephone Encounter (Signed)
*  STAT* If patient is at the pharmacy, call can be transferred to refill team.   1. Which medications need to be refilled? (please list name of each medication and dose if known) apixaban  (ELIQUIS ) 5 MG TABS tablet   2. Which pharmacy/location (including street and city if local pharmacy) is medication to be sent to? [facility] - [place], [place] - [address] Phone: [phone]  Fax: [phone]     3. Do they need a 30 day or 90 day supply? 90  Pt is out of medication

## 2023-12-14 DIAGNOSIS — I5030 Unspecified diastolic (congestive) heart failure: Secondary | ICD-10-CM | POA: Diagnosis not present

## 2023-12-14 DIAGNOSIS — I10 Essential (primary) hypertension: Secondary | ICD-10-CM | POA: Diagnosis not present

## 2023-12-14 DIAGNOSIS — I421 Obstructive hypertrophic cardiomyopathy: Secondary | ICD-10-CM | POA: Diagnosis not present

## 2023-12-14 DIAGNOSIS — I482 Chronic atrial fibrillation, unspecified: Secondary | ICD-10-CM | POA: Diagnosis not present

## 2023-12-14 DIAGNOSIS — M069 Rheumatoid arthritis, unspecified: Secondary | ICD-10-CM | POA: Diagnosis not present

## 2023-12-14 DIAGNOSIS — E785 Hyperlipidemia, unspecified: Secondary | ICD-10-CM | POA: Diagnosis not present

## 2023-12-14 DIAGNOSIS — I340 Nonrheumatic mitral (valve) insufficiency: Secondary | ICD-10-CM | POA: Diagnosis not present

## 2023-12-14 DIAGNOSIS — Z79899 Other long term (current) drug therapy: Secondary | ICD-10-CM | POA: Diagnosis not present

## 2023-12-14 DIAGNOSIS — I5032 Chronic diastolic (congestive) heart failure: Secondary | ICD-10-CM | POA: Diagnosis not present

## 2024-02-01 DIAGNOSIS — M546 Pain in thoracic spine: Secondary | ICD-10-CM | POA: Diagnosis not present

## 2024-02-02 DIAGNOSIS — M546 Pain in thoracic spine: Secondary | ICD-10-CM | POA: Diagnosis not present

## 2024-02-04 DIAGNOSIS — M546 Pain in thoracic spine: Secondary | ICD-10-CM | POA: Diagnosis not present

## 2024-03-22 DIAGNOSIS — E785 Hyperlipidemia, unspecified: Secondary | ICD-10-CM | POA: Diagnosis not present

## 2024-03-22 DIAGNOSIS — I5032 Chronic diastolic (congestive) heart failure: Secondary | ICD-10-CM | POA: Diagnosis not present

## 2024-03-22 DIAGNOSIS — M069 Rheumatoid arthritis, unspecified: Secondary | ICD-10-CM | POA: Diagnosis not present

## 2024-03-22 DIAGNOSIS — I10 Essential (primary) hypertension: Secondary | ICD-10-CM | POA: Diagnosis not present

## 2024-03-22 DIAGNOSIS — I482 Chronic atrial fibrillation, unspecified: Secondary | ICD-10-CM | POA: Diagnosis not present

## 2024-03-22 DIAGNOSIS — I5030 Unspecified diastolic (congestive) heart failure: Secondary | ICD-10-CM | POA: Diagnosis not present

## 2024-03-22 DIAGNOSIS — I421 Obstructive hypertrophic cardiomyopathy: Secondary | ICD-10-CM | POA: Diagnosis not present

## 2024-03-22 DIAGNOSIS — I340 Nonrheumatic mitral (valve) insufficiency: Secondary | ICD-10-CM | POA: Diagnosis not present

## 2024-04-16 DIAGNOSIS — I8310 Varicose veins of unspecified lower extremity with inflammation: Secondary | ICD-10-CM | POA: Diagnosis not present

## 2024-06-07 ENCOUNTER — Telehealth: Payer: Self-pay | Admitting: Student in an Organized Health Care Education/Training Program

## 2024-06-07 NOTE — Telephone Encounter (Signed)
  1. Name of Medication: amLODipine  (NORVASC ) 2.5 MG tablet    2. How are you currently taking this medication (dosage and times per day)? As written    3. Are you having a reaction (difficulty breathing--STAT)? no  4. What is your medication issue?   Pt having red ankle reaction to med.

## 2024-06-07 NOTE — Telephone Encounter (Signed)
 Patient reports that she has had redness in her ankles for a while now. She wasn't sure if this was related to her amlodipine . She states that she is not having any swelling or pain in her ankles. She reports blood pressure has been well controlled. Advised patient to continue with amlodipine  and that I would check with PharmD for any concerns.

## 2024-06-10 ENCOUNTER — Emergency Department (HOSPITAL_COMMUNITY)

## 2024-06-10 ENCOUNTER — Other Ambulatory Visit: Payer: Self-pay

## 2024-06-10 ENCOUNTER — Emergency Department (HOSPITAL_COMMUNITY): Admission: EM | Admit: 2024-06-10 | Discharge: 2024-06-10 | Disposition: A | Discharge status: Home / Self Care

## 2024-06-10 ENCOUNTER — Encounter (HOSPITAL_COMMUNITY): Payer: Self-pay | Admitting: *Deleted

## 2024-06-10 DIAGNOSIS — Z79899 Other long term (current) drug therapy: Secondary | ICD-10-CM | POA: Diagnosis not present

## 2024-06-10 DIAGNOSIS — I10 Essential (primary) hypertension: Secondary | ICD-10-CM | POA: Diagnosis not present

## 2024-06-10 DIAGNOSIS — R42 Dizziness and giddiness: Secondary | ICD-10-CM | POA: Diagnosis present

## 2024-06-10 DIAGNOSIS — I4891 Unspecified atrial fibrillation: Secondary | ICD-10-CM | POA: Diagnosis not present

## 2024-06-10 DIAGNOSIS — Z7901 Long term (current) use of anticoagulants: Secondary | ICD-10-CM | POA: Insufficient documentation

## 2024-06-10 LAB — CBC
HCT: 45.5 % (ref 36.0–46.0)
Hemoglobin: 14.8 g/dL (ref 12.0–15.0)
MCH: 31.2 pg (ref 26.0–34.0)
MCHC: 32.5 g/dL (ref 30.0–36.0)
MCV: 95.8 fL (ref 80.0–100.0)
Platelets: 167 K/uL (ref 150–400)
RBC: 4.75 MIL/uL (ref 3.87–5.11)
RDW: 14 % (ref 11.5–15.5)
WBC: 6.5 K/uL (ref 4.0–10.5)
nRBC: 0 % (ref 0.0–0.2)

## 2024-06-10 LAB — COMPREHENSIVE METABOLIC PANEL WITH GFR
ALT: 21 U/L (ref 0–44)
AST: 21 U/L (ref 15–41)
Albumin: 3.4 g/dL — ABNORMAL LOW (ref 3.5–5.0)
Alkaline Phosphatase: 72 U/L (ref 38–126)
Anion gap: 10 (ref 5–15)
BUN: 16 mg/dL (ref 8–23)
CO2: 25 mmol/L (ref 22–32)
Calcium: 9.5 mg/dL (ref 8.9–10.3)
Chloride: 104 mmol/L (ref 98–111)
Creatinine, Ser: 0.81 mg/dL (ref 0.44–1.00)
GFR, Estimated: >60 mL/min (ref >60)
Glucose, Bld: 87 mg/dL (ref 70–99)
Potassium: 3.7 mmol/L (ref 3.5–5.1)
Sodium: 139 mmol/L (ref 135–145)
Total Bilirubin: 1.1 mg/dL (ref 0.0–1.2)
Total Protein: 6.4 g/dL — ABNORMAL LOW (ref 6.5–8.1)

## 2024-06-10 LAB — URINALYSIS, ROUTINE W REFLEX MICROSCOPIC
Bilirubin Urine: NEGATIVE
Glucose, UA: NEGATIVE mg/dL
Hgb urine dipstick: NEGATIVE
Ketones, ur: NEGATIVE mg/dL
Leukocytes,Ua: NEGATIVE
Nitrite: NEGATIVE
Protein, ur: NEGATIVE mg/dL
Specific Gravity, Urine: 1.005 (ref 1.005–1.030)
pH: 6 (ref 5.0–8.0)

## 2024-06-10 LAB — CBG MONITORING, ED: Glucose-Capillary: 115 mg/dL — ABNORMAL HIGH (ref 70–99)

## 2024-06-10 MED ORDER — SODIUM CHLORIDE 0.9 % IV BOLUS
500.0000 mL | Freq: Once | INTRAVENOUS | Status: AC
  Administered 2024-06-10: 500 mL via INTRAVENOUS

## 2024-06-10 MED ORDER — MECLIZINE HCL 25 MG PO TABS
12.5000 mg | ORAL_TABLET | Freq: Once | ORAL | Status: AC
  Administered 2024-06-10: 12.5 mg via ORAL
  Filled 2024-06-10: qty 1

## 2024-06-10 MED ORDER — MECLIZINE HCL 12.5 MG PO TABS: 12.5000 mg | ORAL_TABLET | Freq: Three times a day (TID) | ORAL | 0 refills | Status: AC | PRN

## 2024-06-10 NOTE — ED Notes (Signed)
 Patient transported to CT

## 2024-06-10 NOTE — ED Notes (Signed)
 PT ambulated to the bathroom to produce a urine with no complaints of dizziness.

## 2024-06-10 NOTE — Discharge Instructions (Signed)
 Your workup today was reassuring.  Please take the meclizine as needed for dizziness.  Follow-up with your doctor.  Return to the ER for worsening symptoms.

## 2024-06-10 NOTE — ED Triage Notes (Addendum)
 Patient bib son with complaints of hypertension and dizziness. She repotrs a slight headache, denies blurred vision, denies numbness and tingling in her body and denies nausea and vomiting. Patient reports bp was 137/70.

## 2024-06-10 NOTE — ED Provider Notes (Signed)
 [facility] EMERGENCY DEPARTMENT AT [facility] Provider Note   CSN: [id number] Arrival date & time: 06/10/24  1449     Patient presents with: Hypertension and Dizziness   Stacy [name] is a 89 y.o. female.   89 year old female with past medical history of atrial fibrillation on Eliquis  as well as hypertension presenting to the emergency department today with dizziness.  This been going on since Friday.  The patient has apparently had difficulty walking since then.  Her family states that she has been walking into the walls intermittently since then.  Her symptoms did not improve they were actually worse so she came to the emergency department further evaluation.  The patient denies any headache or unilateral weakness.  She states that this has happened before in the past and was related to atrial fibrillation.  She has been taking her medications as prescribed.  Reports that her diastolic blood pressures were a little elevated at home in the 90s.  She denies any chest pain, blood in her stool or dark stools.        Prior to Admission medications   Medication Sig Start Date End Date Taking? Authorizing Provider  meclizine  (ANTIVERT ) 12.5 MG tablet Take 1 tablet (12.5 mg total) by mouth 3 (three) times daily as needed for dizziness. 06/10/24  Yes Ula Prentice SAUNDERS, MD  acetaminophen  (TYLENOL ) 325 MG tablet Take 650 mg by mouth every 6 (six) hours as needed for moderate pain or headache.    [provider]  albuterol  (VENTOLIN  HFA) 108 (90 Base) MCG/ACT inhaler Inhale 2 puffs into the lungs every 6 (six) hours as needed for wheezing or shortness of breath. 12/14/22   [provider]  amiodarone  (PACERONE ) 200 MG tablet Take one tablet by mouth on Monday and Thursday 03/06/21   Nahser, Aleene PARAS, MD  amLODipine  (NORVASC ) 2.5 MG tablet Take 1 tablet (2.5 mg total) by mouth daily. 06/10/23   Nahser, Aleene PARAS, MD  apixaban  (ELIQUIS ) 5 MG TABS tablet Take 1 tablet (5 mg  total) by mouth 2 (two) times daily. 11/07/23   Nahser, Aleene PARAS, MD  Ascorbic Acid (VITAMIN C) 1000 MG tablet Take 1,000 mg by mouth daily.    [provider]  atorvastatin  (LIPITOR) 10 MG tablet Take 10 mg by mouth at bedtime. 03/11/23   [provider]  BIOTIN PO Take 1 capsule by mouth daily.    [provider]  Cholecalciferol (VITAMIN D-3 PO) Take 1 tablet by mouth daily.    [provider]  folic acid  (FOLVITE ) 1 MG tablet Take 3 mg by mouth daily.    [provider]  hydrALAZINE  (APRESOLINE ) 25 MG tablet Take 1 tablet 3 times daily if BP >140 Patient taking differently: Take 25 mg by mouth daily as needed (for sBP >140). 07/22/20   Nahser, Aleene PARAS, MD  irbesartan  (AVAPRO ) 300 MG tablet Take 1 tablet (300 mg total) by mouth daily. 08/25/23   Nahser, Aleene PARAS, MD  methotrexate 2.5 MG tablet Take 15 mg by mouth every Saturday. 10/01/21   [provider]  metoprolol  tartrate (LOPRESSOR ) 25 MG tablet TAKE ONE TABLET BY MOUTH TWICE DAILY 07/04/23   Nahser, Aleene PARAS, MD  Polyvinyl Alcohol-Povidone (REFRESH OP) Place 1 drop into both eyes as needed (dry eyes).    [provider]  zinc gluconate 50 MG tablet Take 50 mg by mouth daily.    [provider]    Allergies: Sulfa antibiotics    Review  of Systems  Neurological:  Positive for dizziness.  All other systems reviewed and are negative.   Updated Vital Signs BP 114/79   Pulse 65   Temp 97.8 F (36.6 C) (Oral)   Resp 18   Ht 5' (1.524 m)   Wt 61.2 kg   LMP  (LMP Unknown)   SpO2 97%   BMI 26.35 kg/m   Physical Exam Vitals and nursing note reviewed.   Gen: NAD Eyes: PERRL, EOMI HEENT: no oropharyngeal swelling Neck: trachea midline Resp: clear to auscultation bilaterally Card: RRR, no murmurs, rubs, or gallops Abd: nontender, nondistended Extremities: no calf tenderness, no edema Vascular: 2+ radial pulses bilaterally, 2+ DP pulses bilaterally Neuro:  Cranial nerves intact, equal strength sensationBilateral upper and lower extremities with no dysmetria on finger-nose testing, no appreciable nystagmus is noted Skin: no rashes Psyc: acting appropriately   (all labs ordered are listed, but only abnormal results are displayed) Labs Reviewed  COMPREHENSIVE METABOLIC PANEL WITH GFR - Abnormal; Notable for the following components:      Result Value   Total Protein 6.4 (*)    Albumin 3.4 (*)    All other components within normal limits  CBG MONITORING, ED - Abnormal; Notable for the following components:   Glucose-Capillary 115 (*)    All other components within normal limits  CBC  URINALYSIS, ROUTINE W REFLEX MICROSCOPIC    EKG: EKG Interpretation Date/Time:  Sunday June 10 2024 17:15:04 EST Ventricular Rate:  60 PR Interval:    QRS Duration:  105 QT Interval:  459 QTC Calculation: 459 R Axis:   46  Text Interpretation: Atrial fibrillation Confirmed by Ula Barter [phone]) on 06/10/2024 5:26:24 PM  Radiology: CT Head Wo Contrast Result Date: 06/10/2024 CLINICAL DATA:  Neuro deficit, acute, stroke suspected.  Dizziness. EXAM: CT HEAD WITHOUT CONTRAST TECHNIQUE: Contiguous axial images were obtained from the base of the skull through the vertex without intravenous contrast. RADIATION DOSE REDUCTION: This exam was performed according to the departmental dose-optimization program which includes automated exposure control, adjustment of the mA and/or kV according to patient size and/or use of iterative reconstruction technique. COMPARISON:  09/18/2022 FINDINGS: Brain: No identifiable acute finding. Advanced chronic small vessel ischemic change affecting the pons, thalami, basal ganglia and cerebral hemispheric white matter. No sign of cortically based large vessel territory stroke. No mass, hemorrhage, hydrocephalus or extra-axial collection. Vascular: There is atherosclerotic calcification of the major vessels at the base of the brain.  Skull: Negative Sinuses/Orbits: Clear/normal Other: None IMPRESSION: No acute CT finding. Advanced chronic small-vessel ischemic changes throughout the brain as outlined above. Electronically Signed   By: Oneil Officer M.D.   On: 06/10/2024 16:51     Procedures   Medications Ordered in the ED  sodium chloride  0.9 % bolus 500 mL (0 mLs Intravenous Stopped 06/10/24 1938)  meclizine  (ANTIVERT ) tablet 12.5 mg (12.5 mg Oral Given 06/10/24 1723)                                    Medical Decision Making 89 year old female with past medical history of atrial fibrillation and hypertension presenting to the emergency department today with dizziness.  I will further evaluate the patient here with basic labs to evaluate for anemia or electrolyte abnormalities.  Given her age we will obtain CT scan to eval for intracranial hemorrhage or mass lesion.  I will give the patient IV fluids here as well  as meclizine  in the event this is due to peripheral vertigo.  If her symptoms have not improved and she is still having some gait instability will pursue MRI.  I think that if her symptoms resolve that she could potentially be discharged as this has been a recurring issue for her.  She does not have any obvious findings of stroke on neurologic exam here and is clearly outside the window for any acute intervention at this time and does not have any symptoms consistent with large vessel occlusion at this time.  The patient's workup here is reassuring.  Her symptoms resolved with the fluids and medications here.  She is feeling much better on reassessment.  She is discharged with return precautions.  Amount and/or Complexity of Data Reviewed Radiology: ordered.        Final diagnoses:  Dizziness    ED Discharge Orders          Ordered    meclizine  (ANTIVERT ) 12.5 MG tablet  3 times daily PRN        06/10/24 2017               Ula Prentice SAUNDERS, MD 06/10/24 2017

## 2024-06-10 NOTE — ED Provider Triage Note (Signed)
 Emergency Medicine Provider Triage Evaluation Note  Stacy [name] , a 89 y.o. female  was evaluated in triage.  Pt complains of dizzy. Report persistent dizziness, lightheadedness x 3 days.  Sts she fell yesterday from dizziness, but denies any injury from the fall. No headache, cp, sob, abd pain, dysuria, focal numbness or focal weakness. No new medications  Review of Systems  Positive: As above Negative: As above  Physical Exam  BP 122/66   Pulse 72 Comment: sl irregular  Temp 97.6 F (36.4 C)   Resp 14   Ht 5' (1.524 m)   Wt 61.2 kg   LMP  (LMP Unknown)   SpO2 100%   BMI 26.35 kg/m  Gen:   Awake, no distress   Resp:  Normal effort  MSK:   Moves extremities without difficulty  Other:    Medical Decision Making  Medically screening exam initiated at 3:59 PM.  Appropriate orders placed.  [name] was informed that the remainder of the evaluation will be completed by another provider, this initial triage assessment does not replace that evaluation, and the importance of remaining in the ED until their evaluation is complete.     Nivia Colon, PA-C 06/10/24 [phone]

## 2024-06-11 NOTE — Telephone Encounter (Signed)
Spoke with the patient and advised on recommendations from PharmD. Patient verbalized understanding.  °

## 2024-06-11 NOTE — Telephone Encounter (Signed)
 Never heard that amlodipine  caused redness in ankles.  If not painful or swelling, she should be fine to continue.  If bothersome, suggest stopping to see if improves and trial with different medication.

## 2024-06-25 ENCOUNTER — Other Ambulatory Visit: Payer: Self-pay

## 2024-06-25 DIAGNOSIS — I480 Paroxysmal atrial fibrillation: Secondary | ICD-10-CM

## 2024-06-25 MED ORDER — APIXABAN 5 MG PO TABS: 5.0000 mg | ORAL_TABLET | Freq: Two times a day (BID) | ORAL | 1 refills | Status: AC

## 2024-06-25 NOTE — Telephone Encounter (Signed)
 Eliquis  5mg  refill request received. Patient is 89 years old, weight-61.2kg, Crea-0.81 on 06/10/24, Diagnosis-Afib, and last seen by Dr. Alveta on 09/19/23 and pending appt with Georganna Archer on 09/11/24. Dose is appropriate based on dosing criteria. Will send in refill to requested pharmacy.

## 2024-09-11 ENCOUNTER — Ambulatory Visit: Admitting: Student in an Organized Health Care Education/Training Program
